# Patient Record
Sex: Female | Born: 1945 | Race: White | Hispanic: No | Marital: Married | Smoking: Never smoker
Health system: Southern US, Community
[De-identification: ages and names within clinical notes are randomized; demographics above are authoritative.]

## PROBLEM LIST (undated history)

## (undated) DIAGNOSIS — E7800 Pure hypercholesterolemia, unspecified: Secondary | ICD-10-CM

## (undated) DIAGNOSIS — C50919 Malignant neoplasm of unspecified site of unspecified female breast: Secondary | ICD-10-CM

## (undated) DIAGNOSIS — E119 Type 2 diabetes mellitus without complications: Secondary | ICD-10-CM

## (undated) DIAGNOSIS — I10 Essential (primary) hypertension: Secondary | ICD-10-CM

## (undated) DIAGNOSIS — K219 Gastro-esophageal reflux disease without esophagitis: Secondary | ICD-10-CM

## (undated) DIAGNOSIS — Z9221 Personal history of antineoplastic chemotherapy: Secondary | ICD-10-CM

## (undated) DIAGNOSIS — Z923 Personal history of irradiation: Secondary | ICD-10-CM

## (undated) DIAGNOSIS — Z950 Presence of cardiac pacemaker: Secondary | ICD-10-CM

## (undated) DIAGNOSIS — G4730 Sleep apnea, unspecified: Secondary | ICD-10-CM

## (undated) DIAGNOSIS — N189 Chronic kidney disease, unspecified: Secondary | ICD-10-CM

## (undated) DIAGNOSIS — M1990 Unspecified osteoarthritis, unspecified site: Secondary | ICD-10-CM

## (undated) DIAGNOSIS — D649 Anemia, unspecified: Secondary | ICD-10-CM

## (undated) DIAGNOSIS — K449 Diaphragmatic hernia without obstruction or gangrene: Secondary | ICD-10-CM

## (undated) DIAGNOSIS — E039 Hypothyroidism, unspecified: Secondary | ICD-10-CM

## (undated) HISTORY — PX: BREAST LUMPECTOMY: SHX2

## (undated) HISTORY — PX: CHOLECYSTECTOMY: SHX55

## (undated) HISTORY — PX: BACK SURGERY: SHX140

## (undated) HISTORY — PX: CATARACT EXTRACTION: SUR2

## (undated) HISTORY — PX: TONSILLECTOMY: SUR1361

## (undated) HISTORY — PX: MASTECTOMY: SHX3

## (undated) HISTORY — PX: TUBAL LIGATION: SHX77

---

## 1990-12-09 DIAGNOSIS — C50919 Malignant neoplasm of unspecified site of unspecified female breast: Secondary | ICD-10-CM

## 1990-12-09 DIAGNOSIS — D649 Anemia, unspecified: Secondary | ICD-10-CM

## 1990-12-09 HISTORY — DX: Malignant neoplasm of unspecified site of unspecified female breast: C50.919

## 1990-12-09 HISTORY — DX: Anemia, unspecified: D64.9

## 2015-05-24 ENCOUNTER — Other Ambulatory Visit: Payer: Self-pay

## 2015-05-24 DIAGNOSIS — Z1231 Encounter for screening mammogram for malignant neoplasm of breast: Secondary | ICD-10-CM

## 2015-06-26 ENCOUNTER — Ambulatory Visit
Admission: RE | Admit: 2015-06-26 | Discharge: 2015-06-26 | Disposition: A | Discharge status: Home / Self Care | Payer: Medicare Other | Source: Ambulatory Visit

## 2015-06-26 DIAGNOSIS — Z1231 Encounter for screening mammogram for malignant neoplasm of breast: Secondary | ICD-10-CM

## 2016-06-10 ENCOUNTER — Other Ambulatory Visit: Payer: Self-pay | Admitting: Internal Medicine

## 2016-06-10 DIAGNOSIS — Z1231 Encounter for screening mammogram for malignant neoplasm of breast: Secondary | ICD-10-CM

## 2016-06-10 DIAGNOSIS — Z853 Personal history of malignant neoplasm of breast: Secondary | ICD-10-CM

## 2016-07-01 ENCOUNTER — Ambulatory Visit
Admission: RE | Admit: 2016-07-01 | Discharge: 2016-07-01 | Disposition: A | Discharge status: Home / Self Care | Payer: Medicare Other | Source: Ambulatory Visit | Attending: Internal Medicine | Admitting: Internal Medicine

## 2016-07-01 DIAGNOSIS — Z853 Personal history of malignant neoplasm of breast: Secondary | ICD-10-CM

## 2016-07-01 DIAGNOSIS — Z1231 Encounter for screening mammogram for malignant neoplasm of breast: Secondary | ICD-10-CM

## 2016-07-03 ENCOUNTER — Emergency Department (HOSPITAL_COMMUNITY): Payer: Medicare Other

## 2016-07-03 ENCOUNTER — Encounter (HOSPITAL_COMMUNITY): Payer: Self-pay

## 2016-07-03 ENCOUNTER — Emergency Department (HOSPITAL_COMMUNITY)
Admission: EM | Admit: 2016-07-03 | Discharge: 2016-07-03 | Disposition: A | Discharge status: Home / Self Care | Payer: Medicare Other | Attending: Emergency Medicine | Admitting: Emergency Medicine

## 2016-07-03 DIAGNOSIS — K449 Diaphragmatic hernia without obstruction or gangrene: Secondary | ICD-10-CM

## 2016-07-03 DIAGNOSIS — R1310 Dysphagia, unspecified: Secondary | ICD-10-CM | POA: Diagnosis not present

## 2016-07-03 DIAGNOSIS — Z7984 Long term (current) use of oral hypoglycemic drugs: Secondary | ICD-10-CM | POA: Insufficient documentation

## 2016-07-03 DIAGNOSIS — E119 Type 2 diabetes mellitus without complications: Secondary | ICD-10-CM | POA: Diagnosis not present

## 2016-07-03 DIAGNOSIS — R0789 Other chest pain: Secondary | ICD-10-CM

## 2016-07-03 DIAGNOSIS — I10 Essential (primary) hypertension: Secondary | ICD-10-CM | POA: Diagnosis not present

## 2016-07-03 HISTORY — DX: Diaphragmatic hernia without obstruction or gangrene: K44.9

## 2016-07-03 HISTORY — DX: Pure hypercholesterolemia, unspecified: E78.00

## 2016-07-03 HISTORY — DX: Type 2 diabetes mellitus without complications: E11.9

## 2016-07-03 HISTORY — DX: Essential (primary) hypertension: I10

## 2016-07-03 HISTORY — DX: Sleep apnea, unspecified: G47.30

## 2016-07-03 LAB — CBC
HEMATOCRIT: 35.2 % — AB (ref 36.0–46.0)
HEMOGLOBIN: 11.5 g/dL — AB (ref 12.0–15.0)
MCH: 30.3 pg (ref 26.0–34.0)
MCHC: 32.7 g/dL (ref 30.0–36.0)
MCV: 92.9 fL (ref 78.0–100.0)
Platelets: 249 10*3/uL (ref 150–400)
RBC: 3.79 MIL/uL — AB (ref 3.87–5.11)
RDW: 12.7 % (ref 11.5–15.5)
WBC: 10 10*3/uL (ref 4.0–10.5)

## 2016-07-03 LAB — BASIC METABOLIC PANEL
ANION GAP: 8 (ref 5–15)
BUN: 31 mg/dL — AB (ref 6–20)
CHLORIDE: 103 mmol/L (ref 101–111)
CO2: 22 mmol/L (ref 22–32)
Calcium: 9.1 mg/dL (ref 8.9–10.3)
Creatinine, Ser: 1.61 mg/dL — ABNORMAL HIGH (ref 0.44–1.00)
GFR calc Af Amer: 36 mL/min — ABNORMAL LOW (ref >60)
GFR calc non Af Amer: 31 mL/min — ABNORMAL LOW (ref >60)
Glucose, Bld: 171 mg/dL — ABNORMAL HIGH (ref 65–99)
POTASSIUM: 4.6 mmol/L (ref 3.5–5.1)
SODIUM: 133 mmol/L — AB (ref 135–145)

## 2016-07-03 LAB — I-STAT TROPONIN, ED: Troponin i, poc: 0 ng/mL (ref 0.00–0.08)

## 2016-07-03 MED ORDER — PANTOPRAZOLE SODIUM 40 MG PO TBEC: 40.0000 mg | DELAYED_RELEASE_TABLET | Freq: Two times a day (BID) | ORAL | 0 refills | Status: DC

## 2016-07-03 NOTE — ED Notes (Signed)
Pt remains in xray.

## 2016-07-03 NOTE — ED Provider Notes (Signed)
I have reviewed the findings with Dr. Manya Silvas. The patient is to call [facility] in the morning and they will arrange to see the patient within 1-2 days for recheck. The patient is aware of the plan and agreeable. She is aware of signs and symptoms for which return. I prescribed for Protonix 40 mg twice a day.   Charlesetta Shanks, MD 07/03/16 [phone]

## 2016-07-03 NOTE — ED Notes (Signed)
Pt back from x-ray.

## 2016-07-03 NOTE — ED Provider Notes (Signed)
[facility] DEPT Provider Note   CSN: [id number] Arrival date & time: 07/03/16  1121  First Provider Contact:  First MD Initiated Contact with Patient 07/03/16 1250        History   Chief Complaint Chief Complaint  Patient presents with  . Chest Pain    HPI Kristin [name] is a 70 y.o. female.  HPI Patient presents emergency department with complaints of a sensation of fullness in her anterior chest as well as discomfort only when she swallows both liquids and solids over the past several days.  She was evaluated in urgent care and they're concerned about the possibility of an abnormal EKG and thus they sent her to the emergency department for further evaluation.  Patient does have a history of diabetes and hypertension as well as hyperlipidemia but no known history of coronary artery disease.  She does have a known hiatal hernia but reports she's never been very symptomatic from this.  She reports that her discomfort is only present when she swallows.  She has no exertional chest pain or shortness of breath.  She denies nausea vomiting.  No recent injury or trauma.  She denies fevers and chills.  Denies abdominal pain.  She's never had an endoscopy.  She's never had an esophageal dilation.  When she was initially told that she had a hiatal hernia she was started on Protonix which at the time did improve the minimal symptoms she had.   Past Medical History:  Diagnosis Date  . Diabetes mellitus without complication ([place])   . Hiatal hernia   . High cholesterol   . Hypertension   . Sleep apnea     There are no active problems to display for this patient.   History reviewed. No pertinent surgical history.  OB History    No data available       Home Medications    Prior to Admission medications   Medication Sig Start Date End Date Taking? Authorizing Provider  acetaminophen (TYLENOL) 325 MG tablet Take 650 mg by mouth every 6 (six) hours as needed for headache (pain).   Yes  Historical Provider, MD  amLODipine (NORVASC) 5 MG tablet Take 5 mg by mouth daily.   Yes Historical Provider, MD  atorvastatin (LIPITOR) 40 MG tablet Take 40 mg by mouth daily.   Yes Historical Provider, MD  cholecalciferol (VITAMIN D) 400 units TABS tablet Take 400 Units by mouth daily.   Yes Historical Provider, MD  fluticasone (FLONASE) 50 MCG/ACT nasal spray Place 1 spray into both nostrils daily.   Yes Historical Provider, MD  furosemide (LASIX) 20 MG tablet Take 10 mg by mouth daily as needed for edema.   Yes Historical Provider, MD  gabapentin (NEURONTIN) 300 MG capsule Take 600 mg by mouth at bedtime.   Yes Historical Provider, MD  lisinopril (PRINIVIL,ZESTRIL) 30 MG tablet Take 30 mg by mouth 2 (two) times daily.   Yes Historical Provider, MD  metFORMIN (GLUCOPHAGE) 500 MG tablet Take 1,000 mg by mouth 2 (two) times daily with a meal.   Yes Historical Provider, MD  metoprolol (LOPRESSOR) 50 MG tablet Take 50 mg by mouth 2 (two) times daily.   Yes Historical Provider, MD  Multiple Vitamins-Minerals (MULTIVITAMIN ADULT PO) Take by mouth.   Yes Historical Provider, MD  pantoprazole (PROTONIX) 40 MG tablet Take 40 mg by mouth daily.   Yes Historical Provider, MD  sitaGLIPtin (JANUVIA) 25 MG tablet Take 25 mg by mouth daily.   Yes Historical Provider, MD  zolpidem (AMBIEN) 10 MG tablet Take 10 mg by mouth at bedtime.   Yes Historical Provider, MD    Family History No family history on file.  Social History Social History  Substance Use Topics  . Smoking status: Never Smoker  . Smokeless tobacco: Never Used  . Alcohol use Not on file     Allergies   Ibuprofen and Levodopa   Review of Systems Review of Systems  All other systems reviewed and are negative.    Physical Exam Updated Vital Signs BP 131/75   Pulse 73   Temp 98.9 F (37.2 C) (Oral)   Resp 18   Ht 5\' 5"  (1.651 m)   Wt 269 lb (122 kg)   SpO2 99%   BMI 44.76 kg/m   Physical Exam  Constitutional: She is  oriented to person, place, and time. She appears well-developed and well-nourished. No distress.  HENT:  Head: Normocephalic and atraumatic.  Eyes: EOM are normal.  Neck: Normal range of motion.  Cardiovascular: Normal rate, regular rhythm and normal heart sounds.   Pulmonary/Chest: Effort normal and breath sounds normal.  Abdominal: Soft. She exhibits no distension. There is no tenderness.  Musculoskeletal: Normal range of motion.  Neurological: She is alert and oriented to person, place, and time.  Skin: Skin is warm and dry.  Psychiatric: She has a normal mood and affect. Judgment normal.  Nursing note and vitals reviewed.    ED Treatments / Results  Labs (all labs ordered are listed, but only abnormal results are displayed) Labs Reviewed  BASIC METABOLIC PANEL - Abnormal; Notable for the following:       Result Value   Sodium 133 (*)    Glucose, Bld 171 (*)    BUN 31 (*)    Creatinine, Ser 1.61 (*)    GFR calc non Af Amer 31 (*)    GFR calc Af Amer 36 (*)    All other components within normal limits  CBC - Abnormal; Notable for the following:    RBC 3.79 (*)    Hemoglobin 11.5 (*)    HCT 35.2 (*)    All other components within normal limits  I-STAT TROPOININ, ED    EKG  EKG Interpretation  Date/Time:  Wednesday July 03 2016 11:47:16 EDT Ventricular Rate:  86 PR Interval:  208 QRS Duration: 126 QT Interval:  386 QTC Calculation: 461 R Axis:   -48 Text Interpretation:  Sinus rhythm with occasional Premature ventricular complexes Left axis deviation Left ventricular hypertrophy with QRS widening Nonspecific T wave abnormality Abnormal ECG No old tracing to compare Confirmed by Lenka Zhao  MD, Lennette Bihari ([id number]) on 07/03/2016 12:48:47 PM       Radiology Dg Chest 2 View  Result Date: 07/03/2016 CLINICAL DATA:  Chest tightness for 24 hours. EXAM: CHEST  2 VIEW COMPARISON:  None. FINDINGS: Asymmetric elevation right hemidiaphragm. The lungs are clear wiithout focal  pneumonia, edema, pneumothorax or pleural effusion. Tiny nodule seen over the left upper lobemay be a granuloma given the apparent density relative to its size. The cardiopericardial silhouette is within normal limits for size. The visualized bony structures of the thorax are intact. IMPRESSION: Left upper lobe pulmonary nodule may be a granuloma. CT chest without contrast recommended to confirm. Electronically Signed   By: Misty Stanley M.D.   On: 07/03/2016 12:37  Ct Chest Wo Contrast  Result Date: 07/03/2016 CLINICAL DATA:  Lung nodule seen on prior chest x-ray. EXAM: CT CHEST WITHOUT CONTRAST TECHNIQUE: Multidetector CT imaging  of the chest was performed following the standard protocol without IV contrast. COMPARISON:  Radiograph of same day. FINDINGS: Cardiovascular: Coronary artery calcifications are noted. Normal cardiac size. Mediastinum/Nodes: No mediastinal mass or adenopathy is noted. Lungs/Pleura: No pneumothorax or pleural effusion is noted. No acute pulmonary disease is noted. Small calcified granulomata are noted bilaterally, with the largest in the left upper lobe which corresponds to abnormality seen on prior chest radiograph. Upper Abdomen: Large hiatal hernia is noted. Status post cholecystectomy. 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 IMPRESSION: Small calcified granulomata are noted bilaterally, with the largest in a left upper lobe which corresponds to abnormality seen on prior chest radiograph. Large hiatal hernia is noted. Coronary artery calcifications are noted suggesting Coronary artery disease. No other abnormality seen in the chest. Electronically Signed   By: Marijo Conception, M.D.   On: 07/03/2016 13:58   Procedures Procedures (including critical care time)  Medications Ordered in ED Medications - No data to display   Initial Impression / Assessment and Plan / ED Course  I have reviewed the triage vital signs and the nursing  notes.  Pertinent labs & imaging results that were available during my care of the patient were reviewed by me and considered in my medical decision making (see chart for details).  Clinical Course   Overall the patient is well-appearing.  I have a very low suspicion that this is acute coronary syndrome.  This sounds to be more of an esophageal issue as the pain and discomfort only comes with swallowing.  I had her drink water in front of me and as she was swallowing the water she felt discomfort which she points to the mid part of her sternum which then resolved 1-2 seconds later after she felt the fluid go down.  This sounds like this may be esophagitis versus a developing esophageal stricture.  This also could definitely represent a symptomatic hiatal hernia.  On initial plain film of her chest a small pulmonary nodule was noted and she does have a history of left-sided breast cancer treated back in the early 90s.  Because of this a CT scan of her chest was performed which demonstrates a large hiatal hernia but otherwise a granuloma which corresponds pulmonary nodule noted on chest x-ray.  No other adenopathy was noted.  Patient will undergo esophagram in the emergency department for further evaluation as this will better define what her diet will need to be until she can follow-up with GI.  For esophagus is wide open she can have a normal diet in the discomfort and pain is likely more coming from a symptomatic hiatal hernia.  She'll be given outpatient follow-up for surgery as well as for GI.  She does have a primary care physician in the community.  Overall she is very well-appearing.  She is able to keep down fluids without difficulty in the emergency department  Care to Dr Johnney Killian to follow up on Esophagram. Family updated  Final Clinical Impressions(s) / ED Diagnoses   Final diagnoses:  Dysphagia    New Prescriptions New Prescriptions   No medications on file     Jola Schmidt,  MD 07/03/16 1712

## 2016-07-03 NOTE — ED Notes (Signed)
Patient transported to CT 

## 2016-07-03 NOTE — ED Notes (Signed)
Wheeled pt back to room from waiting room. 

## 2016-07-03 NOTE — ED Notes (Signed)
Pt ambulated to restroom from room, pt tolerated well.

## 2016-07-03 NOTE — ED Notes (Signed)
Patient able to ambulate independently  

## 2017-06-04 ENCOUNTER — Encounter (HOSPITAL_BASED_OUTPATIENT_CLINIC_OR_DEPARTMENT_OTHER): Payer: Self-pay | Admitting: *Deleted

## 2017-06-04 ENCOUNTER — Emergency Department (HOSPITAL_BASED_OUTPATIENT_CLINIC_OR_DEPARTMENT_OTHER): Payer: Medicare Other

## 2017-06-04 ENCOUNTER — Emergency Department (HOSPITAL_BASED_OUTPATIENT_CLINIC_OR_DEPARTMENT_OTHER)
Admission: EM | Admit: 2017-06-04 | Discharge: 2017-06-05 | Disposition: A | Discharge status: Home / Self Care | Payer: Medicare Other | Attending: Emergency Medicine | Admitting: Emergency Medicine

## 2017-06-04 DIAGNOSIS — Z79899 Other long term (current) drug therapy: Secondary | ICD-10-CM | POA: Insufficient documentation

## 2017-06-04 DIAGNOSIS — S00511A Abrasion of lip, initial encounter: Secondary | ICD-10-CM | POA: Insufficient documentation

## 2017-06-04 DIAGNOSIS — E119 Type 2 diabetes mellitus without complications: Secondary | ICD-10-CM | POA: Diagnosis not present

## 2017-06-04 DIAGNOSIS — Y92520 Airport as the place of occurrence of the external cause: Secondary | ICD-10-CM | POA: Insufficient documentation

## 2017-06-04 DIAGNOSIS — Z853 Personal history of malignant neoplasm of breast: Secondary | ICD-10-CM | POA: Insufficient documentation

## 2017-06-04 DIAGNOSIS — I10 Essential (primary) hypertension: Secondary | ICD-10-CM | POA: Diagnosis not present

## 2017-06-04 DIAGNOSIS — Y999 Unspecified external cause status: Secondary | ICD-10-CM | POA: Insufficient documentation

## 2017-06-04 DIAGNOSIS — W010XXA Fall on same level from slipping, tripping and stumbling without subsequent striking against object, initial encounter: Secondary | ICD-10-CM | POA: Insufficient documentation

## 2017-06-04 DIAGNOSIS — S59911A Unspecified injury of right forearm, initial encounter: Secondary | ICD-10-CM | POA: Diagnosis present

## 2017-06-04 DIAGNOSIS — Y939 Activity, unspecified: Secondary | ICD-10-CM | POA: Insufficient documentation

## 2017-06-04 DIAGNOSIS — T1490XA Injury, unspecified, initial encounter: Secondary | ICD-10-CM

## 2017-06-04 DIAGNOSIS — Z7984 Long term (current) use of oral hypoglycemic drugs: Secondary | ICD-10-CM | POA: Diagnosis not present

## 2017-06-04 DIAGNOSIS — S52121A Displaced fracture of head of right radius, initial encounter for closed fracture: Secondary | ICD-10-CM | POA: Insufficient documentation

## 2017-06-04 HISTORY — DX: Malignant neoplasm of unspecified site of unspecified female breast: C50.919

## 2017-06-04 NOTE — ED Triage Notes (Signed)
[place] with right upper arm pain  X 1 hr ago

## 2017-06-05 DIAGNOSIS — S52121A Displaced fracture of head of right radius, initial encounter for closed fracture: Secondary | ICD-10-CM | POA: Diagnosis not present

## 2017-06-05 IMAGING — CT CT CHEST W/O CM
2 of 4 series · 15 of 36 positions shown, 18 images · non-contrast
Comparison: Radiograph of same day.

CLINICAL DATA: Lung nodule seen on prior chest x-ray.

EXAM:
CT CHEST WITHOUT CONTRAST
TECHNIQUE: Multidetector CT imaging of the chest was performed following the
standard protocol without IV contrast.

[Series 3: thorax 2.0 · axial · 0.90mm/px · z∈[-262,+44]mm · 12 of 173 slices shown, 15 images]
[im 10/173  mediastinal]
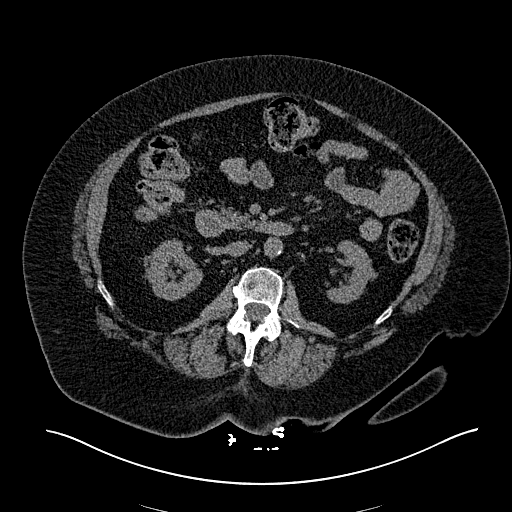
[im 10/173  lung]
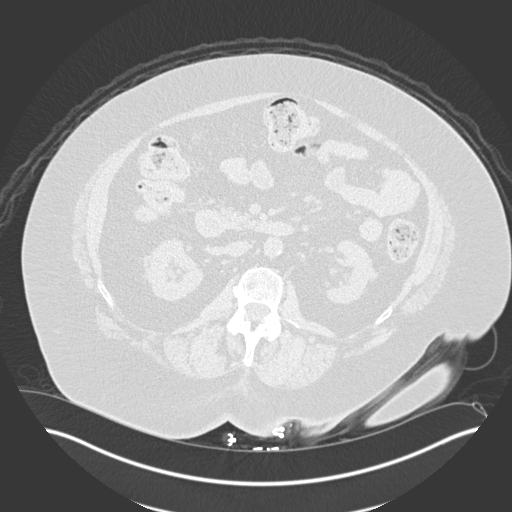
[im 28/173  lung]
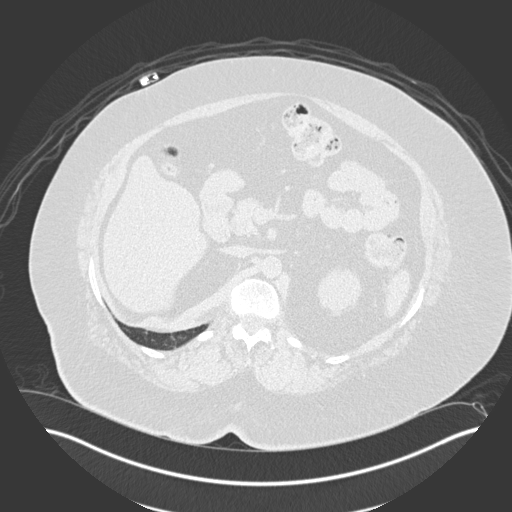
[im 37/173  lung]
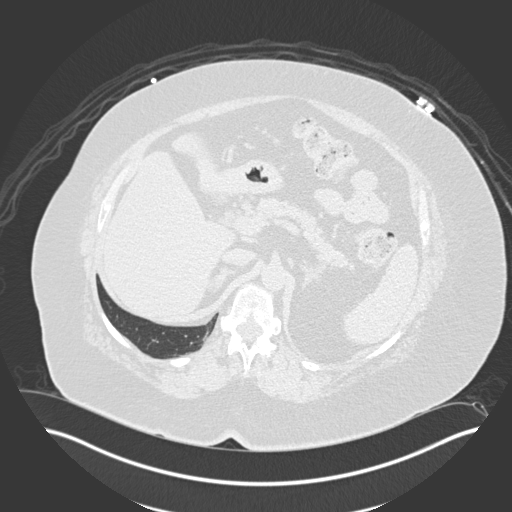
[im 55/173  lung]
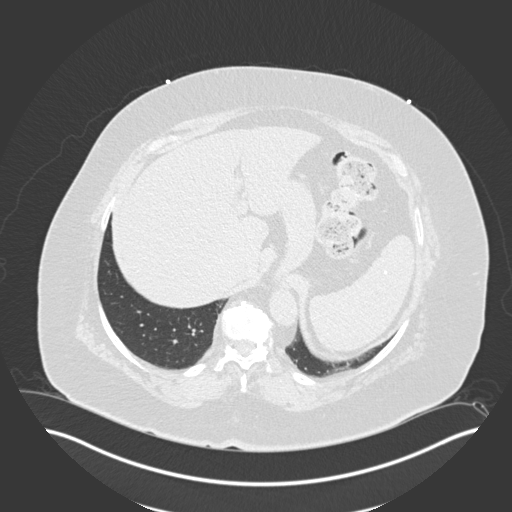
[im 64/173  mediastinal]
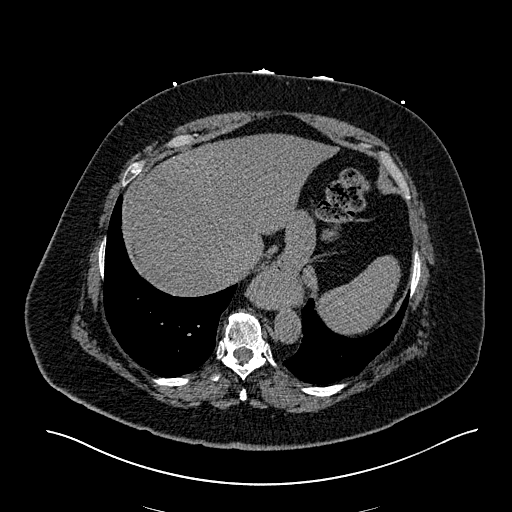
[im 64/173  lung]
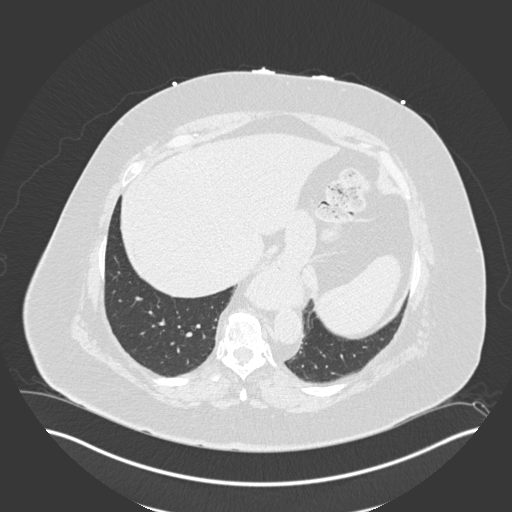
[im 82/173  lung]
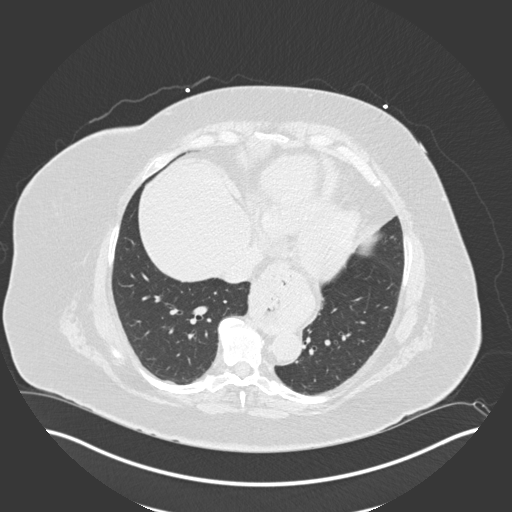
[im 91/173  lung]
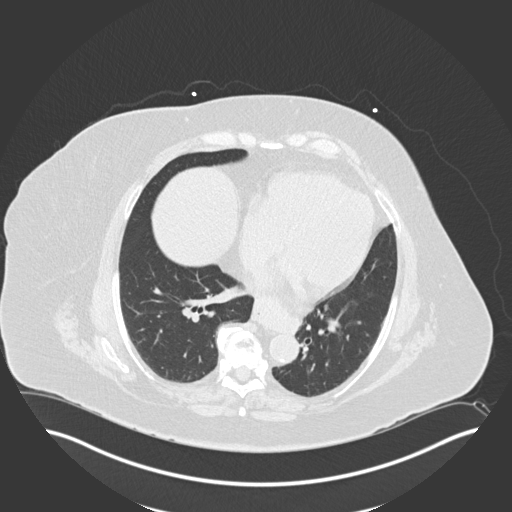
[im 109/173  lung]
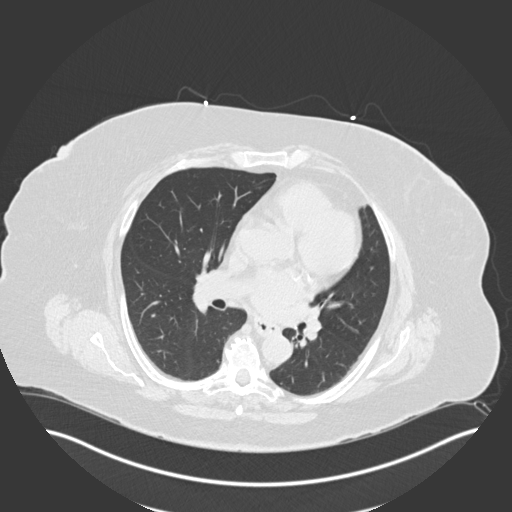
[im 118/173  mediastinal]
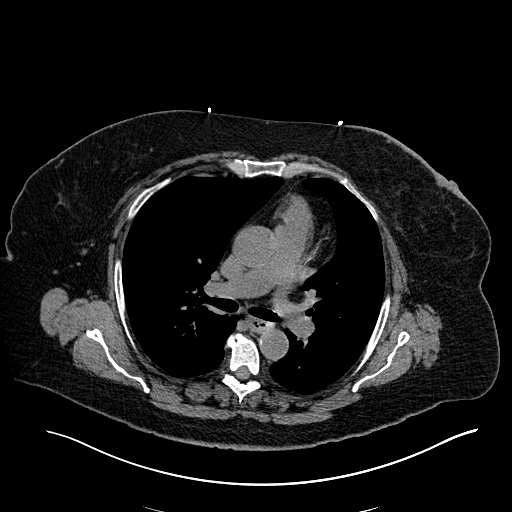
[im 118/173  lung]
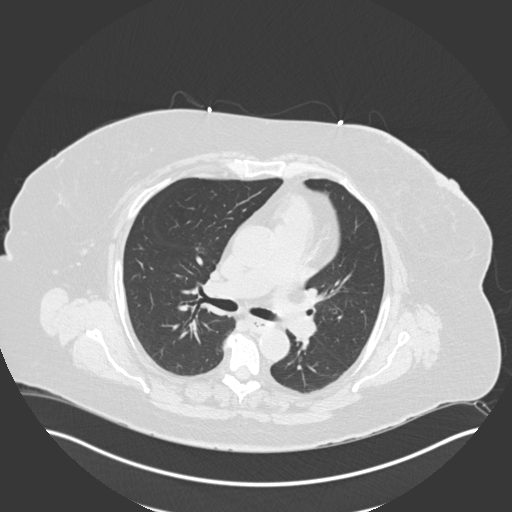
[im 136/173  lung]
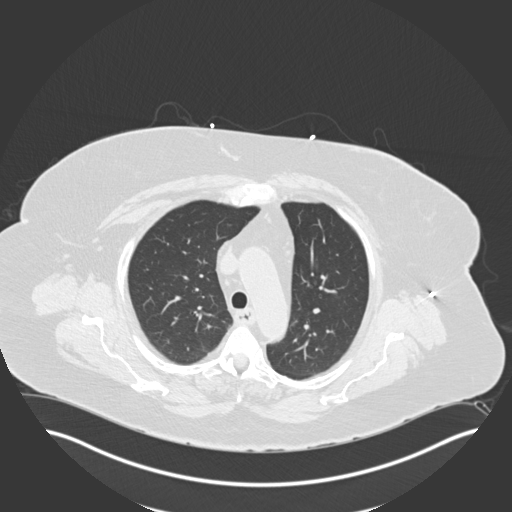
[im 145/173  lung]
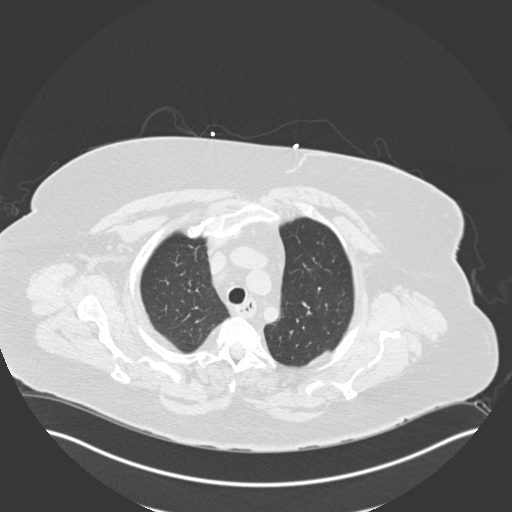
[im 163/173  lung]
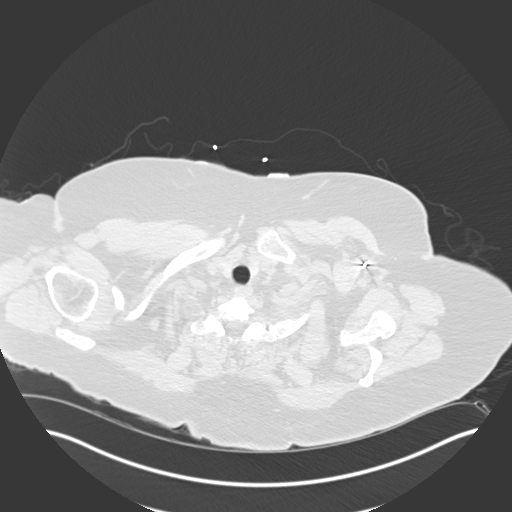

[Series 5: coronal · coronal · 0.68mm/px · 3 of 101 slices shown]
[im 21/101  lung]
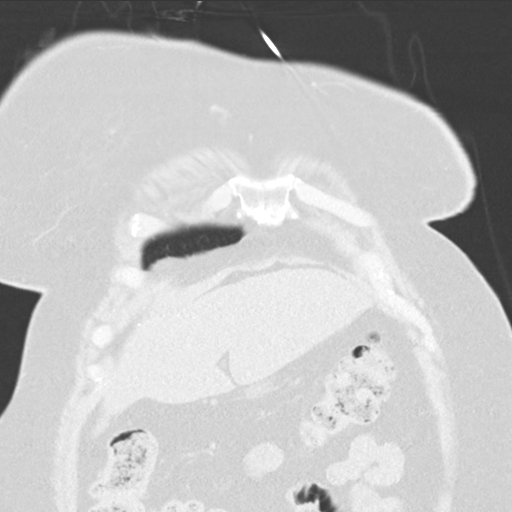
[im 41/101  lung]
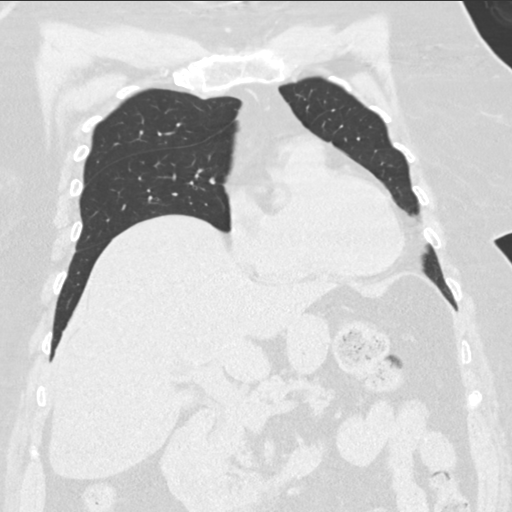
[im 61/101  lung]
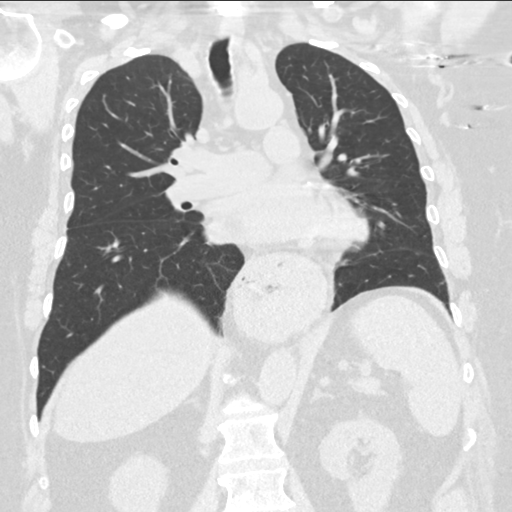

[15 of 36 positions shown; findings below may reference images not displayed]

FINDINGS: Cardiovascular: Coronary artery calcifications are noted. Normal
cardiac size.

Mediastinum/Nodes: No mediastinal mass or adenopathy is noted.

Lungs/Pleura: No pneumothorax or pleural effusion is noted. No acute
pulmonary disease is noted. Small calcified granulomata are noted
bilaterally, with the largest in the left upper lobe which
corresponds to abnormality seen on prior chest radiograph.

Upper Abdomen: Large hiatal hernia is noted. Status post
cholecystectomy.

Musculoskeletal: Multilevel degenerative disc disease is noted in
the thoracic spine.
IMPRESSION: Small calcified granulomata are noted bilaterally, with the largest
in a left upper lobe which corresponds to abnormality seen on prior
chest radiograph.

Large hiatal hernia is noted.

Coronary artery calcifications are noted suggesting Coronary artery
disease.

No other abnormality seen in the chest.

## 2017-06-05 MED ORDER — HYDROCODONE-ACETAMINOPHEN 5-325 MG PO TABS: 1.0000 | ORAL_TABLET | Freq: Four times a day (QID) | ORAL | 0 refills | Status: DC | PRN

## 2017-06-05 MED ORDER — HYDROCODONE-ACETAMINOPHEN 5-325 MG PO TABS
1.0000 | ORAL_TABLET | Freq: Once | ORAL | Status: AC
  Administered 2017-06-05: 1 via ORAL
  Filled 2017-06-05: qty 1

## 2017-06-05 NOTE — ED Provider Notes (Signed)
[facility] Provider Note: Georgena Spurling, MD, FACEP  CSN: [id number] MRN: [id number] ARRIVAL: 06/04/17 at 2328 ROOM: [place]  Arm Injury   HISTORY OF PRESENT ILLNESS  Kristin [name] is a 71 y.o. female who [place] and fell at the airport about an hour prior to arrival. She fell onto her right arm. She is having 6 out of 10 pain in her right arm just proximal to the right elbow. Pain is worse with palpation or movement, most notably supination and pronation. She denies wrist pain. Her only other injury is a superficial abrasion to her mid upper lip.   Past Medical History:  Diagnosis Date  . Breast CA ([place])   . Diabetes mellitus without complication ([place])   . Hiatal hernia   . High cholesterol   . Hypertension   . Sleep apnea     Past Surgical History:  Procedure Laterality Date  . BACK SURGERY    . BREAST LUMPECTOMY    . CHOLECYSTECTOMY    . TONSILLECTOMY    . TUBAL LIGATION      History reviewed. No pertinent family history.  Social History  Substance Use Topics  . Smoking status: Never Smoker  . Smokeless tobacco: Never Used  . Alcohol use No    Prior to Admission medications   Medication Sig Start Date End Date Taking? Authorizing Provider  acetaminophen (TYLENOL) 325 MG tablet Take 650 mg by mouth every 6 (six) hours as needed for headache (pain).    [provider]  amLODipine (NORVASC) 5 MG tablet Take 5 mg by mouth daily.    [provider]  atorvastatin (LIPITOR) 40 MG tablet Take 40 mg by mouth daily.    [provider]  cholecalciferol (VITAMIN D) 400 units TABS tablet Take 400 Units by mouth daily.    [provider]  fluticasone (FLONASE) 50 MCG/ACT nasal spray Place 1 spray into both nostrils daily.    [provider]  furosemide (LASIX) 20 MG tablet Take 10 mg by mouth daily as needed for edema.    [provider]  gabapentin (NEURONTIN) 300 MG capsule Take 600 mg by  mouth at bedtime.    [provider]  lisinopril (PRINIVIL,ZESTRIL) 30 MG tablet Take 30 mg by mouth 2 (two) times daily.    [provider]  metFORMIN (GLUCOPHAGE) 500 MG tablet Take 1,000 mg by mouth 2 (two) times daily with a meal.    [provider]  metoprolol (LOPRESSOR) 50 MG tablet Take 50 mg by mouth 2 (two) times daily.    [provider]  Multiple Vitamins-Minerals (MULTIVITAMIN ADULT PO) Take by mouth.    [provider]  pantoprazole (PROTONIX) 40 MG tablet Take 1 tablet (40 mg total) by mouth 2 (two) times daily. 07/03/16   Charlesetta Shanks, MD  sitaGLIPtin (JANUVIA) 25 MG tablet Take 25 mg by mouth daily.    [provider]  zolpidem (AMBIEN) 10 MG tablet Take 10 mg by mouth at bedtime.    [provider]    Allergies Ibuprofen and Levodopa   REVIEW OF SYSTEMS  Negative except as noted here or in the History of Present Illness.   PHYSICAL EXAMINATION  Initial Vital Signs Blood pressure (!) 147/91, pulse (!) 107, temperature 98.2 F (36.8 C), temperature source Oral, resp. rate 20, height 5\' 5"  (1.651 m), weight 117.9 kg (260 lb), SpO2 95 %.  Examination General: Well-developed, well-nourished female in no acute distress; appearance  consistent with age of record HENT: normocephalic; superficial abrasion to mid upper lip Eyes: pupils equal, round and reactive to light; extraocular muscles intact; lens implants Neck: supple; no C-spine tenderness Heart: regular rate and rhythm Lungs: clear to auscultation bilaterally Abdomen: soft; nondistended; nontender; bowel sounds present Extremities: No deformity; full range of motion except right elbow and forearm; pulses normal; +1 pitting edema of lower legs; pain on movement of right elbow, notably supination and pronation; tenderness of proximal right elbow; right upper extremity distally neurovascularly intact Neurologic: Awake, alert and oriented; motor function  intact in all extremities and symmetric; no facial droop Skin: Warm and dry Psychiatric: Normal mood and affect   RESULTS  Summary of this visit's results, reviewed by myself:   EKG Interpretation  Date/Time:    Ventricular Rate:    PR Interval:    QRS Duration:   QT Interval:    QTC Calculation:   R Axis:     Text Interpretation:        Laboratory Studies: No results found for this or any previous visit (from the past 24 hour(s)). Imaging Studies: Dg Elbow Complete Right (3+view)  Result Date: 06/05/2017 CLINICAL DATA:  71 y/o F; initial encounter. Status post fall with elbow and wrist pain. EXAM: RIGHT ELBOW - COMPLETE 3+ VIEW COMPARISON:  None. FINDINGS: Mildly displaced and impacted acute radial head fracture. Small elbow joint effusion. No other fracture or dislocation identified. IMPRESSION: Mildly displaced and impacted acute radial head fracture. Small elbow joint effusion. No other fracture or dislocation identified. Electronically Signed   By: Kristine Garbe M.D.   On: 06/05/2017 00:30   Dg Wrist Complete Right  Result Date: 06/05/2017 CLINICAL DATA:  Persistent pain after falling at the airport today. EXAM: RIGHT WRIST - COMPLETE 3+ VIEW COMPARISON:  None. FINDINGS: Negative for acute fracture or dislocation. No radiopaque foreign body. Severe arthritic changes at the first carpometacarpal articulation. IMPRESSION: Negative for acute fracture.  Severe first CMC arthritis. Electronically Signed   By: Andreas Newport M.D.   On: 06/05/2017 00:13    ED COURSE  Nursing notes and initial vitals signs, including pulse oximetry, reviewed.  Vitals:   06/04/17 2331  BP: (!) 147/91  Pulse: (!) 107  Resp: 20  Temp: 98.2 F (36.8 C)  TempSrc: Oral  SpO2: 95%  Weight: 117.9 kg (260 lb)  Height: 5\' 5"  (1.651 m)    PROCEDURES    ED DIAGNOSES     ICD-10-CM   1. Fall from slip, trip, or stumble, initial encounter W01.0XXA   2. Injury T14.90XA DG ELBOW  COMPLETE RIGHT (3+VIEW)    DG ELBOW COMPLETE RIGHT (3+VIEW)  3. Closed displaced fracture of head of right radius, initial encounter S52.121A   4. Abrasion of lip, initial encounter S00.511A        Michelene Keniston, Jenny Reichmann, MD 06/05/17 [phone]

## 2017-06-05 NOTE — ED Notes (Signed)
PMS intact before and after. Pt tolerated well. All questions answered. 

## 2017-06-09 ENCOUNTER — Ambulatory Visit (INDEPENDENT_AMBULATORY_CARE_PROVIDER_SITE_OTHER): Payer: Medicare Other | Admitting: Orthopaedic Surgery

## 2017-06-09 DIAGNOSIS — S52124A Nondisplaced fracture of head of right radius, initial encounter for closed fracture: Secondary | ICD-10-CM

## 2017-06-09 MED ORDER — HYDROCODONE-ACETAMINOPHEN 5-325 MG PO TABS: 1.0000 | ORAL_TABLET | Freq: Four times a day (QID) | ORAL | 0 refills | Status: DC | PRN

## 2017-06-09 NOTE — Progress Notes (Signed)
   Office Visit Note   Patient: Kristin [name]           Date of Birth: [birth year]           MRN: [id number] Visit Date: 06/09/2017              Requested by: Javier Glazier, MD No address on file PCP: Javier Glazier, MD   Assessment & Plan: Visit Diagnoses:  1. Closed nondisplaced fracture of head of right radius, initial encounter     Plan: Put her in a new splint today. I will see her back in just one week and will have the splint removed and then I would like an AP oblique and lateral of her right elbow for 3 views of the right elbow. All questions were asked and answered. She understands and we will discuss back with a sling after the x-rays. He did give her prescription for hydrocodone today.  Follow-Up Instructions: Return in about 1 week (around 06/16/2017).   Orders:  No orders of the defined types were placed in this encounter.  No orders of the defined types were placed in this encounter.     Procedures: No procedures performed   Clinical Data: No additional findings.   Subjective: No chief complaint on file. The patient is a very pleasant 71 year old right-hand-dominant female referred from the emergency room after mechanical fall injuring her right elbow. She was found to have a radial head/neck fracture of the right side and placed probably a posterior splint. This is her first follow-up since then. She is boarding some wrist and forearm pain but otherwise is been a sling and doing well. She denies any numbness and tingling in her hand.  HPI  Review of Systems She denies any headache, chest pain, short of breath, fever, chills, nausea, vomiting.  Objective: Vital Signs: There were no vitals taken for this visit.  Physical Exam She is alert and oriented 3 in no acute distress Ortho Exam On examination the splint was not fitting well's are removed. She has a significant amount of pain forearm but I cannot feel any step off there. The elbow has limited  mobility secondary to pain to be expected. Hand is well-perfused and neurovascularly intact. Specialty Comments:  No specialty comments available.  Imaging: No results found. X-rays in the family reviewed of her right elbow show a nondisplaced radial head/neck fracture with a well located elbow. I see no evidence of injury or fractures to the forearm on the right side or the elbow on the right side.  PMFS History: Patient Active Problem List   Diagnosis Date Noted  . Closed nondisplaced fracture of head of right radius 06/09/2017   Past Medical History:  Diagnosis Date  . Breast CA ([place])   . Diabetes mellitus without complication ([place])   . Hiatal hernia   . High cholesterol   . Hypertension   . Sleep apnea     No family history on file.  Past Surgical History:  Procedure Laterality Date  . BACK SURGERY    . BREAST LUMPECTOMY    . CHOLECYSTECTOMY    . TONSILLECTOMY    . TUBAL LIGATION     Social History   Occupational History  . Not on file.   Social History Main Topics  . Smoking status: Never Smoker  . Smokeless tobacco: Never Used  . Alcohol use No  . Drug use: No  . Sexual activity: Not on file

## 2017-06-19 ENCOUNTER — Ambulatory Visit (INDEPENDENT_AMBULATORY_CARE_PROVIDER_SITE_OTHER): Payer: Medicare Other

## 2017-06-19 ENCOUNTER — Ambulatory Visit (INDEPENDENT_AMBULATORY_CARE_PROVIDER_SITE_OTHER): Payer: Medicare Other | Admitting: Orthopaedic Surgery

## 2017-06-19 DIAGNOSIS — S52124D Nondisplaced fracture of head of right radius, subsequent encounter for closed fracture with routine healing: Secondary | ICD-10-CM | POA: Diagnosis not present

## 2017-06-19 NOTE — Progress Notes (Signed)
The patient is 2 weeks into a nondisplaced right elbow radial head fracture. We have had her no splint. She is doing much better in terms of pain.  Out of splint I can gently put her right elbow through internal and a short rotation as well as flexion extension and she does have some appropriate pain but elbow feels stable. Her forearm pain and hand pain are less. Her radial nerve function is normal.  2 views of her elbow obtained and you can see that the radial head fracture is present and remains nondisplaced to minimally displaced.  At this point she can be out of her splint. She'll wear the sling for the next week for comfort only. She'll come in and out of it to work on some gentle range of motion. I like to see her back in 3 weeks and a final AP lateral and oblique for 3 views of the right elbow. All questions were encouraged and answered.

## 2017-07-10 ENCOUNTER — Ambulatory Visit (INDEPENDENT_AMBULATORY_CARE_PROVIDER_SITE_OTHER): Payer: Medicare Other

## 2017-07-10 ENCOUNTER — Ambulatory Visit (INDEPENDENT_AMBULATORY_CARE_PROVIDER_SITE_OTHER): Payer: Medicare Other | Admitting: Orthopaedic Surgery

## 2017-07-10 DIAGNOSIS — S52124D Nondisplaced fracture of head of right radius, subsequent encounter for closed fracture with routine healing: Secondary | ICD-10-CM | POA: Diagnosis not present

## 2017-07-10 DIAGNOSIS — M25562 Pain in left knee: Secondary | ICD-10-CM

## 2017-07-10 DIAGNOSIS — G8929 Other chronic pain: Secondary | ICD-10-CM

## 2017-07-10 DIAGNOSIS — M1712 Unilateral primary osteoarthritis, left knee: Secondary | ICD-10-CM | POA: Diagnosis not present

## 2017-07-10 NOTE — Progress Notes (Signed)
Office Visit Note   Patient: Kristin [name]           Date of Birth: 1946-01-17           MRN: [id number] Visit Date: 07/10/2017              Requested by: Javier Glazier, MD No address on file PCP: Javier Glazier, MD   Assessment & Plan: Visit Diagnoses:  1. Closed nondisplaced fracture of head of right radius with routine healing   2. Chronic pain of left knee   3. Acute pain of left knee   4. Unilateral primary osteoarthritis, left knee     Plan: She understands that she has severe arthritis in her left knee and at some point may need a knee replacement. We'll see her back in a month because she'll have had a new hemoglobin A1c drawn and we can consider another steroid injection more safely in her left knee at that visit. She'll stop her sling at this point and use her right elbow as comfort allows but no heavy lifting. We'll see her back in 4 weeks I do not need to re-x-ray the elbow. All questions were encouraged and answered.  Follow-Up Instructions: Return in about 4 weeks (around 08/07/2017).   Orders:  Orders Placed This Encounter  Procedures  . XR Elbow 2 Views Right  . XR Knee 1-2 Views Left   No orders of the defined types were placed in this encounter.     Procedures: No procedures performed   Clinical Data: No additional findings.   Subjective: No chief complaint on file. The patient is here for follow-up of her right elbow 5-6 weeks into her radial head fracture that is nondisplaced. She is significant forearm pain as well. She was in a splint first and I would just have her using a sling coming in and out of the sling as comfort allows. She like me to see her left knee. She had an injection in this knee by someone else in late May. She is a diabetic and is been told she has "bone on bone wear" of her left knee. She hurts on the medial joint line and this is worse since she fell when she injured her right elbow.  HPI  Review of Systems She  currently denies any headache, chest pain, short of breath, fever, chills, nausea, vomiting.  Objective: Vital Signs: There were no vitals taken for this visit.  Physical Exam She is alert and oriented 3 in no acute distress Ortho Exam Examination of her right elbow is improved in terms of almost full motion with only mild pain. Her forearm pain is less as well. Her wrist on the right side feels stable on exam. As far as her left knee goes, she has medial joint line tenderness and patellofemoral crepitation. The knee is ligamentously stable.   Specialty Comments:  No specialty comments available.  Imaging: Xr Knee 1-2 Views Left  Result Date: 07/10/2017 2 views left knee show severe tricompartmental arthritic changes with complete loss of the medial joint line. There is periarticular osteophytes throughout the knee as well as sclerotic changes and varus malalignment.  Xr Elbow 2 Views Right  Result Date: 07/10/2017 2 views of the right elbow show a healing radial head fracture.    PMFS History: Patient Active Problem List   Diagnosis Date Noted  . Closed nondisplaced fracture of head of right radius with routine healing 07/10/2017  . Chronic pain of left knee  07/10/2017  . Acute pain of left knee 07/10/2017  . Unilateral primary osteoarthritis, left knee 07/10/2017  . Closed nondisplaced fracture of head of right radius 06/09/2017   Past Medical History:  Diagnosis Date  . Breast CA ([place])   . Diabetes mellitus without complication ([place])   . Hiatal hernia   . High cholesterol   . Hypertension   . Sleep apnea     No family history on file.  Past Surgical History:  Procedure Laterality Date  . BACK SURGERY    . BREAST LUMPECTOMY    . CHOLECYSTECTOMY    . TONSILLECTOMY    . TUBAL LIGATION     Social History   Occupational History  . Not on file.   Social History Main Topics  . Smoking status: Never Smoker  . Smokeless tobacco: Never Used  . Alcohol use No  .  Drug use: No  . Sexual activity: Not on file

## 2017-08-13 ENCOUNTER — Ambulatory Visit (INDEPENDENT_AMBULATORY_CARE_PROVIDER_SITE_OTHER): Payer: Medicare Other | Admitting: Orthopaedic Surgery

## 2017-08-13 DIAGNOSIS — M1712 Unilateral primary osteoarthritis, left knee: Secondary | ICD-10-CM

## 2017-08-13 DIAGNOSIS — M25511 Pain in right shoulder: Secondary | ICD-10-CM | POA: Diagnosis not present

## 2017-08-13 MED ORDER — LIDOCAINE HCL 1 % IJ SOLN
3.0000 mL | INTRAMUSCULAR | Status: AC | PRN
  Administered 2017-08-13: 3 mL

## 2017-08-13 MED ORDER — METHYLPREDNISOLONE ACETATE 40 MG/ML IJ SUSP
40.0000 mg | INTRAMUSCULAR | Status: AC | PRN
  Administered 2017-08-13: 40 mg via INTRA_ARTICULAR

## 2017-08-13 NOTE — Progress Notes (Signed)
Office Visit Note   Patient: Kristin [name]           Date of Birth: 07-31-1946           MRN: [id number] Visit Date: 08/13/2017              Requested by: Javier Glazier, MD No address on file PCP: Javier Glazier, MD   Assessment & Plan: Visit Diagnoses:  1. Unilateral primary osteoarthritis, left knee   2. Acute pain of right shoulder     Plan: We went over in detail a knee replacement knee replacement model we had a long and thorough discussion of the risk and benefits of the surgery as well as a thorough discussion about the intraoperative and postoperative course. All questions were encouraged and answered. She did wish to try steroid injection her right shoulder and I agree with this as well with expected risks and benefits of injections and how this could affect her blood glucose. She tolerated the injection well. We'll work on scheduling for this knee replaced surgery which she hopes to do in October. We would then see her back in 2 weeks postoperative but no x-rays of be needed.  Follow-Up Instructions: Return for 2 weeks post-op.   Orders:  No orders of the defined types were placed in this encounter.  No orders of the defined types were placed in this encounter.     Procedures: Large Joint Inj Date/Time: 08/13/2017 3:27 PM Performed by: Mcarthur Rossetti Authorized by: Mcarthur Rossetti   Location:  Shoulder Site:  R subacromial bursa Ultrasound Guidance: No   Fluoroscopic Guidance: No   Arthrogram: No   Medications:  3 mL lidocaine 1 %; 40 mg methylPREDNISolone acetate 40 MG/ML     Clinical Data: No additional findings.   Subjective: No chief complaint on file. Patient is well-known to be. She has known severe osteophytes and degenerative joint disease in her left knee. She's had multiple injections over the years and x-rays that show end-stage arthritis. She has tried and failed all forms conservative treatment at this point. This been  going on for many years now. This is now detrimentally affected axis of in, her quality of life, and her mobility. She has pain can be 10 out of 10 at times. She's also is experiencing right shoulder pain with overhead activities. We've been seeing her for a right radial head fracture that occurred earlier this year. She gets still some pain in her elbow but overall is improved significant she states. She is a diabetic with her last with him an A1c being 6.7.  HPI  Review of Systems She is alert or 3 and in no acute distress. She denies any headache, chest pain, sore breath, fever, chills, nausea, vomiting.  Objective: Vital Signs: There were no vitals taken for this visit.  Physical Exam  Ortho Exam Emanation of her left knee shows mild varus malalignment. There significant medial and lateral joint line tenderness and patellofemoral crepitation with good range of motion. Examination of her right elbow shows that she lacks full extension by about 3-5. Her overall function is improving. She does have significant pain with abduction of her right shoulder and positive Neer and Hawkins signs. Specialty Comments:  No specialty comments available.  Imaging: No results found.   PMFS History: Patient Active Problem List   Diagnosis Date Noted  . Closed nondisplaced fracture of head of right radius with routine healing 07/10/2017  . Chronic pain of left  knee 07/10/2017  . Acute pain of left knee 07/10/2017  . Unilateral primary osteoarthritis, left knee 07/10/2017  . Closed nondisplaced fracture of head of right radius 06/09/2017   Past Medical History:  Diagnosis Date  . Breast CA ([place])   . Diabetes mellitus without complication ([place])   . Hiatal hernia   . High cholesterol   . Hypertension   . Sleep apnea     No family history on file.  Past Surgical History:  Procedure Laterality Date  . BACK SURGERY    . BREAST LUMPECTOMY    . CHOLECYSTECTOMY    . TONSILLECTOMY    . TUBAL  LIGATION     Social History   Occupational History  . Not on file.   Social History Main Topics  . Smoking status: Never Smoker  . Smokeless tobacco: Never Used  . Alcohol use No  . Drug use: No  . Sexual activity: Not on file

## 2017-08-18 ENCOUNTER — Other Ambulatory Visit: Payer: Self-pay | Admitting: Internal Medicine

## 2017-08-18 DIAGNOSIS — Z1231 Encounter for screening mammogram for malignant neoplasm of breast: Secondary | ICD-10-CM

## 2017-08-27 ENCOUNTER — Ambulatory Visit
Admission: RE | Admit: 2017-08-27 | Discharge: 2017-08-27 | Disposition: A | Discharge status: Home / Self Care | Payer: Medicare Other | Source: Ambulatory Visit | Attending: Internal Medicine | Admitting: Internal Medicine

## 2017-08-27 DIAGNOSIS — Z1231 Encounter for screening mammogram for malignant neoplasm of breast: Secondary | ICD-10-CM

## 2017-08-27 HISTORY — DX: Personal history of irradiation: Z92.3

## 2017-09-02 NOTE — Progress Notes (Signed)
Please place orders in EPIC as patient has a pre-op appointment on 09/04/2017! Thank you!

## 2017-09-03 ENCOUNTER — Other Ambulatory Visit (INDEPENDENT_AMBULATORY_CARE_PROVIDER_SITE_OTHER): Payer: Self-pay | Admitting: Physician Assistant

## 2017-09-03 ENCOUNTER — Other Ambulatory Visit (HOSPITAL_COMMUNITY): Payer: Self-pay | Admitting: *Deleted

## 2017-09-03 NOTE — Patient Instructions (Signed)
Kristin [name]  09/03/2017   Your procedure is scheduled on: Friday 09-12-17  Report to [facility] Main  Entrance   Follow signs to Short Stay on first floor at 530  AM  Call this number if you have problems the morning of surgery [phone]   Remember: ONLY 1 PERSON MAY GO WITH YOU TO SHORT STAY TO GET  READY MORNING OF [place].  Do not eat food or drink liquids :After Midnight.     Take these medicines the morning of surgery with A SIP OF WATER: AMLODIPINE (NORVASC), LEVOTHYROXINE (SYNTHROID), METOPROLOL, PANTAPRAZOLE (PROTONIX) DO NOT TAKE ANY DIABETIC MEDICATIONS DAY OF YOUR SURGERY                               You may not have any metal on your body including hair pins and              piercings  Do not wear jewelry, make-up, lotions, powders or perfumes, deodorant             Do not wear nail polish.  Do not shave  48 hours prior to surgery.              Men may shave face and neck.   Do not bring valuables to the hospital. [place].  Contacts, dentures or bridgework may not be worn into surgery.  Leave suitcase in the car. After surgery it may be brought to your room.                   Please read over the following fact sheets you were given: _____________________________________________________________________             How to Manage Your Diabetes Before and After Surgery  Why is it important to control my blood sugar before and after surgery? . Improving blood sugar levels before and after surgery helps healing and can limit problems. . A way of improving blood sugar control is eating a healthy diet by: o  Eating less sugar and carbohydrates o  Increasing activity/exercise o  Talking with your doctor about reaching your blood sugar goals . High blood sugars (greater than 180 mg/dL) can raise your risk of infections and slow your recovery, so you will need to focus  on controlling your diabetes during the weeks before surgery. . Make sure that the doctor who takes care of your diabetes knows about your planned surgery including the date and location.  How do I manage my blood sugar before surgery? . Check your blood sugar at least 4 times a day, starting 2 days before surgery, to make sure that the level is not too high or low. o Check your blood sugar the morning of your surgery when you wake up and every 2 hours until you get to the Short Stay unit. . If your blood sugar is less than 70 mg/dL, you will need to treat for low blood sugar: o Do not take insulin. o Treat a low blood sugar (less than 70 mg/dL) with  cup of clear juice (cranberry or apple), 4 glucose tablets, OR glucose gel. o Recheck blood  sugar in 15 minutes after treatment (to make sure it is greater than 70 mg/dL). If your blood sugar is not greater than 70 mg/dL on recheck, call [phone] for further instructions. . Report your blood sugar to the short stay nurse when you get to Short Stay.  . If you are admitted to the hospital after surgery: o Your blood sugar will be checked by the staff and you will probably be given insulin after surgery (instead of oral diabetes medicines) to make sure you have good blood sugar levels. o The goal for blood sugar control after surgery is 80-180 mg/dL.   WHAT DO I DO ABOUT MY DIABETES MEDICATION?  Marland Kitchen Do not take oral diabetes medicines (pills) the morning of surgery.  . THE DAY  BEFORE SURGERY ON 09-11-17 TAKE TOUR METFORMIN AND JANUVIA AS USUAL       . THE MORNING OF SURGERY DO NOT TAKE YOUR METFORMIN AND JANUVIA.      Patient Signature:  Date:   Nurse Signature:  Date:   Reviewed and Endorsed by [facility] Patient Education Committee, August 2015[facility] - Preparing for Surgery Before surgery, you can play an important role.  Because skin is not sterile, your skin needs to be as free of germs as possible.  You can reduce the number  of germs on your skin by washing with CHG (chlorahexidine gluconate) soap before surgery.  CHG is an antiseptic cleaner which kills germs and bonds with the skin to continue killing germs even after washing. Please DO NOT use if you have an allergy to CHG or antibacterial soaps.  If your skin becomes reddened/irritated stop using the CHG and inform your nurse when you arrive at Short Stay. Do not shave (including legs and underarms) for at least 48 hours prior to the first CHG shower.  You may shave your face/neck. Please follow these instructions carefully:  1.  Shower with CHG Soap the night before surgery and the  morning of Surgery.  2.  If you choose to wash your hair, wash your hair first as usual with your  normal  shampoo.  3.  After you shampoo, rinse your hair and body thoroughly to remove the  shampoo.                           4.  Use CHG as you would any other liquid soap.  You can apply chg directly  to the skin and wash                       Gently with a scrungie or clean washcloth.  5.  Apply the CHG Soap to your body ONLY FROM THE NECK DOWN.   Do not use on face/ open                           Wound or open sores. Avoid contact with eyes, ears mouth and genitals (private parts).                       Wash face,  Genitals (private parts) with your normal soap.             6.  Wash thoroughly, paying special attention to the area where your surgery  will be performed.  7.  Thoroughly rinse your body with warm water from the neck down.  8.  DO  NOT shower/wash with your normal soap after using and rinsing off  the CHG Soap.                9.  Pat yourself dry with a clean towel.            10.  Wear clean pajamas.            11.  Place clean sheets on your bed the night of your first shower and do not  sleep with pets. Day of Surgery : Do not apply any lotions/deodorants the morning of surgery.  Please wear clean clothes to the hospital/surgery center.    Incentive  Spirometer  An incentive spirometer is a tool that can help keep your lungs clear and active. This tool measures how well you are filling your lungs with each breath. Taking long deep breaths may help reverse or decrease the chance of developing breathing (pulmonary) problems (especially infection) following:  A long period of time when you are unable to move or be active. BEFORE THE PROCEDURE   If the spirometer includes an indicator to show your best effort, your nurse or respiratory therapist will set it to a desired goal.  If possible, sit up straight or lean slightly forward. Try not to slouch.  Hold the incentive spirometer in an upright position. INSTRUCTIONS FOR USE  1. Sit on the edge of your bed if possible, or sit up as far as you can in bed or on a chair. 2. Hold the incentive spirometer in an upright position. 3. Breathe out normally. 4. Place the mouthpiece in your mouth and seal your lips tightly around it. 5. Breathe in slowly and as deeply as possible, raising the piston or the ball toward the top of the column. 6. Hold your breath for 3-5 seconds or for as long as possible. Allow the piston or ball to fall to the bottom of the column. 7. Remove the mouthpiece from your mouth and breathe out normally. 8. Rest for a few seconds and repeat Steps 1 through 7 at least 10 times every 1-2 hours when you are awake. Take your time and take a few normal breaths between deep breaths. 9. The spirometer may include an indicator to show your best effort. Use the indicator as a goal to work toward during each repetition. 10. After each set of 10 deep breaths, practice coughing to be sure your lungs are clear. If you have an incision (the cut made at the time of surgery), support your incision when coughing by placing a pillow or rolled up towels firmly against it. Once you are able to get out of bed, walk around indoors and cough well. You may stop using the incentive spirometer when  instructed by your caregiver.  RISKS AND COMPLICATIONS  Take your time so you do not get dizzy or light-headed.  If you are in pain, you may need to take or ask for pain medication before doing incentive spirometry. It is harder to take a deep breath if you are having pain. AFTER USE  Rest and breathe slowly and easily.  It can be helpful to keep track of a log of your progress. Your caregiver can provide you with a simple table to help with this. If you are using the spirometer at home, follow these instructions: [place] IF:   You are having difficultly using the spirometer.  You have trouble using the spirometer as often as instructed.  Your pain medication is  not giving enough relief while using the spirometer.  You develop fever of 100.5 F (38.1 C) or higher. SEEK IMMEDIATE MEDICAL CARE IF:   You cough up bloody sputum that had not been present before.  You develop fever of 102 F (38.9 C) or greater.  You develop worsening pain at or near the incision site. MAKE SURE YOU:   Understand these instructions.  Will watch your condition.  Will get help right away if you are not doing well or get worse. Document Released: 04/07/2007 Document Revised: 02/17/2012 Document Reviewed: 06/08/2007 [facility] Patient Information 2014 [place], [place].   ________________________________________________________________________

## 2017-09-04 ENCOUNTER — Encounter (HOSPITAL_COMMUNITY)
Admission: RE | Admit: 2017-09-04 | Discharge: 2017-09-04 | Disposition: A | Discharge status: Home / Self Care | Payer: Medicare Other | Source: Ambulatory Visit | Attending: Orthopaedic Surgery | Admitting: Orthopaedic Surgery

## 2017-09-04 ENCOUNTER — Encounter (HOSPITAL_COMMUNITY): Payer: Self-pay

## 2017-09-04 DIAGNOSIS — Z01818 Encounter for other preprocedural examination: Secondary | ICD-10-CM | POA: Diagnosis not present

## 2017-09-04 DIAGNOSIS — Z7984 Long term (current) use of oral hypoglycemic drugs: Secondary | ICD-10-CM | POA: Diagnosis not present

## 2017-09-04 DIAGNOSIS — Z79899 Other long term (current) drug therapy: Secondary | ICD-10-CM | POA: Insufficient documentation

## 2017-09-04 DIAGNOSIS — I440 Atrioventricular block, first degree: Secondary | ICD-10-CM | POA: Insufficient documentation

## 2017-09-04 DIAGNOSIS — M1712 Unilateral primary osteoarthritis, left knee: Secondary | ICD-10-CM | POA: Insufficient documentation

## 2017-09-04 HISTORY — DX: Gastro-esophageal reflux disease without esophagitis: K21.9

## 2017-09-04 HISTORY — DX: Hypothyroidism, unspecified: E03.9

## 2017-09-04 HISTORY — DX: Anemia, unspecified: D64.9

## 2017-09-04 HISTORY — DX: Unspecified osteoarthritis, unspecified site: M19.90

## 2017-09-04 HISTORY — DX: Chronic kidney disease, unspecified: N18.9

## 2017-09-04 LAB — BASIC METABOLIC PANEL
Anion gap: 9 (ref 5–15)
BUN: 32 mg/dL — AB (ref 6–20)
CHLORIDE: 102 mmol/L (ref 101–111)
CO2: 24 mmol/L (ref 22–32)
CREATININE: 1.76 mg/dL — AB (ref 0.44–1.00)
Calcium: 9.1 mg/dL (ref 8.9–10.3)
GFR calc Af Amer: 32 mL/min — ABNORMAL LOW (ref >60)
GFR calc non Af Amer: 28 mL/min — ABNORMAL LOW (ref >60)
Glucose, Bld: 144 mg/dL — ABNORMAL HIGH (ref 65–99)
Potassium: 4.4 mmol/L (ref 3.5–5.1)
SODIUM: 135 mmol/L (ref 135–145)

## 2017-09-04 LAB — CBC
HCT: 32 % — ABNORMAL LOW (ref 36.0–46.0)
Hemoglobin: 10.8 g/dL — ABNORMAL LOW (ref 12.0–15.0)
MCH: 31.1 pg (ref 26.0–34.0)
MCHC: 33.8 g/dL (ref 30.0–36.0)
MCV: 92.2 fL (ref 78.0–100.0)
PLATELETS: 247 10*3/uL (ref 150–400)
RBC: 3.47 MIL/uL — ABNORMAL LOW (ref 3.87–5.11)
RDW: 12.9 % (ref 11.5–15.5)
WBC: 8.3 10*3/uL (ref 4.0–10.5)

## 2017-09-04 LAB — HEMOGLOBIN A1C
Hgb A1c MFr Bld: 6.4 % — ABNORMAL HIGH (ref 4.8–5.6)
MEAN PLASMA GLUCOSE: 136.98 mg/dL

## 2017-09-04 LAB — SURGICAL PCR SCREEN
MRSA, PCR: POSITIVE — AB
Staphylococcus aureus: POSITIVE — AB

## 2017-09-04 LAB — GLUCOSE, CAPILLARY: GLUCOSE-CAPILLARY: 145 mg/dL — AB (ref 65–99)

## 2017-09-04 NOTE — Progress Notes (Signed)
ROUTED POSITIVE MRSA AND POSITIVE STAPH RESULTS AND BMET RESULTS TO DR Ninfa Linden BY EPIC.

## 2017-09-11 MED ORDER — VANCOMYCIN HCL 10 G IV SOLR
1500.0000 mg | Freq: Once | INTRAVENOUS | Status: AC
  Administered 2017-09-12: 1500 mg via INTRAVENOUS
  Filled 2017-09-11: qty 1500

## 2017-09-11 MED ORDER — DEXTROSE 5 % IV SOLN
3.0000 g | INTRAVENOUS | Status: DC
  Filled 2017-09-11: qty 3000

## 2017-09-11 NOTE — Anesthesia Preprocedure Evaluation (Addendum)
Anesthesia Evaluation  Patient identified by MRN, date of birth, ID band Patient awake    Reviewed: Allergy & Precautions, NPO status , Patient's Chart, lab work & pertinent test results, reviewed documented beta blocker date and time   History of Anesthesia Complications Negative for: history of anesthetic complications  Airway Mallampati: III  TM Distance: >3 FB Neck ROM: Full    Dental  (+) Dental Advisory Given   Pulmonary sleep apnea and Continuous Positive Airway Pressure Ventilation ,    breath sounds clear to auscultation       Cardiovascular hypertension, Pt. on medications and Pt. on home beta blockers (-) angina Rhythm:Regular Rate:Normal     Neuro/Psych negative neurological ROS     GI/Hepatic Neg liver ROS, GERD  Medicated and Controlled,  Endo/Other  diabetes (glu 175), Oral Hypoglycemic AgentsHypothyroidism Morbid obesity  Renal/GU Renal InsufficiencyRenal disease (creat 1.76)     Musculoskeletal  (+) Arthritis , Osteoarthritis,    Abdominal (+) + obese,   Peds  Hematology Hb 10.8   Anesthesia Other Findings H/o breast cancer: surgery, chemo, XRT  Reproductive/Obstetrics                            Anesthesia Physical Anesthesia Plan  ASA: III  Anesthesia Plan: Spinal   Post-op Pain Management:  Regional for Post-op pain   Induction:   PONV Risk Score and Plan: 2 and Ondansetron, Dexamethasone, Midazolam and Treatment may vary due to age or medical condition  Airway Management Planned: Natural Airway and Simple Face Mask  Additional Equipment:   Intra-op Plan:   Post-operative Plan:   Informed Consent: I have reviewed the patients History and Physical, chart, labs and discussed the procedure including the risks, benefits and alternatives for the proposed anesthesia with the patient or authorized representative who has indicated his/her understanding and acceptance.    Dental advisory given  Plan Discussed with: CRNA and Surgeon  Anesthesia Plan Comments: (Plan routine monitors, SAB with adductor canal block for post op analgesia)        Anesthesia Quick Evaluation

## 2017-09-12 ENCOUNTER — Inpatient Hospital Stay (HOSPITAL_COMMUNITY): Payer: Medicare Other | Admitting: Anesthesiology

## 2017-09-12 ENCOUNTER — Inpatient Hospital Stay (HOSPITAL_COMMUNITY)
Admission: RE | Admit: 2017-09-12 | Discharge: 2017-09-16 | DRG: 470 | Disposition: A | Discharge status: Skilled Nursing Facility | Payer: Medicare Other | Source: Ambulatory Visit | Attending: Orthopaedic Surgery | Admitting: Orthopaedic Surgery

## 2017-09-12 ENCOUNTER — Encounter (HOSPITAL_COMMUNITY): Payer: Self-pay

## 2017-09-12 ENCOUNTER — Inpatient Hospital Stay (HOSPITAL_COMMUNITY): Payer: Medicare Other

## 2017-09-12 ENCOUNTER — Encounter (HOSPITAL_COMMUNITY)
Admission: RE | Disposition: A | Discharge status: Skilled Nursing Facility | Payer: Self-pay | Source: Ambulatory Visit | Attending: Orthopaedic Surgery

## 2017-09-12 DIAGNOSIS — M1712 Unilateral primary osteoarthritis, left knee: Principal | ICD-10-CM | POA: Diagnosis present

## 2017-09-12 DIAGNOSIS — Z886 Allergy status to analgesic agent status: Secondary | ICD-10-CM | POA: Diagnosis not present

## 2017-09-12 DIAGNOSIS — Z79899 Other long term (current) drug therapy: Secondary | ICD-10-CM | POA: Diagnosis not present

## 2017-09-12 DIAGNOSIS — Z7984 Long term (current) use of oral hypoglycemic drugs: Secondary | ICD-10-CM | POA: Diagnosis not present

## 2017-09-12 DIAGNOSIS — Z888 Allergy status to other drugs, medicaments and biological substances status: Secondary | ICD-10-CM | POA: Diagnosis not present

## 2017-09-12 DIAGNOSIS — Z853 Personal history of malignant neoplasm of breast: Secondary | ICD-10-CM

## 2017-09-12 DIAGNOSIS — N189 Chronic kidney disease, unspecified: Secondary | ICD-10-CM | POA: Diagnosis present

## 2017-09-12 DIAGNOSIS — E1122 Type 2 diabetes mellitus with diabetic chronic kidney disease: Secondary | ICD-10-CM | POA: Diagnosis present

## 2017-09-12 DIAGNOSIS — Z9989 Dependence on other enabling machines and devices: Secondary | ICD-10-CM

## 2017-09-12 DIAGNOSIS — G4730 Sleep apnea, unspecified: Secondary | ICD-10-CM | POA: Diagnosis present

## 2017-09-12 DIAGNOSIS — E7800 Pure hypercholesterolemia, unspecified: Secondary | ICD-10-CM | POA: Diagnosis present

## 2017-09-12 DIAGNOSIS — K219 Gastro-esophageal reflux disease without esophagitis: Secondary | ICD-10-CM | POA: Diagnosis present

## 2017-09-12 DIAGNOSIS — Z96652 Presence of left artificial knee joint: Secondary | ICD-10-CM

## 2017-09-12 DIAGNOSIS — E039 Hypothyroidism, unspecified: Secondary | ICD-10-CM | POA: Diagnosis present

## 2017-09-12 DIAGNOSIS — Z923 Personal history of irradiation: Secondary | ICD-10-CM

## 2017-09-12 DIAGNOSIS — Z6841 Body Mass Index (BMI) 40.0 and over, adult: Secondary | ICD-10-CM

## 2017-09-12 DIAGNOSIS — I129 Hypertensive chronic kidney disease with stage 1 through stage 4 chronic kidney disease, or unspecified chronic kidney disease: Secondary | ICD-10-CM | POA: Diagnosis present

## 2017-09-12 HISTORY — PX: TOTAL KNEE ARTHROPLASTY: SHX125

## 2017-09-12 LAB — GLUCOSE, CAPILLARY
GLUCOSE-CAPILLARY: 114 mg/dL — AB (ref 65–99)
Glucose-Capillary: 175 mg/dL — ABNORMAL HIGH (ref 65–99)

## 2017-09-12 SURGERY — ARTHROPLASTY, KNEE, TOTAL
Anesthesia: Spinal | Site: Knee | Laterality: Left

## 2017-09-12 MED ORDER — HYDROMORPHONE HCL 2 MG/ML IJ SOLN
INTRAMUSCULAR | Status: AC
  Filled 2017-09-12: qty 1

## 2017-09-12 MED ORDER — SODIUM CHLORIDE 0.9 % IV SOLN
INTRAVENOUS | Status: DC
  Administered 2017-09-12 (×2): via INTRAVENOUS

## 2017-09-12 MED ORDER — PANTOPRAZOLE SODIUM 40 MG PO TBEC
40.0000 mg | DELAYED_RELEASE_TABLET | Freq: Every evening | ORAL | Status: DC
  Administered 2017-09-13 – 2017-09-15 (×3): 40 mg via ORAL
  Filled 2017-09-12 (×3): qty 1

## 2017-09-12 MED ORDER — STERILE WATER FOR IRRIGATION IR SOLN
Status: DC | PRN
Start: 2017-09-12 — End: 2017-09-12
  Administered 2017-09-12: 2000 mL

## 2017-09-12 MED ORDER — ONDANSETRON HCL 4 MG PO TABS: 4.0000 mg | ORAL_TABLET | Freq: Four times a day (QID) | ORAL | Status: DC | PRN

## 2017-09-12 MED ORDER — POLYETHYLENE GLYCOL 3350 17 G PO PACK: 17.0000 g | PACK | Freq: Every day | ORAL | Status: DC | PRN

## 2017-09-12 MED ORDER — MIDAZOLAM HCL 2 MG/2ML IJ SOLN: 0.5000 mg | Freq: Once | INTRAMUSCULAR | Status: DC | PRN

## 2017-09-12 MED ORDER — ACETAMINOPHEN 325 MG PO TABS
650.0000 mg | ORAL_TABLET | Freq: Four times a day (QID) | ORAL | Status: DC | PRN
  Administered 2017-09-13 – 2017-09-16 (×4): 650 mg via ORAL
  Filled 2017-09-12 (×4): qty 2

## 2017-09-12 MED ORDER — METHOCARBAMOL 500 MG PO TABS
500.0000 mg | ORAL_TABLET | Freq: Four times a day (QID) | ORAL | Status: DC | PRN
  Administered 2017-09-12 – 2017-09-16 (×5): 500 mg via ORAL
  Filled 2017-09-12 (×5): qty 1

## 2017-09-12 MED ORDER — ROPIVACAINE HCL 7.5 MG/ML IJ SOLN
INTRAMUSCULAR | Status: DC | PRN
  Administered 2017-09-12: 20 mL via PERINEURAL

## 2017-09-12 MED ORDER — HYDROMORPHONE HCL-NACL 0.5-0.9 MG/ML-% IV SOSY: 0.2500 mg | PREFILLED_SYRINGE | INTRAVENOUS | Status: DC | PRN

## 2017-09-12 MED ORDER — MENTHOL 3 MG MT LOZG: 1.0000 | LOZENGE | OROMUCOSAL | Status: DC | PRN

## 2017-09-12 MED ORDER — ATORVASTATIN CALCIUM 40 MG PO TABS
40.0000 mg | ORAL_TABLET | Freq: Every evening | ORAL | Status: DC
  Administered 2017-09-12 – 2017-09-15 (×4): 40 mg via ORAL
  Filled 2017-09-12 (×4): qty 1

## 2017-09-12 MED ORDER — METHOCARBAMOL 1000 MG/10ML IJ SOLN
500.0000 mg | Freq: Four times a day (QID) | INTRAVENOUS | Status: DC | PRN
  Administered 2017-09-12: 500 mg via INTRAVENOUS
  Filled 2017-09-12: qty 550

## 2017-09-12 MED ORDER — BUPIVACAINE IN DEXTROSE 0.75-8.25 % IT SOLN
INTRATHECAL | Status: DC | PRN
  Administered 2017-09-12: 2 mL via INTRATHECAL

## 2017-09-12 MED ORDER — CEFAZOLIN SODIUM-DEXTROSE 2-4 GM/100ML-% IV SOLN
INTRAVENOUS | Status: AC
  Filled 2017-09-12: qty 100

## 2017-09-12 MED ORDER — METOPROLOL TARTRATE 50 MG PO TABS
50.0000 mg | ORAL_TABLET | Freq: Two times a day (BID) | ORAL | Status: DC
  Administered 2017-09-12 – 2017-09-16 (×8): 50 mg via ORAL
  Filled 2017-09-12 (×8): qty 1

## 2017-09-12 MED ORDER — MIDAZOLAM HCL 2 MG/2ML IJ SOLN: 1.0000 mg | INTRAMUSCULAR | Status: DC | PRN

## 2017-09-12 MED ORDER — VANCOMYCIN HCL IN DEXTROSE 1-5 GM/200ML-% IV SOLN: 1000.0000 mg | Freq: Two times a day (BID) | INTRAVENOUS | Status: DC

## 2017-09-12 MED ORDER — PROMETHAZINE HCL 25 MG/ML IJ SOLN: 6.2500 mg | INTRAMUSCULAR | Status: DC | PRN

## 2017-09-12 MED ORDER — COLESTIPOL HCL 1 G PO TABS
2.0000 g | ORAL_TABLET | Freq: Every day | ORAL | Status: DC
  Administered 2017-09-12 – 2017-09-15 (×4): 2 g via ORAL
  Filled 2017-09-12 (×4): qty 2

## 2017-09-12 MED ORDER — TRANEXAMIC ACID 1000 MG/10ML IV SOLN
1000.0000 mg | INTRAVENOUS | Status: AC
  Administered 2017-09-12: 1000 mg via INTRAVENOUS
  Filled 2017-09-12: qty 1100

## 2017-09-12 MED ORDER — ONDANSETRON HCL 4 MG/2ML IJ SOLN: 4.0000 mg | Freq: Four times a day (QID) | INTRAMUSCULAR | Status: DC | PRN

## 2017-09-12 MED ORDER — HYDROMORPHONE HCL 1 MG/ML IJ SOLN
INTRAMUSCULAR | Status: DC | PRN
  Administered 2017-09-12: 1 mg via INTRAVENOUS

## 2017-09-12 MED ORDER — HYDROMORPHONE HCL-NACL 0.5-0.9 MG/ML-% IV SOSY
1.0000 mg | PREFILLED_SYRINGE | INTRAVENOUS | Status: DC | PRN
  Administered 2017-09-12 – 2017-09-13 (×4): 1 mg via INTRAVENOUS
  Filled 2017-09-12 (×4): qty 2

## 2017-09-12 MED ORDER — PROPOFOL 10 MG/ML IV BOLUS
INTRAVENOUS | Status: AC
  Filled 2017-09-12: qty 20

## 2017-09-12 MED ORDER — FENTANYL CITRATE (PF) 100 MCG/2ML IJ SOLN
INTRAMUSCULAR | Status: DC | PRN
  Administered 2017-09-12 (×4): 50 ug via INTRAVENOUS

## 2017-09-12 MED ORDER — FENTANYL CITRATE (PF) 100 MCG/2ML IJ SOLN
INTRAMUSCULAR | Status: AC
  Filled 2017-09-12: qty 2

## 2017-09-12 MED ORDER — PHENOL 1.4 % MT LIQD: 1.0000 | OROMUCOSAL | Status: DC | PRN

## 2017-09-12 MED ORDER — DIPHENHYDRAMINE HCL 12.5 MG/5ML PO ELIX
12.5000 mg | ORAL_SOLUTION | ORAL | Status: DC | PRN
Start: 2017-09-12 — End: 2017-09-16

## 2017-09-12 MED ORDER — AMLODIPINE BESYLATE 5 MG PO TABS
5.0000 mg | ORAL_TABLET | Freq: Two times a day (BID) | ORAL | Status: DC
  Administered 2017-09-12 – 2017-09-16 (×8): 5 mg via ORAL
  Filled 2017-09-12 (×8): qty 1

## 2017-09-12 MED ORDER — MIDAZOLAM HCL 5 MG/5ML IJ SOLN
INTRAMUSCULAR | Status: DC | PRN
  Administered 2017-09-12: 2 mg via INTRAVENOUS
  Administered 2017-09-12 (×2): 1 mg via INTRAVENOUS

## 2017-09-12 MED ORDER — MIDAZOLAM HCL 2 MG/2ML IJ SOLN
INTRAMUSCULAR | Status: AC
  Filled 2017-09-12: qty 2

## 2017-09-12 MED ORDER — SODIUM CHLORIDE 0.9 % IR SOLN
Status: DC | PRN
  Administered 2017-09-12: 2000 mL

## 2017-09-12 MED ORDER — FENTANYL CITRATE (PF) 100 MCG/2ML IJ SOLN: 50.0000 ug | INTRAMUSCULAR | Status: DC | PRN

## 2017-09-12 MED ORDER — LISINOPRIL 20 MG PO TABS
30.0000 mg | ORAL_TABLET | Freq: Two times a day (BID) | ORAL | Status: DC
  Administered 2017-09-12 – 2017-09-16 (×8): 30 mg via ORAL
  Filled 2017-09-12 (×8): qty 1

## 2017-09-12 MED ORDER — LACTATED RINGERS IV SOLN
INTRAVENOUS | Status: DC
  Administered 2017-09-12: 09:00:00 via INTRAVENOUS
  Administered 2017-09-12: 1000 mL via INTRAVENOUS

## 2017-09-12 MED ORDER — MEPERIDINE HCL 50 MG/ML IJ SOLN: 6.2500 mg | INTRAMUSCULAR | Status: DC | PRN

## 2017-09-12 MED ORDER — DEXAMETHASONE SODIUM PHOSPHATE 10 MG/ML IJ SOLN
INTRAMUSCULAR | Status: AC
  Filled 2017-09-12: qty 1

## 2017-09-12 MED ORDER — LEVOTHYROXINE SODIUM 50 MCG PO TABS
50.0000 ug | ORAL_TABLET | Freq: Every day | ORAL | Status: DC
  Administered 2017-09-13 – 2017-09-16 (×4): 50 ug via ORAL
  Filled 2017-09-12 (×4): qty 1

## 2017-09-12 MED ORDER — PROPOFOL 500 MG/50ML IV EMUL
INTRAVENOUS | Status: DC | PRN
  Administered 2017-09-12: 100 ug/kg/min via INTRAVENOUS

## 2017-09-12 MED ORDER — FLUTICASONE PROPIONATE 50 MCG/ACT NA SUSP
1.0000 | Freq: Every day | NASAL | Status: DC
  Administered 2017-09-12 – 2017-09-14 (×3): 1 via NASAL
  Filled 2017-09-12: qty 16

## 2017-09-12 MED ORDER — METOCLOPRAMIDE HCL 5 MG/ML IJ SOLN
5.0000 mg | Freq: Three times a day (TID) | INTRAMUSCULAR | Status: DC | PRN
Start: 2017-09-12 — End: 2017-09-16

## 2017-09-12 MED ORDER — METFORMIN HCL 500 MG PO TABS
1000.0000 mg | ORAL_TABLET | Freq: Two times a day (BID) | ORAL | Status: DC
  Administered 2017-09-12 – 2017-09-16 (×8): 1000 mg via ORAL
  Filled 2017-09-12 (×8): qty 2

## 2017-09-12 MED ORDER — GABAPENTIN 300 MG PO CAPS
600.0000 mg | ORAL_CAPSULE | Freq: Every day | ORAL | Status: DC
  Administered 2017-09-12 – 2017-09-15 (×4): 600 mg via ORAL
  Filled 2017-09-12 (×4): qty 2

## 2017-09-12 MED ORDER — METOCLOPRAMIDE HCL 5 MG PO TABS: 5.0000 mg | ORAL_TABLET | Freq: Three times a day (TID) | ORAL | Status: DC | PRN

## 2017-09-12 MED ORDER — FUROSEMIDE 20 MG PO TABS: 10.0000 mg | ORAL_TABLET | Freq: Every day | ORAL | Status: DC | PRN

## 2017-09-12 MED ORDER — ZOLPIDEM TARTRATE 5 MG PO TABS
5.0000 mg | ORAL_TABLET | Freq: Every day | ORAL | Status: DC
  Administered 2017-09-12 – 2017-09-15 (×4): 5 mg via ORAL
  Filled 2017-09-12 (×4): qty 1

## 2017-09-12 MED ORDER — 0.9 % SODIUM CHLORIDE (POUR BTL) OPTIME
TOPICAL | Status: DC | PRN
Start: 2017-09-12 — End: 2017-09-12
  Administered 2017-09-12: 1000 mL

## 2017-09-12 MED ORDER — DOCUSATE SODIUM 100 MG PO CAPS
100.0000 mg | ORAL_CAPSULE | Freq: Two times a day (BID) | ORAL | Status: DC
  Administered 2017-09-12 – 2017-09-16 (×8): 100 mg via ORAL
  Filled 2017-09-12 (×8): qty 1

## 2017-09-12 MED ORDER — ACETAMINOPHEN 650 MG RE SUPP: 650.0000 mg | Freq: Four times a day (QID) | RECTAL | Status: DC | PRN

## 2017-09-12 MED ORDER — LINAGLIPTIN 5 MG PO TABS
5.0000 mg | ORAL_TABLET | Freq: Every day | ORAL | Status: DC
  Administered 2017-09-12 – 2017-09-16 (×5): 5 mg via ORAL
  Filled 2017-09-12 (×5): qty 1

## 2017-09-12 MED ORDER — RIVAROXABAN 10 MG PO TABS
10.0000 mg | ORAL_TABLET | Freq: Every day | ORAL | Status: DC
  Administered 2017-09-13 – 2017-09-16 (×4): 10 mg via ORAL
  Filled 2017-09-12 (×4): qty 1

## 2017-09-12 MED ORDER — PROPOFOL 10 MG/ML IV BOLUS
INTRAVENOUS | Status: AC
  Filled 2017-09-12: qty 60

## 2017-09-12 MED ORDER — OXYCODONE HCL 5 MG PO TABS
5.0000 mg | ORAL_TABLET | ORAL | Status: DC | PRN
  Administered 2017-09-12: 10 mg via ORAL
  Administered 2017-09-12: 5 mg via ORAL
  Administered 2017-09-13 – 2017-09-15 (×9): 10 mg via ORAL
  Filled 2017-09-12 (×9): qty 2
  Filled 2017-09-12: qty 1
  Filled 2017-09-12: qty 2

## 2017-09-12 SURGICAL SUPPLY — 48 items
BAG URINE DRAINAGE (UROLOGICAL SUPPLIES) ×3 IMPLANT
BAG ZIPLOCK 12X15 (MISCELLANEOUS) IMPLANT
BANDAGE ACE 6X5 VEL STRL LF (GAUZE/BANDAGES/DRESSINGS) ×3 IMPLANT
BENZOIN TINCTURE PRP APPL 2/3 (GAUZE/BANDAGES/DRESSINGS) IMPLANT
BLADE SAG 18X100X1.27 (BLADE) ×3 IMPLANT
BOWL SMART MIX CTS (DISPOSABLE) ×3 IMPLANT
CAPT KNEE TOTAL 3 ×3 IMPLANT
CEMENT BONE SIMPLEX SPEEDSET (Cement) ×6 IMPLANT
CLOSURE WOUND 1/2 X4 (GAUZE/BANDAGES/DRESSINGS)
COVER SURGICAL LIGHT HANDLE (MISCELLANEOUS) ×3 IMPLANT
CUFF TOURN SGL QUICK 34 (TOURNIQUET CUFF) ×2
CUFF TRNQT CYL 34X4X40X1 (TOURNIQUET CUFF) ×1 IMPLANT
DRAPE U-SHAPE 47X51 STRL (DRAPES) ×3 IMPLANT
DRSG AQUACEL AG ADV 3.5X10 (GAUZE/BANDAGES/DRESSINGS) ×3 IMPLANT
DRSG PAD ABDOMINAL 8X10 ST (GAUZE/BANDAGES/DRESSINGS) ×6 IMPLANT
DURAPREP 26ML APPLICATOR (WOUND CARE) ×3 IMPLANT
ELECT REM PT RETURN 15FT ADLT (MISCELLANEOUS) ×3 IMPLANT
GAUZE SPONGE 4X4 12PLY STRL (GAUZE/BANDAGES/DRESSINGS) ×3 IMPLANT
GAUZE XEROFORM 1X8 LF (GAUZE/BANDAGES/DRESSINGS) ×3 IMPLANT
GLOVE BIO SURGEON STRL SZ7.5 (GLOVE) ×3 IMPLANT
GLOVE BIOGEL PI IND STRL 8 (GLOVE) ×2 IMPLANT
GLOVE BIOGEL PI INDICATOR 8 (GLOVE) ×4
GLOVE ECLIPSE 8.0 STRL XLNG CF (GLOVE) ×3 IMPLANT
GOWN STRL REUS W/TWL XL LVL3 (GOWN DISPOSABLE) ×6 IMPLANT
HANDPIECE INTERPULSE COAX TIP (DISPOSABLE) ×2
IMMOBILIZER KNEE 20 (SOFTGOODS) ×3
IMMOBILIZER KNEE 20 THIGH 36 (SOFTGOODS) ×1 IMPLANT
NS IRRIG 1000ML POUR BTL (IV SOLUTION) ×3 IMPLANT
PACK TOTAL KNEE CUSTOM (KITS) ×3 IMPLANT
PADDING CAST COTTON 6X4 STRL (CAST SUPPLIES) ×6 IMPLANT
PATELLA TRIATHALON (Knees) ×3 IMPLANT
POSITIONER SURGICAL ARM (MISCELLANEOUS) ×3 IMPLANT
SET HNDPC FAN SPRY TIP SCT (DISPOSABLE) ×1 IMPLANT
SET PAD KNEE POSITIONER (MISCELLANEOUS) ×3 IMPLANT
STAPLER VISISTAT 35W (STAPLE) ×3 IMPLANT
STRIP CLOSURE SKIN 1/2X4 (GAUZE/BANDAGES/DRESSINGS) IMPLANT
SUT MNCRL AB 4-0 PS2 18 (SUTURE) IMPLANT
SUT VIC AB 0 CT1 27 (SUTURE) ×2
SUT VIC AB 0 CT1 27XBRD ANTBC (SUTURE) ×1 IMPLANT
SUT VIC AB 1 CT1 27 (SUTURE) ×6
SUT VIC AB 1 CT1 27XBRD ANTBC (SUTURE) ×3 IMPLANT
SUT VIC AB 2-0 CT1 27 (SUTURE) ×4
SUT VIC AB 2-0 CT1 TAPERPNT 27 (SUTURE) ×2 IMPLANT
TRAY FOLEY CATH 14FRSI W/METER (CATHETERS) ×3 IMPLANT
TRAY FOLEY W/METER SILVER 16FR (SET/KITS/TRAYS/PACK) ×3 IMPLANT
WATER STERILE IRR 1500ML POUR (IV SOLUTION) ×3 IMPLANT
WRAP KNEE MAXI GEL POST OP (GAUZE/BANDAGES/DRESSINGS) ×3 IMPLANT
YANKAUER SUCT BULB TIP 10FT TU (MISCELLANEOUS) ×3 IMPLANT

## 2017-09-12 NOTE — Transfer of Care (Signed)
Immediate Anesthesia Transfer of Care Note  Patient: Kristin [name]  Procedure(s) Performed: LEFT TOTAL KNEE ARTHROPLASTY (Left Knee)  Patient Location: PACU  Anesthesia Type:Spinal  Level of Consciousness: awake, alert , oriented and patient cooperative  Airway & Oxygen Therapy: Patient Spontanous Breathing and Patient connected to face mask oxygen  Post-op Assessment: Report given to RN, Post -op Vital signs reviewed and stable and Patient moving all extremities X 4  Post vital signs: stable  Last Vitals:  Vitals:   09/12/17 0542 09/12/17 0930  BP: (!) 151/83   Pulse: 97   Resp: 16 (P) 15  Temp: 37.1 C (P) 36.4 C  SpO2: 96%     Last Pain:  Vitals:   09/12/17 0542  TempSrc: Oral      Patients Stated Pain Goal: 4 (95/07/22 5750)  Complications: No apparent anesthesia complications  Able to  Move legs, comfortable

## 2017-09-12 NOTE — Anesthesia Procedure Notes (Signed)
Anesthesia Regional Block: Adductor canal block   Pre-Anesthetic Checklist: ,, timeout performed, Correct Patient, Correct Site, Correct Laterality, Correct Procedure, Correct Position, site marked, Risks and benefits discussed,  Surgical consent,  Pre-op evaluation,  At surgeon's request and post-op pain management  Laterality: Left and Lower  Prep: chloraprep       Needles:  Injection technique: Single-shot  Needle Type: Echogenic Needle     Needle Length: 9cm  Needle Gauge: 21     Additional Needles:   Procedures:,,,, ultrasound used (permanent image in chart),,,,  Narrative:  Start time: 09/12/2017 7:10 AM End time: 09/12/2017 7:19 AM Injection made incrementally with aspirations every 5 mL.  Performed by: Personally  Anesthesiologist: Glennon Mac, Jebadiah Imperato  Additional Notes: Pt identified in Holding room.  Monitors applied. Working IV access confirmed. Sterile prep, drape L thigh.  #21ga ECHOgenic needle into adductor canal with US guidance.  20cc 0.75% Ropivacaine  injected incrementally after negative test dose.  Patient asymptomatic, VSS, no heme aspirated, tolerated well.  Jenita Seashore, MD

## 2017-09-12 NOTE — Brief Op Note (Signed)
09/12/2017  9:05 AM  PATIENT:  Kristin [name]  71 y.o. female  PRE-OPERATIVE DIAGNOSIS:  osteoarthritis left knee  POST-OPERATIVE DIAGNOSIS:  osteoarthritis left knee  PROCEDURE:  Procedure(s): LEFT TOTAL KNEE ARTHROPLASTY (Left)  SURGEON:  Surgeon(s) and Role:    Mcarthur Rossetti, MD - Primary  PHYSICIAN ASSISTANT: Benita Stabile, PA-C  ANESTHESIA:   regional and spinal  EBL:  Total I/O In: -  Out: 50 [Blood:50]  COUNTS:  YES  TOURNIQUET:   Total Tourniquet Time Documented: Thigh (Right) - 49 minutes Total: Thigh (Right) - 49 minutes   DICTATION: .Other Dictation: Dictation Number [id number]  PLAN OF CARE: Admit to inpatient   PATIENT DISPOSITION:  PACU - hemodynamically stable.   Delay start of Pharmacological VTE agent (>24hrs) due to surgical blood loss or risk of bleeding: no

## 2017-09-12 NOTE — Anesthesia Procedure Notes (Signed)
Procedure Name: MAC Date/Time: 09/12/2017 7:27 AM Performed by: Lissa Morales Pre-anesthesia Checklist: Patient identified, Emergency Drugs available, Suction available and Patient being monitored Patient Re-evaluated:Patient Re-evaluated prior to induction Oxygen Delivery Method: Simple face mask Number of attempts: 1 Placement Confirmation: positive ETCO2 Dental Injury: Teeth and Oropharynx as per pre-operative assessment

## 2017-09-12 NOTE — Evaluation (Signed)
Physical Therapy Evaluation Patient Details Name: Kristin [name] MRN: [id number] DOB: Jul 23, 1946 Today's Date: 09/12/2017   History of Present Illness  Pt is a 71 year old female s/p L TKA with PMHx significant for breast cancer, DM, and sleep apnea  Clinical Impression  Pt is s/p TKA resulting in the deficits listed below (see PT Problem List).  Pt will benefit from skilled PT to increase their independence and safety with mobility to allow discharge to the venue listed below.  Pt assisted with short distance ambulation on POD #0 and plans to d/c home with spouse.      Follow Up Recommendations Home health PT;Supervision for mobility/OOB    Equipment Recommendations  None recommended by PT    Recommendations for Other Services       Precautions / Restrictions Precautions Precautions: Knee;Fall Required Braces or Orthoses: Knee Immobilizer - Left Restrictions Other Position/Activity Restrictions: WBAT      Mobility  Bed Mobility Overal bed mobility: Needs Assistance Bed Mobility: Supine to Sit;Sit to Supine     Supine to sit: Mod assist;HOB elevated Sit to supine: Min assist   General bed mobility comments: assist for L LE over EOB and trunk upright, assist for LE onto bed  Transfers Overall transfer level: Needs assistance Equipment used: Rolling walker (2 wheeled) Transfers: Sit to/from Stand Sit to Stand: From elevated surface;Min assist         General transfer comment: verbal cues for UE and LE positioning, assist to rise and control descent  Ambulation/Gait Ambulation/Gait assistance: Min guard Ambulation Distance (Feet): 24 Feet Assistive device: Rolling walker (2 wheeled) Gait Pattern/deviations: Step-to pattern;Decreased stance time - left;Decreased weight shift to left;Antalgic     General Gait Details: verbal cues for sequence, RW positioning, step length, posture  Stairs            Wheelchair Mobility    Modified Rankin (Stroke Patients  Only)       Balance                                             Pertinent Vitals/Pain Pain Assessment: 0-10 Pain Score: 5  Pain Location: L knee/ thigh Pain Descriptors / Indicators: Sore;Tightness Pain Intervention(s): Premedicated before session;Monitored during session;Limited activity within patient's tolerance;Repositioned    Home Living Family/patient expects to be discharged to:: Private residence Living Arrangements: Spouse/significant other   Type of Home: House ("retirement community") Home Access: Level entry     Home Layout: One level Home Equipment: Environmental consultant - 2 wheels      Prior Function Level of Independence: Independent               Hand Dominance        Extremity/Trunk Assessment        Lower Extremity Assessment Lower Extremity Assessment: LLE deficits/detail LLE Deficits / Details: unable to perform SLR, able to perform ankle pumps, ROM TBA, maintained KI       Communication   Communication: No difficulties  Cognition Arousal/Alertness: Awake/alert Behavior During Therapy: WFL for tasks assessed/performed Overall Cognitive Status: Within Functional Limits for tasks assessed                                        General Comments      Exercises  Assessment/Plan    PT Assessment Patient needs continued PT services  PT Problem List Decreased range of motion;Decreased strength;Decreased mobility;Decreased knowledge of use of DME;Pain;Decreased knowledge of precautions       PT Treatment Interventions Functional mobility training;Stair training;Gait training;DME instruction;Therapeutic activities;Therapeutic exercise;Patient/family education    PT Goals (Current goals can be found in the Care Plan section)  Acute Rehab PT Goals PT Goal Formulation: With patient/family Time For Goal Achievement: 09/17/17 Potential to Achieve Goals: Good    Frequency 7X/week   Barriers to discharge         Co-evaluation               AM-PAC PT "6 Clicks" Daily Activity  Outcome Measure Difficulty turning over in bed (including adjusting bedclothes, sheets and blankets)?: Unable Difficulty moving from lying on back to sitting on the side of the bed? : Unable Difficulty sitting down on and standing up from a chair with arms (e.g., wheelchair, bedside commode, etc,.)?: Unable Help needed moving to and from a bed to chair (including a wheelchair)?: A Little Help needed walking in hospital room?: A Little Help needed climbing 3-5 steps with a railing? : A Lot 6 Click Score: 11    End of Session Equipment Utilized During Treatment: Gait belt;Left knee immobilizer Activity Tolerance: Patient tolerated treatment well Patient left: in bed;with call bell/phone within reach;with family/visitor present (CPM about to be placed)   PT Visit Diagnosis: Difficulty in walking, not elsewhere classified (R26.2);Pain Pain - Right/Left: Left Pain - part of body: Knee    Time: 1696-7893 PT Time Calculation (min) (ACUTE ONLY): 19 min   Charges:   PT Evaluation $PT Eval Low Complexity: 1 Low     PT G Codes:        Carmelia Bake, PT, DPT 09/12/2017 Pager: [phone]   York Ram E 09/12/2017, 3:38 PM

## 2017-09-12 NOTE — Addendum Note (Signed)
Addendum  created 09/12/17 1733 by Lissa Morales, CRNA   Anesthesia Intra Meds edited

## 2017-09-12 NOTE — Discharge Instructions (Addendum)
Information on my medicine - XARELTO (Rivaroxaban)  This medication education was reviewed with me or my healthcare representative as part of my discharge preparation.   Why was Xarelto prescribed for you? Xarelto was prescribed for you to reduce the risk of blood clots forming after orthopedic surgery. The medical term for these abnormal blood clots is venous thromboembolism (VTE).  What do you need to know about xarelto ? Take your Xarelto ONCE DAILY at the same time every day. You may take it either with or without food.  If you have difficulty swallowing the tablet whole, you may crush it and mix in applesauce just prior to taking your dose.  Take Xarelto exactly as prescribed by your doctor and DO NOT stop taking Xarelto without talking to the doctor who prescribed the medication.  Stopping without other VTE prevention medication to take the place of Xarelto may increase your risk of developing a clot.  After discharge, you should have regular check-up appointments with your healthcare provider that is prescribing your Xarelto.    What do you do if you miss a dose? If you miss a dose, take it as soon as you remember on the same day then continue your regularly scheduled once daily regimen the next day. Do not take two doses of Xarelto on the same day.   Important Safety Information A possible side effect of Xarelto is bleeding. You should call your healthcare provider right away if you experience any of the following: ? Bleeding from an injury or your nose that does not stop. ? Unusual colored urine (red or dark brown) or unusual colored stools (red or black). ? Unusual bruising for unknown reasons. ? A serious fall or if you hit your head (even if there is no bleeding).  Some medicines may interact with Xarelto and might increase your risk of bleeding while on Xarelto. To help avoid this, consult your healthcare provider or pharmacist prior to using any new prescription  or non-prescription medications, including herbals, vitamins, non-steroidal anti-inflammatory drugs (NSAIDs) and supplements.  This website has more information on Xarelto: [email or web address].[email or web address]  INSTRUCTIONS AFTER JOINT REPLACEMENT   o Remove items at home which could result in a fall. This includes throw rugs or furniture in walking pathways o ICE to the affected joint every three hours while awake for 30 minutes at a time, for at least the first 3-5 days, and then as needed for pain and swelling.  Continue to use ice for pain and swelling. You may notice swelling that will progress down to the foot and ankle.  This is normal after surgery.  Elevate your leg when you are not up walking on it.   o Continue to use the breathing machine you got in the hospital (incentive spirometer) which will help keep your temperature down.  It is common for your temperature to cycle up and down following surgery, especially at night when you are not up moving around and exerting yourself.  The breathing machine keeps your lungs expanded and your temperature down.   DIET:  As you were doing prior to hospitalization, we recommend a well-balanced diet.  DRESSING / WOUND CARE / SHOWERING  Keep the surgical dressing until follow up.  The dressing is water proof, so you can shower without any extra covering.  IF THE DRESSING FALLS OFF or the wound gets wet inside, change the dressing with sterile gauze.  Please use good hand washing techniques before changing the dressing.  Do not use any lotions  or creams on the incision until instructed by your surgeon.    ACTIVITY  o Increase activity slowly as tolerated, but follow the weight bearing instructions below.   o No driving for 6 weeks or until further direction given by your physician.  You cannot drive while taking narcotics.  o No lifting or carrying greater than 10 lbs. until further directed by your surgeon. o Avoid periods of inactivity such as sitting longer than  an hour when not asleep. This helps prevent blood clots.  o You may return to work once you are authorized by your doctor.     WEIGHT BEARING   Weight bearing as tolerated with assist device (walker, cane, etc) as directed, use it as long as suggested by your surgeon or therapist, typically at least 4-6 weeks.   EXERCISES  Results after joint replacement surgery are often greatly improved when you follow the exercise, range of motion and muscle strengthening exercises prescribed by your doctor. Safety measures are also important to protect the joint from further injury. Any time any of these exercises cause you to have increased pain or swelling, decrease what you are doing until you are comfortable again and then slowly increase them. If you have problems or questions, call your caregiver or physical therapist for advice.   Rehabilitation is important following a joint replacement. After just a few days of immobilization, the muscles of the leg can become weakened and shrink (atrophy).  These exercises are designed to build up the tone and strength of the thigh and leg muscles and to improve motion. Often times heat used for twenty to thirty minutes before working out will loosen up your tissues and help with improving the range of motion but do not use heat for the first two weeks following surgery (sometimes heat can increase post-operative swelling).   These exercises can be done on a training (exercise) mat, on the floor, on a table or on a bed. Use whatever works the best and is most comfortable for you.    Use music or television while you are exercising so that the exercises are a pleasant break in your day. This will make your life better with the exercises acting as a break in your routine that you can look forward to.   Perform all exercises about fifteen times, three times per day or as directed.  You should exercise both the operative leg and the other leg as well.  Exercises  include:    Quad Sets - Tighten up the muscle on the front of the thigh (Quad) and hold for 5-10 seconds.    Straight Leg Raises - With your knee straight (if you were given a brace, keep it on), lift the leg to 60 degrees, hold for 3 seconds, and slowly lower the leg.  Perform this exercise against resistance later as your leg gets stronger.   Leg Slides: Lying on your back, slowly slide your foot toward your buttocks, bending your knee up off the floor (only go as far as is comfortable). Then slowly slide your foot back down until your leg is flat on the floor again.   Angel Wings: Lying on your back spread your legs to the side as far apart as you can without causing discomfort.   Hamstring Strength:  Lying on your back, push your heel against the floor with your leg straight by tightening up the muscles of your buttocks.  Repeat, but this time bend your knee to a comfortable angle,  and push your heel against the floor.  You may put a pillow under the heel to make it more comfortable if necessary.   A rehabilitation program following joint replacement surgery can speed recovery and prevent re-injury in the future due to weakened muscles. Contact your doctor or a physical therapist for more information on knee rehabilitation.    CONSTIPATION  Constipation is defined medically as fewer than three stools per week and severe constipation as less than one stool per week.  Even if you have a regular bowel pattern at home, your normal regimen is likely to be disrupted due to multiple reasons following surgery.  Combination of anesthesia, postoperative narcotics, change in appetite and fluid intake all can affect your bowels.   YOU MUST use at least one of the following options; they are listed in order of increasing strength to get the job done.  They are all available over the counter, and you may need to use some, POSSIBLY even all of these options:    Drink plenty of fluids (prune juice may be  helpful) and high fiber foods Colace 100 mg by mouth twice a day  Senokot for constipation as directed and as needed Dulcolax (bisacodyl), take with full glass of water  Miralax (polyethylene glycol) once or twice a day as needed.  If you have tried all these things and are unable to have a bowel movement in the first 3-4 days after surgery call either your surgeon or your primary doctor.    If you experience loose stools or diarrhea, hold the medications until you stool forms back up.  If your symptoms do not get better within 1 week or if they get worse, check with your doctor.  If you experience "the worst abdominal pain ever" or develop nausea or vomiting, please contact the office immediately for further recommendations for treatment.   ITCHING:  If you experience itching with your medications, try taking only a single pain pill, or even half a pain pill at a time.  You can also use Benadryl over the counter for itching or also to help with sleep.   TED HOSE STOCKINGS:  Use stockings on both legs until for at least 2 weeks or as directed by physician office. They may be removed at night for sleeping.  MEDICATIONS:  See your medication summary on the After Visit Summary that nursing will review with you.  You may have some home medications which will be placed on hold until you complete the course of blood thinner medication.  It is important for you to complete the blood thinner medication as prescribed.  PRECAUTIONS:  If you experience chest pain or shortness of breath - call 911 immediately for transfer to the hospital emergency department.   If you develop a fever greater that 101 F, purulent drainage from wound, increased redness or drainage from wound, foul odor from the wound/dressing, or calf pain - CONTACT YOUR SURGEON.                                                   FOLLOW-UP APPOINTMENTS:  If you do not already have a post-op appointment, please call the office for an  appointment to be seen by your surgeon.  Guidelines for how soon to be seen are listed in your After Visit Summary, but are typically between 1-4  weeks after surgery.  OTHER INSTRUCTIONS:   Knee Replacement:  Do not place pillow under knee, focus on keeping the knee straight while resting. CPM instructions: 0-90 degrees, 2 hours in the morning, 2 hours in the afternoon, and 2 hours in the evening. Place foam block, curve side up under heel at all times except when in CPM or when walking.  DO NOT modify, tear, cut, or change the foam block in any way.  MAKE SURE YOU:   Understand these instructions.   Get help right away if you are not doing well or get worse.    Thank you for letting us be a part of your medical care team.  It is a privilege we respect greatly.  We hope these instructions will help you stay on track for a fast and full recovery!

## 2017-09-12 NOTE — Progress Notes (Signed)
PHARMACY NOTE -  ANTIBIOTIC RENAL DOSE ADJUSTMENT   Request received for Pharmacy to assist with antibiotic renal dose adjustment.  Patient has been initiated on vancomycin for surgical prophylaxis. SCr 1.78 on 09/04/17 estimated CrCl 28ml/min She was given vanc 1500 mg at 0644 am.  This will cover her for a full 24 hours. Plan: DC vanc 1 gm 12 hrs post-op as she is covered by preop dose Eudelia Bunch, Pharm.D. [phone] 09/12/2017 11:48 AM

## 2017-09-12 NOTE — H&P (Signed)
TOTAL KNEE ADMISSION H&P  Patient is being admitted for left total knee arthroplasty.  Subjective:  Chief Complaint:left knee pain.  HPI: Kristin [name], 71 y.o. female, has a history of pain and functional disability in the left knee due to arthritis and has failed non-surgical conservative treatments for greater than 12 weeks to includeNSAID's and/or analgesics, corticosteriod injections, viscosupplementation injections, flexibility and strengthening excercises, use of assistive devices, weight reduction as appropriate and activity modification.  Onset of symptoms was gradual, starting 3 years ago with gradually worsening course since that time. The patient noted no past surgery on the left knee(s).  Patient currently rates pain in the left knee(s) at 10 out of 10 with activity. Patient has night pain, worsening of pain with activity and weight bearing, pain that interferes with activities of daily living, pain with passive range of motion, crepitus and joint swelling.  Patient has evidence of subchondral sclerosis, periarticular osteophytes and joint space narrowing by imaging studies. There is no active infection.  Patient Active Problem List   Diagnosis Date Noted  . Closed nondisplaced fracture of head of right radius with routine healing 07/10/2017  . Chronic pain of left knee 07/10/2017  . Acute pain of left knee 07/10/2017  . Unilateral primary osteoarthritis, left knee 07/10/2017  . Closed nondisplaced fracture of head of right radius 06/09/2017   Past Medical History:  Diagnosis Date  . Anemia 1992  . Arthritis    oa  . Breast CA ([place]) 1992   lumpectomy with chemo and radiation left breast  . Chronic kidney disease    watching creatining levels sees dr nwbu  . Diabetes mellitus without complication ([place])    type 2   . GERD (gastroesophageal reflux disease)   . Hiatal hernia   . High cholesterol   . Hypertension   . Hypothyroidism   . Personal history of radiation therapy    . Sleep apnea     Past Surgical History:  Procedure Laterality Date  . BACK SURGERY     upper and lower upper back has clamp  . BREAST LUMPECTOMY Left   . CHOLECYSTECTOMY    . TONSILLECTOMY    . TUBAL LIGATION      Prescriptions Prior to Admission  Medication Sig Dispense Refill Last Dose  . acetaminophen (TYLENOL) 325 MG tablet Take 650 mg by mouth every 6 (six) hours as needed for headache (pain).   09/10/2017  . amLODipine (NORVASC) 5 MG tablet Take 5 mg by mouth 2 (two) times daily.    09/12/2017 at 0430  . atorvastatin (LIPITOR) 40 MG tablet Take 40 mg by mouth every evening.    09/11/2017 at Unknown time  . Calcium-Magnesium-Vitamin D (CALCIUM 1200+D3 PO) Take 1 tablet by mouth daily.   09/11/2017 at Unknown time  . colestipol (COLESTID) 1 g tablet Take 2 g by mouth at bedtime.   09/11/2017 at Unknown time  . fluticasone (FLONASE) 50 MCG/ACT nasal spray Place 1 spray into both nostrils at bedtime.    09/11/2017 at Unknown time  . furosemide (LASIX) 20 MG tablet Take 10 mg by mouth daily as needed for edema.   08/29/2017  . gabapentin (NEURONTIN) 300 MG capsule Take 600 mg by mouth at bedtime.   09/11/2017 at Unknown time  . levothyroxine (SYNTHROID, LEVOTHROID) 50 MCG tablet Take 50 mcg by mouth daily before breakfast.    09/12/2017 at 0430  . lisinopril (PRINIVIL,ZESTRIL) 30 MG tablet Take 30 mg by mouth 2 (two) times daily.  09/11/2017 at Unknown time  . Magnesium Oxide (MAG-200 PO) Take 1-2 tablets by mouth See admin instructions. 400 mg in the morning, and 200 mg at bedtime   09/11/2017 at Unknown time  . metFORMIN (GLUCOPHAGE) 500 MG tablet Take 1,000 mg by mouth 2 (two) times daily with a meal.   09/11/2017 at Unknown time  . metoprolol (LOPRESSOR) 50 MG tablet Take 50 mg by mouth 2 (two) times daily.   09/12/2017 at 0430  . Multiple Vitamins-Minerals (MULTIVITAMIN ADULT PO) Take 1 tablet by mouth daily.    08/29/2017  . Omega-3 Fatty Acids (FISH OIL) 1200 MG CAPS Take 1 capsule by mouth  at bedtime.   09/10/2017  . pantoprazole (PROTONIX) 40 MG tablet Take 1 tablet (40 mg total) by mouth 2 (two) times daily. (Patient taking differently: Take 40 mg by mouth every evening. ) 60 tablet 0 09/12/2017 at 0430  . sitaGLIPtin (JANUVIA) 25 MG tablet Take 25 mg by mouth at bedtime.    09/11/2017 at Unknown time  . zolpidem (AMBIEN) 10 MG tablet Take 10 mg by mouth at bedtime.   09/11/2017 at Unknown time   Allergies  Allergen Reactions  . Ibuprofen     Does not take due to kidney function   . Levodopa Other (See Comments)    Increase of dopamine levels    Social History  Substance Use Topics  . Smoking status: Never Smoker  . Smokeless tobacco: Never Used  . Alcohol use Yes     Comment: rare    Family History  Problem Relation Age of Onset  . Breast cancer Mother 31     Review of Systems  Musculoskeletal: Positive for joint pain.  All other systems reviewed and are negative.   Objective:  Physical Exam  Constitutional: She is oriented to person, place, and time. She appears well-developed and well-nourished.  HENT:  Head: Normocephalic and atraumatic.  Eyes: Pupils are equal, round, and reactive to light. EOM are normal.  Neck: Normal range of motion. Neck supple.  Cardiovascular: Normal rate and regular rhythm.   Respiratory: Effort normal and breath sounds normal.  GI: Soft. Bowel sounds are normal.  Musculoskeletal:       Left knee: She exhibits decreased range of motion, swelling, effusion and abnormal alignment. Tenderness found. Medial joint line and lateral joint line tenderness noted.  Neurological: She is alert and oriented to person, place, and time.  Skin: Skin is warm and dry.  Psychiatric: She has a normal mood and affect.    Vital signs in last 24 hours: Temp:  [98.8 F (37.1 C)] 98.8 F (37.1 C) (10/05 0542) Pulse Rate:  [97] 97 (10/05 0542) Resp:  [16] 16 (10/05 0542) BP: (151)/(83) 151/83 (10/05 0542) SpO2:  [96 %] 96 % (10/05 0542) Weight:   [259 lb (117.5 kg)] 259 lb (117.5 kg) (10/05 0616)  Labs:   Estimated body mass index is 43.1 kg/m as calculated from the following:   Height as of this encounter: 5\' 5"  (1.651 m).   Weight as of this encounter: 259 lb (117.5 kg).   Imaging Review Plain radiographs demonstrate severe degenerative joint disease of the left knee(s). The overall alignment ismild varus. The bone quality appears to be good for age and reported activity level.  Assessment/Plan:  End stage arthritis, left knee   The patient history, physical examination, clinical judgment of the provider and imaging studies are consistent with end stage degenerative joint disease of the left knee(s) and total knee  arthroplasty is deemed medically necessary. The treatment options including medical management, injection therapy arthroscopy and arthroplasty were discussed at length. The risks and benefits of total knee arthroplasty were presented and reviewed. The risks due to aseptic loosening, infection, stiffness, patella tracking problems, thromboembolic complications and other imponderables were discussed. The patient acknowledged the explanation, agreed to proceed with the plan and consent was signed. Patient is being admitted for inpatient treatment for surgery, pain control, PT, OT, prophylactic antibiotics, VTE prophylaxis, progressive ambulation and ADL's and discharge planning. The patient is planning to be discharged home with home health services

## 2017-09-12 NOTE — Anesthesia Procedure Notes (Signed)
Spinal  Patient location during procedure: OR End time: 09/12/2017 7:44 AM Staffing Resident/CRNA: Enrigue Catena E Performed: anesthesiologist and resident/CRNA  Preanesthetic Checklist Completed: patient identified, site marked, surgical consent, pre-op evaluation, timeout performed, IV checked, risks and benefits discussed and monitors and equipment checked Spinal Block Patient position: sitting Prep: Betadine and ChloraPrep Patient monitoring: heart rate, continuous pulse ox and blood pressure Approach: midline Location: L3-4 Injection technique: single-shot Needle Needle type: Spinocan  Needle gauge: 24 G Needle length: 9 cm Assessment Sensory level: T10 Additional Notes Expiration date of kit checked and confirmed. Patient tolerated procedure well, without complications.

## 2017-09-12 NOTE — Anesthesia Postprocedure Evaluation (Signed)
Anesthesia Post Note  Patient: Kristin [name]  Procedure(s) Performed: LEFT TOTAL KNEE ARTHROPLASTY (Left Knee)     Patient location during evaluation: PACU Anesthesia Type: Spinal Level of consciousness: sedated, patient cooperative and oriented Pain management: pain level controlled Vital Signs Assessment: post-procedure vital signs reviewed and stable Respiratory status: spontaneous breathing, nonlabored ventilation, respiratory function stable and patient connected to nasal cannula oxygen Cardiovascular status: blood pressure returned to baseline and stable Postop Assessment: patient able to bend at knees and spinal receding Anesthetic complications: no    Last Vitals:  Vitals:   09/12/17 1100 09/12/17 1200  BP: (!) 147/91 135/81  Pulse: 78 74  Resp: 16 16  Temp: 36.4 C 36.5 C  SpO2: 95% 97%    Last Pain:  Vitals:   09/12/17 1245  TempSrc:   PainSc: Asleep                 Dwayn Moravek,E. Willma Obando

## 2017-09-12 NOTE — Op Note (Signed)
NAME:  Kristin [name], Kristin [name]                     ACCOUNT NO.:  MEDICAL RECORD NO.:  [id number]  LOCATION:                                 FACILITY:  PHYSICIAN:  Lind Guest. 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  POSTOPERATIVE DIAGNOSIS:  Primary osteoarthritis and degenerative joint disease of the left knee.  PROCEDURE:  Left total knee arthroplasty.  IMPLANTS:  Stryker Triathlon knee with size 3 femur, size 4 tibial tray, 11 mm fix-bearing polyethylene insert, size 28 patellar button.  SURGEON:  Lind Guest. Ninfa Linden, M.D.  ASSISTANT:  Erskine Emery, PA-C.  ANESTHESIA: 1. Left lower extremity regional adductor canal block. 2. Spinal.  ANTIBIOTICS:  __________ of IV vancomycin.  TOURNIQUET TIME:  Less than 1 hour.  BLOOD LOSS:  About 100 mL.  COMPLICATIONS:  None.  INDICATIONS:  [name] is a __________ female, well known to me.  She has debilitating arthritis involving her left knee and had multiple injections over the years.  Her pain has now become daily and has detrimentally affected her activities of daily living, her quality of life, and her mobility.  At this point, she has tried and failed all forms of conservative treatment and does wish to proceed with a total knee arthroplasty.  She understands the risk of acute blood loss anemia, nerve and vessel injury, fracture, infection, and DVT.  She understands our goals are to decrease pain, improve mobility, and overall improve quality of life.  PROCEDURE DESCRIPTION:  After informed consent was obtained, appropriate left knee was marked.  Anesthesia obtained an adductor canal block.  She was then brought to the operating room, placed supine on the operative table and then sat up for spinal anesthesia that was obtained, and  then she was laid back in a supine position.  A Foley catheter was placed.  A nonsterile tourniquet was placed around her upper left thigh and her left leg was prepped and draped from the thigh down the ankle with DuraPrep and sterile drapes including a sterile stockinette.  A time-out was called and she was identified as correct patient and correct left hip.  I then wrapped out the leg with an Esmarch and the tourniquet was inflated to 300 mmHg.  We then made an incision over the patella and carried this proximally and distally.  We dissected down the knee joint and carried out a medial parapatellar arthrotomy.  We found a moderate joint effusion and significant arthritis throughout her knee.  We removed remnants of ACL, PCL, medial, and lateral meniscus as well as osteophytes from the knee.  With the knee in a flexed position, we used extramedullary cutting guide for the tibia, first making our proximal tibia cut.  We corrected varus, valgus, and neutral slope, and set our cutting guide taking 9 mm off the high side.  We made this cut without difficulty.  We then went to the femur and used the intramedullary guide for distal femoral cut, setting this through the notch of the  knee and setting our distal femoral cut for the left knee at 5 degrees externally rotated for an 8 mm distal femoral cut.  We made this cut without difficulty and brought the knee back down in extension.  We cleaned more debris from the back of the knee and then placed a 9 mm extension block and we felt good about our extension gap.  We then went back to the femur and put our femoral sizing guide based off the epicondylar axis and chose a size 3 femur.  We put a 4-in-1 cutting block for a size 3 femur, made our anterior and posterior cuts followed by our chamfer cuts.  We then made our femoral box cut for a size 3 femur.  Attention was then turned back to the tibia.  We chose a size 4 tibial tray to the tibia and  set the rotation off the tibial tubercle and the femur and made a drill hole for universal base plate and then a keel cut as well. We then had the 4 trial tibia followed by 3 trial femur placed.  We placed 11-mm fix-bearing polyethylene insert given her size, and I was pleased with the range of motion, stability, and overall alignment.  We then drilled 3 holes for a size 28 patellar button.  We then removed all instrumentation and mixed our cement.  We cleaned the knee and dried it after irrigating with normal saline solution.  Once we mixed our cement, we then cemented the real Stryker Triathlon universal base plate, tibial tray size 4 for the left knee and a size 3 left femur.  We placed a real 11 mm fix-bearing polyethylene insert after cleaning cement debris from the knee and cemented our patellar button.  We held the knee in an extended position to allow the cement to harden.  We then put the knee through range of motion.  We were pleased with the range of motion and stability.  We then irrigated the knee with normal saline solution using pulsatile lavage.  We let her tourniquet down and hemostasis was obtained with electrocautery.  We then closed the arthrotomy with interrupted #1 Vicryl suture followed by 0 Vicryl in the deep tissue, 2- 0 Vicryl in the subcutaneous tissue, interrupted staples on the skin, and well-padded sterile dressing was applied.  She was taken to the recovery room in stable condition.  All final counts were correct. There were no complications noted.  Of note, Erskine Emery, PA-C, assisted in the entire case, his assistance was crucial for facilitating all aspects of this case.     Lind Guest. Ninfa Linden, M.D.     CYB/MEDQ  D:  09/12/2017  T:  09/12/2017  Job:  [id number]

## 2017-09-13 LAB — CBC
HCT: 28.3 % — ABNORMAL LOW (ref 36.0–46.0)
HEMOGLOBIN: 9.6 g/dL — AB (ref 12.0–15.0)
MCH: 32 pg (ref 26.0–34.0)
MCHC: 33.9 g/dL (ref 30.0–36.0)
MCV: 94.3 fL (ref 78.0–100.0)
PLATELETS: 238 10*3/uL (ref 150–400)
RBC: 3 MIL/uL — AB (ref 3.87–5.11)
RDW: 13.4 % (ref 11.5–15.5)
WBC: 15.3 10*3/uL — AB (ref 4.0–10.5)

## 2017-09-13 LAB — BASIC METABOLIC PANEL
ANION GAP: 11 (ref 5–15)
BUN: 34 mg/dL — ABNORMAL HIGH (ref 6–20)
CHLORIDE: 102 mmol/L (ref 101–111)
CO2: 18 mmol/L — ABNORMAL LOW (ref 22–32)
Calcium: 8.3 mg/dL — ABNORMAL LOW (ref 8.9–10.3)
Creatinine, Ser: 1.53 mg/dL — ABNORMAL HIGH (ref 0.44–1.00)
GFR calc Af Amer: 38 mL/min — ABNORMAL LOW (ref >60)
GFR, EST NON AFRICAN AMERICAN: 33 mL/min — AB (ref >60)
Glucose, Bld: 230 mg/dL — ABNORMAL HIGH (ref 65–99)
POTASSIUM: 5 mmol/L (ref 3.5–5.1)
SODIUM: 131 mmol/L — AB (ref 135–145)

## 2017-09-13 MED ORDER — RIVAROXABAN 10 MG PO TABS: 10.0000 mg | ORAL_TABLET | Freq: Every day | ORAL | 0 refills | Status: DC

## 2017-09-13 MED ORDER — METHOCARBAMOL 500 MG PO TABS: 500.0000 mg | ORAL_TABLET | Freq: Four times a day (QID) | ORAL | 0 refills | Status: DC | PRN

## 2017-09-13 MED ORDER — OXYCODONE-ACETAMINOPHEN 5-325 MG PO TABS: 1.0000 | ORAL_TABLET | ORAL | 0 refills | Status: DC | PRN

## 2017-09-13 NOTE — Progress Notes (Signed)
Physical Therapy Treatment Patient Details Name: Kristin [name] MRN: [id number] DOB: Dec 07, 1946 Today's Date: 09/13/2017    History of Present Illness Pt is a 71 year old female s/p L TKA with PMHx significant for breast cancer, DM, and sleep apnea    PT Comments    Progressing slowly, likely will need SNF unless pt makes tremendous  Progress ove the next 1-2 days  Follow Up Recommendations  Supervision/Assistance - 24 hour;SNF     Equipment Recommendations  None recommended by PT    Recommendations for Other Services       Precautions / Restrictions Precautions Precautions: Knee;Fall Precaution Comments: reviewed/instructed in donning and doffing KI Required Braces or Orthoses: Knee Immobilizer - Left Knee Immobilizer - Left: Discontinue once straight leg raise with < 10 degree lag Restrictions Weight Bearing Restrictions: No Other Position/Activity Restrictions: WBAT    Mobility  Bed Mobility Overal bed mobility: Needs Assistance Bed Mobility: Supine to Sit;Sit to Supine     Supine to sit: +2 for physical assistance;Mod assist;Max assist;+2 for safety/equipment Sit to supine: +2 for physical assistance;+2 for safety/equipment;Max assist;Total assist   General bed mobility comments: assist with trunk and LLE to sit upright, assist with trunk and bil LEs to supine;  multi-modal cues and incr time needed  Transfers Overall transfer level: Needs assistance Equipment used: Rolling walker (2 wheeled) Transfers: Sit to/from [employer] Transfers Sit to Stand: +2 safety/equipment;+2 physical assistance;From elevated surface;Min assist;Mod assist Stand pivot transfers: Min assist;Mod assist;+2 physical assistance;+2 safety/equipment       General transfer comment: verbal cues for UE and LE positioning, assist to rise and control descent and advance LLE forward   Ambulation/Gait Ambulation/Gait assistance: Mod assist;+2 physical assistance;+2 safety/equipment;Min  assist Ambulation Distance (Feet): 4 Feet (pivotal steps) Assistive device: Rolling walker (2 wheeled) Gait Pattern/deviations: Step-to pattern;Decreased stance time - left;Decreased weight shift to left;Antalgic     General Gait Details: multi-modal cues for sequence, safety and technique; requires and exorbitant amount of time to complete    Stairs            Wheelchair Mobility    Modified Rankin (Stroke Patients Only)       Balance Overall balance assessment: Needs assistance Sitting-balance support: Feet supported;Bilateral upper extremity supported Sitting balance-Leahy Scale: Fair     Standing balance support: Bilateral upper extremity supported;During functional activity Standing balance-Leahy Scale: Poor Standing balance comment: reliant on therapists' support and UE/RW                            Cognition Arousal/Alertness: Suspect due to medications (falls asleep easily) Behavior During Therapy: WFL for tasks assessed/performed Overall Cognitive Status: Impaired/Different from baseline Area of Impairment: Following commands;Problem solving;Safety/judgement                       Following Commands: Follows one step commands with increased time;Follows multi-step commands inconsistently Safety/Judgement: Decreased awareness of deficits   Problem Solving: Slow processing;Decreased initiation;Difficulty sequencing;Requires verbal cues;Requires tactile cues General Comments: pt requires step by step cues for basic functional tasks, requires multi-modal cues for safety, techniques and sequencing for all tasks      Exercises Total Joint Exercises Ankle Circles/Pumps: AROM;10 reps;Both Quad Sets: AROM;Strengthening;Both;10 reps Hip ABduction/ADduction: AAROM;Left;10 reps Straight Leg Raises: AAROM;Left;10 reps Goniometric ROM: grossly 10-35*    General Comments        Pertinent Vitals/Pain Pain Assessment: 0-10 Pain Score: 6  Pain  Location:  L knee/ thigh Pain Descriptors / Indicators: Grimacing;Guarding;Sore Pain Intervention(s): Limited activity within patient's tolerance;Monitored during session;Premedicated before session    Home Living                      Prior Function            PT Goals (current goals can now be found in the care plan section) Acute Rehab PT Goals Patient Stated Goal: none stated PT Goal Formulation: With patient/family Time For Goal Achievement: 09/17/17 Potential to Achieve Goals: Good Progress towards PT goals: Progressing toward goals (slowly)    Frequency    7X/week      PT Plan Discharge plan needs to be updated    Co-evaluation PT/OT/SLP Co-Evaluation/Treatment: Yes Reason for Co-Treatment: For patient/therapist safety;To address functional/ADL transfers PT goals addressed during session: Mobility/safety with mobility;Proper use of DME        AM-PAC PT "6 Clicks" Daily Activity  Outcome Measure  Difficulty turning over in bed (including adjusting bedclothes, sheets and blankets)?: Unable Difficulty moving from lying on back to sitting on the side of the bed? : Unable Difficulty sitting down on and standing up from a chair with arms (e.g., wheelchair, bedside commode, etc,.)?: Unable Help needed moving to and from a bed to chair (including a wheelchair)?: A Lot Help needed walking in hospital room?: A Lot Help needed climbing 3-5 steps with a railing? : Total 6 Click Score: 8    End of Session Equipment Utilized During Treatment: Gait belt;Left knee immobilizer Activity Tolerance: Patient limited by fatigue;Patient limited by pain Patient left: in bed;with call bell/phone within reach;with family/visitor present;with bed alarm set   PT Visit Diagnosis: Difficulty in walking, not elsewhere classified (R26.2);Pain Pain - Right/Left: Left Pain - part of body: Knee     Time: 1340-1406 PT Time Calculation (min) (ACUTE ONLY): 26 min  Charges:   $Therapeutic Exercise: 8-22 mins $Therapeutic Activity: 8-22 mins                    G CodesKenyon Ana, PT Pager: [phone] 09/13/2017    Kenyon Ana 09/13/2017, 2:48 PM

## 2017-09-13 NOTE — Progress Notes (Signed)
09/13/17 1400  PT Visit Information  Last PT Received On 09/13/17   Pt is progressing very slowly; she is sleepy, likely d/t pain meds; she is requiring +2 assist for bed mobility and stand pivot transfers as well as taking an excessive amount of time to complete basic functional tasks (ie--stand pivot to [facility] takes ~30 mins) Pt may need SNF--pt and husband reside at [facility] in [place] +2  PT/OT/SLP Co-Evaluation/Treatment Yes  Reason for Co-Treatment For patient/therapist safety;To address functional/ADL transfers  PT goals addressed during session Mobility/safety with mobility;Proper use of DME  History of Present Illness Pt is a 71 year old female s/p L TKA with PMHx significant for breast cancer, DM, and sleep apnea  Subjective Data  Patient Stated Goal none stated  Precautions  Precautions Knee;Fall  Required Braces or Orthoses Knee Immobilizer - Left  Knee Immobilizer - Left Discontinue once straight leg raise with < 10 degree lag  Restrictions  Weight Bearing Restrictions No  Other Position/Activity Restrictions WBAT  Pain Assessment  Pain Assessment 0-10  Pain Score 6  Pain Location L knee/ thigh  Pain Descriptors / Indicators Grimacing;Guarding;Sore  Pain Intervention(s) Limited activity within patient's tolerance;Monitored during session;Premedicated before session  Cognition  Arousal/Alertness Suspect due to medications (falls asleep easily)  Behavior During Therapy [facility] for tasks assessed/performed  Overall Cognitive Status Impaired/Different from baseline  Area of Impairment Following commands;Problem solving;Safety/judgement  Following Commands Follows one step commands with increased time;Follows multi-step commands inconsistently  Safety/Judgement Decreased awareness of deficits  Problem Solving Slow processing;Decreased initiation;Difficulty sequencing;Requires verbal cues;Requires tactile cues  General Comments pt requires step by step  cues for basic functional tasks, requires multi-modal cues for safety, techniques and sequencing for all tasks  Bed Mobility  Overal bed mobility Needs Assistance  Bed Mobility Supine to Sit;Sit to Supine  Supine to sit +2 for physical assistance;Mod assist;Max assist;+2 for safety/equipment  Sit to supine +2 for physical assistance;+2 for safety/equipment;Max assist;Total assist  General bed mobility comments assist with trunk and LLE to sit upright, assist with trunk and bil LEs to supine;  multi-modal cues and incr time needed  Transfers  Overall transfer level Needs assistance  Equipment used Rolling walker (2 wheeled)  Transfers Sit to/from Stand;Stand Pivot Transfers  Sit to Stand +2 safety/equipment;+2 physical assistance;From elevated surface;Min assist;Mod assist  Stand pivot transfers Min assist;Mod assist;+2 physical assistance;+2 safety/equipment  General transfer comment verbal cues for UE and LE positioning, assist to rise and control descent and advance LLE forward   Ambulation/Gait  Ambulation/Gait assistance Mod assist;+2 physical assistance;+2 safety/equipment;Min assist  Ambulation Distance (Feet) 4 Feet (pivotal steps)  Assistive device Rolling walker (2 wheeled)  Gait Pattern/deviations Step-to pattern;Decreased stance time - left;Decreased weight shift to left;Antalgic  General Gait Details multi-modal cues for sequence, safety and technique; requires and exorbitant amount of time to complete   Balance  Overall balance assessment Needs assistance  Sitting-balance support Feet supported;Bilateral upper extremity supported  Sitting balance-Leahy Scale Fair  Standing balance support Bilateral upper extremity supported;During functional activity  Standing balance-Leahy Scale Poor  Standing balance comment reliant on therapists' support and UE/RW  PT - End of Session  Equipment Utilized During Treatment Gait belt;Left knee immobilizer  Activity Tolerance Patient limited  by fatigue;Patient limited by pain  Patient left in bed;with call bell/phone within reach;with family/visitor present;with bed alarm set  PT - Assessment/Plan  PT Plan Discharge plan needs to be updated  PT Visit Diagnosis Difficulty in walking,  not elsewhere classified (R26.2);Pain  Pain - Right/Left Left  Pain - part of body Knee  PT Frequency (ACUTE ONLY) 7X/week  Follow Up Recommendations Supervision/Assistance - 24 hour;SNF  PT equipment None recommended by PT  AM-PAC PT "6 Clicks" Daily Activity Outcome Measure  Difficulty turning over in bed (including adjusting bedclothes, sheets and blankets)? 1  Difficulty moving from lying on back to sitting on the side of the bed?  1  Difficulty sitting down on and standing up from a chair with arms (e.g., wheelchair, bedside commode, etc,.)? 1  Help needed moving to and from a bed to chair (including a wheelchair)? 2  Help needed walking in hospital room? 2  Help needed climbing 3-5 steps with a railing?  1  6 Click Score 8  Mobility G Code  CM  PT Goal Progression  Progress towards PT goals Progressing toward goals (slowly)  Acute Rehab PT Goals  PT Goal Formulation With patient/family  Time For Goal Achievement 09/17/17  Potential to Achieve Goals Good  PT Time Calculation  PT Start Time (ACUTE ONLY) 1340  PT Stop Time (ACUTE ONLY) 1406  PT Time Calculation (min) (ACUTE ONLY) 26 min  PT General Charges  $$ ACUTE PT VISIT 1 Visit  PT Treatments  $Therapeutic Activity 8-22 mins

## 2017-09-13 NOTE — Progress Notes (Signed)
Occupational Therapy Evaluation Patient Details Name: Kristin [name] MRN: [id number] DOB: [birth year] Today's Date: 09/13/2017    History of Present Illness Pt is a 71 year old female s/p L TKA with PMHx significant for breast cancer, DM, and sleep apnea   Clinical Impression   Patient very lethargic; requiring +2 assistance for [facility] transfer and did not tolerate further activity. May need SNF.    Follow Up Recommendations  Supervision/Assistance - 24 hour;SNF;Home health OT    Equipment Recommendations  Other (comment) (tbd)    Recommendations for Other Services       Precautions / Restrictions Precautions Precautions: Knee;Fall Precaution Comments: reviewed/instructed in donning and doffing KI Required Braces or Orthoses: Knee Immobilizer - Left Knee Immobilizer - Left: Discontinue once straight leg raise with < 10 degree lag Restrictions Weight Bearing Restrictions: No Other Position/Activity Restrictions: WBAT      Mobility Bed Mobility Overal bed mobility: Needs Assistance Bed Mobility: Supine to Sit;Sit to Supine     Supine to sit: +2 for physical assistance;Mod assist;Max assist;+2 for safety/equipment Sit to supine: +2 for physical assistance;+2 for safety/equipment;Max assist;Total assist   General bed mobility comments: assist with trunk and LLE to sit upright, assist with trunk and bil LEs to supine;  multi-modal cues and incr time needed  Transfers Overall transfer level: Needs assistance Equipment used: Rolling walker (2 wheeled) Transfers: Sit to/from [employer] Transfers Sit to Stand: +2 safety/equipment;+2 physical assistance;From elevated surface;Min assist;Mod assist Stand pivot transfers: Min assist;Mod assist;+2 physical assistance;+2 safety/equipment       General transfer comment: verbal cues for UE and LE positioning, assist to rise and control descent and advance LLE forward     Balance Overall balance assessment: Needs  assistance Sitting-balance support: Feet supported;Bilateral upper extremity supported Sitting balance-Leahy Scale: Fair     Standing balance support: Bilateral upper extremity supported;During functional activity Standing balance-Leahy Scale: Poor Standing balance comment: reliant on therapists' support and UE/RW                           ADL either performed or assessed with clinical judgement   ADL Overall ADL's : Needs assistance/impaired Eating/Feeding: Minimal assistance;Bed level   Grooming: Minimal assistance;Bed level                   Toilet Transfer: Moderate assistance;+2 for physical assistance;+2 for safety/equipment;Stand-pivot;BSC;RW           Functional mobility during ADLs: Moderate assistance;+2 for physical assistance;+2 for safety/equipment;Cueing for safety;Rolling walker General ADL Comments: may need SNF     Vision         Perception     Praxis      Pertinent Vitals/Pain Pain Assessment: 0-10 Pain Score: 6  Pain Location: L knee/ thigh Pain Descriptors / Indicators: Grimacing;Guarding;Sore Pain Intervention(s): Limited activity within patient's tolerance;Monitored during session     Hand Dominance     Extremity/Trunk Assessment Upper Extremity Assessment Upper Extremity Assessment: Generalized weakness   Lower Extremity Assessment Lower Extremity Assessment: Defer to PT evaluation       Communication Communication Communication: No difficulties   Cognition Arousal/Alertness: Suspect due to medications Behavior During Therapy: WFL for tasks assessed/performed Overall Cognitive Status: Impaired/Different from baseline Area of Impairment: Following commands;Problem solving;Safety/judgement                       Following Commands: Follows one step commands with increased time;Follows multi-step commands inconsistently Safety/Judgement:  Decreased awareness of deficits   Problem Solving: Slow  processing;Decreased initiation;Difficulty sequencing;Requires verbal cues;Requires tactile cues General Comments: pt requires step by step cues for basic functional tasks, requires multi-modal cues for safety, techniques and sequencing for all tasks   General Comments  very lethargic during session    Exercises    Shoulder Instructions      Home Living Family/patient expects to be discharged to:: Private residence Living Arrangements: Spouse/significant other Available Help at Discharge: Family Type of Home: House Home Access: Level entry     Home Layout: One level     Bathroom Shower/Tub: Occupational psychologist: Handicapped height Bathroom Accessibility: Yes How Accessible: Accessible via walker Home Equipment: Environmental consultant - 2 wheels   Additional Comments: Lives at [facility]      Prior Functioning/Environment Level of Independence: Independent                 OT Problem List: Decreased strength;Decreased range of motion;Decreased activity tolerance;Impaired balance (sitting and/or standing);Decreased safety awareness;Decreased knowledge of use of DME or AE;Pain;Cardiopulmonary status limiting activity      OT Treatment/Interventions: Self-care/ADL training;DME and/or AE instruction;Therapeutic activities;Patient/family education    OT Goals(Current goals can be found in the care plan section) Acute Rehab OT Goals Patient Stated Goal: none stated OT Goal Formulation: With patient Time For Goal Achievement: 09/27/17 Potential to Achieve Goals: Good  OT Frequency: Min 2X/week   Barriers to D/C:            Co-evaluation PT/OT/SLP Co-Evaluation/Treatment: Yes Reason for Co-Treatment: For patient/therapist safety PT goals addressed during session: Mobility/safety with mobility OT goals addressed during session: ADL's and self-care      AM-PAC PT "6 Clicks" Daily Activity     Outcome Measure Help from another person eating meals?: A Little Help  from another person taking care of personal grooming?: A Little Help from another person toileting, which includes using toliet, bedpan, or urinal?: A Lot Help from another person bathing (including washing, rinsing, drying)?: A Lot Help from another person to put on and taking off regular upper body clothing?: A Lot Help from another person to put on and taking off regular lower body clothing?: A Lot 6 Click Score: 14   End of Session Equipment Utilized During Treatment: Rolling walker;Gait belt;Left knee immobilizer Nurse Communication: Mobility status  Activity Tolerance: Patient limited by lethargy;Patient limited by pain Patient left: in bed;with call bell/phone within reach;with family/visitor present  OT Visit Diagnosis: Unsteadiness on feet (R26.81);Muscle weakness (generalized) (M62.81)                Time: 1340-1406 OT Time Calculation (min): 26 min Charges:  OT General Charges $OT Visit: 1 Visit OT Evaluation $OT Eval Moderate Complexity: 1 Mod G-Codes:       Nevea Spiewak A Harlo Fabela 10-02-2017, 2:53 PM

## 2017-09-13 NOTE — Progress Notes (Signed)
Received foley order from Dr. Ninfa Linden.

## 2017-09-13 NOTE — Progress Notes (Signed)
Patient ID: Kristin [name], female   DOB: 28-Jun-1946, 71 y.o.   MRN: [id number] Patient is postoperative day 1 left total knee arthroplasty. Patient is progressing with therapy she has no complaints this morning. Plan for discharge when safe with therapy.

## 2017-09-13 NOTE — Progress Notes (Addendum)
No void after foley was discontinued, 565cc bladder scan residual.  Dr. Trevor Mace office paged, awaiting call back for orders.

## 2017-09-14 LAB — CBC
HCT: 25.7 % — ABNORMAL LOW (ref 36.0–46.0)
Hemoglobin: 8.9 g/dL — ABNORMAL LOW (ref 12.0–15.0)
MCH: 32.5 pg (ref 26.0–34.0)
MCHC: 34.6 g/dL (ref 30.0–36.0)
MCV: 93.8 fL (ref 78.0–100.0)
PLATELETS: 194 10*3/uL (ref 150–400)
RBC: 2.74 MIL/uL — AB (ref 3.87–5.11)
RDW: 13.6 % (ref 11.5–15.5)
WBC: 12.3 10*3/uL — AB (ref 4.0–10.5)

## 2017-09-14 NOTE — Care Management Note (Signed)
Case Management Note  Patient Details  Name: Kristin [name] MRN: [id number] Date of Birth: 10-15-1946  Subjective/Objective:   S/p L TKA                 Action/Plan: Discharge Planning: NCM spoke to pt and husband at bedside. Pt states she lives at [facility] and states they do have SNF rehab. Contacted CSW with referral to rehab.   PCP Javier Glazier MD  Expected Discharge Date:                 Expected Discharge Plan:  [place]  In-House Referral:  Clinical Social Work  Discharge planning Services  CM Consult  Post Acute Care Choice:  Home Health Choice offered to:  Patient  DME Arranged:  N/A DME Agency:  NA  HH Arranged:  PT [place] Agency:  [facility] [employer]  Status of Service:  Completed, signed off  If discussed at [employer] [facility] [employer], dates discussed:    Additional Comments:  Erenest Rasher, RN 09/14/2017, 10:55 AM

## 2017-09-14 NOTE — Progress Notes (Signed)
Patient ID: Kristin [name], female   DOB: 13-Feb-1946, 71 y.o.   MRN: [id number] Patient making slow progress with physical therapy. Plan for discharge to skilled nursing facility on Monday. Hemoglobin 8.9.

## 2017-09-14 NOTE — NC FL2 (Signed)
[place] LEVEL OF CARE SCREENING TOOL     IDENTIFICATION  Patient Name: Kristin [name] Birthdate: 09-Apr-1946 Sex: female Admission Date (Current Location): 09/12/2017  [facility] and [place] Number:  Herbalist and Address:  [facility] [place] [address] [place], [place] [address]      Provider Number: [id number]  Attending Physician Name and Address:  Mcarthur Rossetti,*  Relative Name and Phone Number:  Calie Buttrey, spouse, [phone]    Current Level of Care: Hospital Recommended Level of Care: [place] Prior Approval Number:    Date Approved/Denied:   PASRR Number: [id number]  Discharge Plan: Home    Current Diagnoses: Patient Active Problem List   Diagnosis Date Noted  . 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 Closed nondisplaced fracture of head of right radius with routine healing 07/10/2017  . Chronic pain of left knee 07/10/2017  . Acute pain of left knee 07/10/2017  . Unilateral primary osteoarthritis, left knee 07/10/2017  . Closed nondisplaced fracture of head of right radius 06/09/2017    Orientation RESPIRATION BLADDER Height & Weight     Self, Time, Situation, Place  Other (Comment) (CPAP) Incontinent, Indwelling catheter Weight: 259 lb (117.5 kg) Height:  5\' 5"  (165.1 cm)  BEHAVIORAL SYMPTOMS/MOOD NEUROLOGICAL BOWEL NUTRITION STATUS      Continent Diet (please see DC summary)  AMBULATORY STATUS COMMUNICATION OF NEEDS Skin   Limited Assist Verbally Surgical wounds (closed incision L knee, compression wrap)                       Personal Care Assistance Level of Assistance  Bathing, Feeding, Dressing Bathing Assistance: Maximum assistance Feeding assistance: Independent Dressing Assistance: Maximum assistance     Functional Limitations Info  Sight, Hearing, Speech Sight Info: Adequate Hearing Info: Adequate Speech Info: Adequate    SPECIAL CARE FACTORS  FREQUENCY  PT (By licensed PT), OT (By licensed OT)     PT Frequency: 5x/week OT Frequency: 5x/week            Contractures Contractures Info: Not present    Additional Factors Info  Code Status, Allergies Code Status Info: Full Allergies Info: Ibuprofen, Levodopa           Current Medications (09/14/2017):  This is the current hospital active medication list Current Facility-Administered Medications  Medication Dose Route Frequency Provider Last Rate Last Dose  . 0.9 %  sodium chloride infusion   Intravenous Continuous Mcarthur Rossetti, MD 75 mL/hr at 09/12/17 2320    . acetaminophen (TYLENOL) tablet 650 mg  650 mg Oral Q6H PRN Mcarthur Rossetti, MD   650 mg at 09/14/17 1402   Or  . acetaminophen (TYLENOL) suppository 650 mg  650 mg Rectal Q6H PRN Mcarthur Rossetti, MD      . amLODipine (NORVASC) tablet 5 mg  5 mg Oral BID Mcarthur Rossetti, MD   5 mg at 09/14/17 0930  . atorvastatin (LIPITOR) tablet 40 mg  40 mg Oral QPM Mcarthur Rossetti, MD   40 mg at 09/13/17 1926  . colestipol (COLESTID) tablet 2 g  2 g Oral QHS Mcarthur Rossetti, MD   2 g at 09/13/17 2204  . diphenhydrAMINE (BENADRYL) 12.5 MG/5ML elixir 12.5-25 mg  12.5-25 mg Oral Q4H PRN Mcarthur Rossetti, MD      . docusate sodium (COLACE) capsule 100 mg  100 mg Oral BID Mcarthur Rossetti, MD  100 mg at 09/14/17 0930  . fluticasone (FLONASE) 50 MCG/ACT nasal spray 1 spray  1 spray Each Nare QHS Mcarthur Rossetti, MD   1 spray at 09/13/17 2215  . furosemide (LASIX) tablet 10 mg  10 mg Oral Daily PRN Mcarthur Rossetti, MD      . gabapentin (NEURONTIN) capsule 600 mg  600 mg Oral QHS Mcarthur Rossetti, MD   600 mg at 09/13/17 2203  . HYDROmorphone (DILAUDID) injection 1 mg  1 mg Intravenous Q2H PRN Mcarthur Rossetti, MD   1 mg at 09/13/17 1138  . levothyroxine (SYNTHROID, LEVOTHROID) tablet 50 mcg  50 mcg Oral QAC breakfast Mcarthur Rossetti, MD    50 mcg at 09/14/17 [phone]  . linagliptin (TRADJENTA) tablet 5 mg  5 mg Oral Daily Mcarthur Rossetti, MD   5 mg at 09/14/17 0930  . lisinopril (PRINIVIL,ZESTRIL) tablet 30 mg  30 mg Oral BID Mcarthur Rossetti, MD   30 mg at 09/14/17 0930  . menthol-cetylpyridinium (CEPACOL) lozenge 3 mg  1 lozenge Oral PRN Mcarthur Rossetti, MD       Or  . phenol (CHLORASEPTIC) mouth spray 1 spray  1 spray Mouth/Throat PRN Mcarthur Rossetti, MD      . metFORMIN (GLUCOPHAGE) tablet 1,000 mg  1,000 mg Oral BID WC Mcarthur Rossetti, MD   1,000 mg at 09/14/17 0930  . methocarbamol (ROBAXIN) tablet 500 mg  500 mg Oral Q6H PRN Mcarthur Rossetti, MD   500 mg at 09/14/17 1402   Or  . methocarbamol (ROBAXIN) 500 mg in dextrose 5 % 50 mL IVPB  500 mg Intravenous Q6H PRN Mcarthur Rossetti, MD   Stopped at 09/12/17 1030  . metoCLOPramide (REGLAN) tablet 5-10 mg  5-10 mg Oral Q8H PRN Mcarthur Rossetti, MD       Or  . metoCLOPramide (REGLAN) injection 5-10 mg  5-10 mg Intravenous Q8H PRN Mcarthur Rossetti, MD      . metoprolol tartrate (LOPRESSOR) tablet 50 mg  50 mg Oral BID Mcarthur Rossetti, MD   50 mg at 09/14/17 0930  . ondansetron (ZOFRAN) tablet 4 mg  4 mg Oral Q6H PRN Mcarthur Rossetti, MD       Or  . ondansetron [facility]) injection 4 mg  4 mg Intravenous Q6H PRN Mcarthur Rossetti, MD      . oxyCODONE (Oxy IR/ROXICODONE) immediate release tablet 5-10 mg  5-10 mg Oral Q3H PRN Mcarthur Rossetti, MD   10 mg at 09/14/17 1402  . pantoprazole (PROTONIX) EC tablet 40 mg  40 mg Oral QPM Mcarthur Rossetti, MD   40 mg at 09/13/17 1926  . polyethylene glycol (MIRALAX / GLYCOLAX) packet 17 g  17 g Oral Daily PRN Mcarthur Rossetti, MD      . rivaroxaban [facility]) tablet 10 mg  10 mg Oral Q breakfast Mcarthur Rossetti, MD   10 mg at 09/14/17 6301  . zolpidem (AMBIEN) tablet 5 mg  5 mg Oral QHS Mcarthur Rossetti, MD   5 mg at 09/13/17  2203     Discharge Medications: Please see discharge summary for a list of discharge medications.  Relevant Imaging Results:  Relevant Lab Results:   Additional Information SSN: [id number]  Estanislado Emms, LCSW

## 2017-09-14 NOTE — Clinical Social Work Note (Signed)
Clinical Social Work Assessment  Patient Details  Name: Kristin [name] MRN: [id number] Date of Birth: 1946/02/26  Date of referral:  09/14/17               Reason for consult:  Facility Placement                Permission sought to share information with:  Facility Sport and exercise psychologist, Family Supports Permission granted to share information::  Yes, Verbal Permission Granted  Name::     Kristin [name]  Agency::  [facility]  Relationship::  spouse  Contact Information:  [phone]  Housing/Transportation Living arrangements for the past 2 months:  [place] of Information:  Patient, Spouse Patient Interpreter Needed:  None Criminal Activity/Legal Involvement Pertinent to Current Situation/Hospitalization:  No - Comment as needed Significant Relationships:  Spouse, Adult Children Lives with:  Spouse, Facility Resident Do you feel safe going back to the place where you live?  Yes Need for family participation in patient care:  No (Coment)  Care giving concerns: Patient from [place] with [place]. PT recommending SNF.    Social Worker assessment / plan: CSW met with patient and spouse at bedside. Patient agreeable to go to [facility]. Patient lives in their [place] with spouse. Patient uses CPAP at home- reported she can bring CPAP from [facility] into SNF. CSW sent initial referral to [facility]. Will follow for placement.  Employment status:  Retired Forensic scientist:  Medicare PT Recommendations:  [place] / Referral to community resources:  [place]  Patient/Family's Response to care:  Patient and spouse appreciative of care.  Patient/Family's Understanding of and Emotional Response to Diagnosis, Current Treatment, and Prognosis: Patient and husband with good understanding of treatment and recommendation for rehab. [facility]ep[facility] for [facility] at [facility].  Emotional Assessment Appearance:   Appears stated age Attitude/Demeanor/Rapport:  Other (appropriate) Affect (typically observed):  Calm Orientation:  Oriented to Self, Oriented to Place, Oriented to  Time, Oriented to Situation Alcohol / Substance use:  Not Applicable Psych involvement (Current and /or in the community):  No (Comment)  Discharge Needs  Concerns to be addressed:  Discharge Planning Concerns Readmission within the last 30 days:  No Current discharge risk:  Physical Impairment Barriers to Discharge:  Continued Medical Work up   Estanislado Emms, LCSW 09/14/2017, 4:31 PM

## 2017-09-14 NOTE — Progress Notes (Signed)
Occupational Therapy Treatment Patient Details Name: Dayane Hillenburg MRN: [id number] DOB: [birth year] Today's Date: 09/14/2017    History of present illness Pt is a 71 year old female s/p L TKA with PMHx significant for breast cancer, DM, and sleep apnea   OT comments  Limited session this morning as patient awaiting breakfast so she can have pain medication. She is more awake and alert. Educated on LB self-care. Will continue to follow for ADL transfers.   Follow Up Recommendations  Supervision/Assistance - 24 hour;SNF;Home health OT    Equipment Recommendations  Other (comment) (tbd)    Recommendations for Other Services      Precautions / Restrictions Precautions Precautions: Knee;Fall Required Braces or Orthoses: Knee Immobilizer - Left Knee Immobilizer - Left: Discontinue once straight leg raise with < 10 degree lag       Mobility Bed Mobility                  Transfers                      Balance                                           ADL either performed or assessed with clinical judgement   ADL Overall ADL's : Needs assistance/impaired                                       General ADL Comments: Patient received in bed. Foley had to be replaced overnight due to inability to void. Patient more awake and conversive this morning. Patient declined EOB/OOB activities due to not having pain meds yet. Spoke to nurse who said patient has not eaten breakfast yet and will give patient pain meds at that time. Patient had ordered breakfast and was told it would be delivered in 45 minutes. Educated patient on LB dressing and bathing techniques. Patient verbalized understanding. Patient requested ice on knee. Replaced ice into ice packs and applied. OT will continue to follow.     Vision       Perception     Praxis      Cognition Arousal/Alertness: Awake/alert Behavior During Therapy: WFL for tasks  assessed/performed Overall Cognitive Status: Within Functional Limits for tasks assessed                                          Exercises     Shoulder Instructions       General Comments      Pertinent Vitals/ Pain       Pain Assessment: 0-10 Pain Score: 7  Pain Location: L knee/ thigh Pain Descriptors / Indicators: Aching;Sore Pain Intervention(s): Limited activity within patient's tolerance;Ice applied  Home Living                                          Prior Functioning/Environment              Frequency  Min 2X/week        Progress Toward Goals  OT Goals(current goals can now be found  in the care plan section)  Progress towards OT goals: Progressing toward goals     Plan Discharge plan remains appropriate    Co-evaluation                 AM-PAC PT "6 Clicks" Daily Activity     Outcome Measure   Help from another person eating meals?: None Help from another person taking care of personal grooming?: A Little Help from another person toileting, which includes using toliet, bedpan, or urinal?: A Lot Help from another person bathing (including washing, rinsing, drying)?: A Lot Help from another person to put on and taking off regular upper body clothing?: A Lot Help from another person to put on and taking off regular lower body clothing?: A Lot 6 Click Score: 15    End of Session    OT Visit Diagnosis: Unsteadiness on feet (R26.81);Muscle weakness (generalized) (M62.81)   Activity Tolerance Patient limited by pain   Patient Left in bed;with call bell/phone within reach   Nurse Communication Patient requests pain meds        Time: 3888-7579 OT Time Calculation (min): 14 min  Charges: OT General Charges $OT Visit: 1 Visit OT Treatments $Self Care/Home Management : 8-22 mins     Chavie Kolinski A Ameriah Lint 09/14/2017, 8:59 AM

## 2017-09-14 NOTE — Progress Notes (Signed)
Physical Therapy Treatment Patient Details Name: Kristin [name] MRN: [id number] DOB: 1946/10/23 Today's Date: 09/14/2017    History of Present Illness Pt is a 71 year old female s/p L TKA with PMHx significant for breast cancer, DM, and obesity, sleep apnea    PT Comments    Continues to progress slowly; extremely deconditioned and would benefit from SNF   Follow Up Recommendations  Supervision/Assistance - 24 hour;SNF     Equipment Recommendations  None recommended by PT    Recommendations for Other Services       Precautions / Restrictions Precautions Precautions: Knee;Fall Precaution Comments: reviewed/instructed in donning and doffing KI Required Braces or Orthoses: Knee Immobilizer - Left Knee Immobilizer - Left: Discontinue once straight leg raise with < 10 degree lag Restrictions Weight Bearing Restrictions: No Other Position/Activity Restrictions: WBAT    Mobility  Bed Mobility Overal bed mobility: Needs Assistance Bed Mobility: Sit to Supine     Supine to sit: Max assist Sit to supine: Mod assist;Max assist   General bed mobility comments: assist with LEs onto bed; max cues   for sequence, technique and self assist; incr time needed;   Transfers Overall transfer level: Needs assistance Equipment used: Rolling walker (2 wheeled) Transfers: Sit to/from [facility] Sit to Stand: Mod assist;+2 physical assistance;+2 safety/equipment Stand pivot transfers: Min assist;Mod assist;+2 physical assistance;+2 safety/equipment       General transfer comment: verbal cues for UE and LE positioning, assist to rise and control descent and advance LLE forward   Ambulation/Gait Ambulation/Gait assistance: Min assist;+2 safety/equipment Ambulation Distance (Feet): 15 Feet Assistive device: Rolling walker (2 wheeled) Gait Pattern/deviations: Step-to pattern;Decreased stance time - left;Decreased weight shift to left;Antalgic   Gait velocity  interpretation: Below normal speed for age/gender General Gait Details:  (pivotal steps only d/t rapid fatigue and pain)   Stairs            Wheelchair Mobility    Modified Rankin (Stroke Patients Only)       Balance           Standing balance support: Bilateral upper extremity supported;During functional activity Standing balance-Leahy Scale: Poor Standing balance comment: reliant on therapist's support and UE/RW                            Cognition Arousal/Alertness: Awake/alert Behavior During Therapy: WFL for tasks assessed/performed Overall Cognitive Status: Within Functional Limits for tasks assessed                                        Exercises Total Joint Exercises    General Comments        Pertinent Vitals/Pain Pain Assessment: 0-10 Pain Score: 5  Pain Location: L knee/ thigh Pain Descriptors / Indicators: Aching;Sore Pain Intervention(s): Monitored during session;Premedicated before session    Home Living                      Prior Function            PT Goals (current goals can now be found in the care plan section) Acute Rehab PT Goals Patient Stated Goal: none stated PT Goal Formulation: With patient/family Time For Goal Achievement: 09/17/17 Potential to Achieve Goals: Good Progress towards PT goals: Progressing toward goals    Frequency    7X/week  PT Plan Current plan remains appropriate    Co-evaluation              AM-PAC PT "6 Clicks" Daily Activity  Outcome Measure  Difficulty turning over in bed (including adjusting bedclothes, sheets and blankets)?: Unable Difficulty moving from lying on back to sitting on the side of the bed? : Unable Difficulty sitting down on and standing up from a chair with arms (e.g., wheelchair, bedside commode, etc,.)?: Unable Help needed moving to and from a bed to chair (including a wheelchair)?: A Lot Help needed walking in hospital  room?: Total Help needed climbing 3-5 steps with a railing? : Total 6 Click Score: 7    End of Session Equipment Utilized During Treatment: Gait belt;Left knee immobilizer Activity Tolerance: Patient limited by fatigue;Patient limited by pain Patient left: in bed;with call bell/phone within reach;with family/visitor present   PT Visit Diagnosis: Difficulty in walking, not elsewhere classified (R26.2);Pain Pain - Right/Left: Left Pain - part of body: Knee     Time: 1426-1440 PT Time Calculation (min) (ACUTE ONLY): 14 min  Charges:  $Gait Training: 8-22 mins $Therapeutic Exercise: 8-22 mins $Therapeutic Activity: 8-22 mins                    G Codes:           Jhoel Stieg 09/14/2017, 2:43 PM

## 2017-09-14 NOTE — Progress Notes (Signed)
Physical Therapy Treatment Patient Details Name: Kristin [name] MRN: [id number] DOB: 14-Mar-1946 Today's Date: 09/14/2017    History of Present Illness Pt is a 71 year old female s/p L TKA with PMHx significant for breast cancer, DM, and sleep apnea    PT Comments    Pt continues to progress slowly; will benefit from SNF post acute;    Follow Up Recommendations  Supervision/Assistance - 24 hour;SNF     Equipment Recommendations  None recommended by PT    Recommendations for Other Services       Precautions / Restrictions Precautions Precautions: Knee;Fall Required Braces or Orthoses: Knee Immobilizer - Left Knee Immobilizer - Left: Discontinue once straight leg raise with < 10 degree lag Restrictions Weight Bearing Restrictions: No Other Position/Activity Restrictions: WBAT    Mobility  Bed Mobility Overal bed mobility: Needs Assistance Bed Mobility: Supine to Sit     Supine to sit: Max assist     General bed mobility comments: assist with trunk to upright and LEs off bed, partial roll, pt utilized rail, incr time  Transfers Overall transfer level: Needs assistance Equipment used: Rolling walker (2 wheeled) Transfers: Sit to/from Stand Sit to Stand: Mod assist;From elevated surface         General transfer comment: verbal cues for UE and LE positioning, assist to rise and control descent and advance LLE forward   Ambulation/Gait Ambulation/Gait assistance: Min assist;+2 safety/equipment Ambulation Distance (Feet): 15 Feet Assistive device: Rolling walker (2 wheeled) Gait Pattern/deviations: Step-to pattern;Decreased stance time - left;Decreased weight shift to left;Antalgic   Gait velocity interpretation: Below normal speed for age/gender General Gait Details: multi-modal cues for sequence, safety and technique; requires and exorbitant amount of time to complete    Stairs            Wheelchair Mobility    Modified Rankin (Stroke Patients Only)        Balance           Standing balance support: Bilateral upper extremity supported;During functional activity Standing balance-Leahy Scale: Poor Standing balance comment: reliant on therapists' support and UE/RW                            Cognition Arousal/Alertness: Awake/alert Behavior During Therapy: WFL for tasks assessed/performed Overall Cognitive Status: Within Functional Limits for tasks assessed                                        Exercises Total Joint Exercises Ankle Circles/Pumps: AROM;10 reps;Both Quad Sets: AROM;Strengthening;Both;10 reps Short Arc QuadSinclair Ship;Left;10 reps Heel Slides: AAROM;PROM;Left;10 reps Hip ABduction/ADduction: AAROM;Left;10 reps Straight Leg Raises: AAROM;Left;10 reps Goniometric ROM: severely limited ROM, pt guarding 10 to 30*    General Comments        Pertinent Vitals/Pain Pain Assessment: 0-10 Pain Score: 5  Pain Location: L knee/ thigh Pain Descriptors / Indicators: Aching;Sore Pain Intervention(s): Limited activity within patient's tolerance;Monitored during session;Premedicated before session;Ice applied    Home Living                      Prior Function            PT Goals (current goals can now be found in the care plan section) Acute Rehab PT Goals Patient Stated Goal: none stated PT Goal Formulation: With patient/family Time For Goal Achievement: 09/17/17 Potential to  Achieve Goals: Good Progress towards PT goals: Progressing toward goals    Frequency    7X/week      PT Plan Current plan remains appropriate    Co-evaluation              AM-PAC PT "6 Clicks" Daily Activity  Outcome Measure  Difficulty turning over in bed (including adjusting bedclothes, sheets and blankets)?: Unable Difficulty moving from lying on back to sitting on the side of the bed? : Unable Difficulty sitting down on and standing up from a chair with arms (e.g., wheelchair,  bedside commode, etc,.)?: Unable Help needed moving to and from a bed to chair (including a wheelchair)?: A Lot Help needed walking in hospital room?: A Lot Help needed climbing 3-5 steps with a railing? : Total 6 Click Score: 8    End of Session Equipment Utilized During Treatment: Gait belt;Left knee immobilizer Activity Tolerance: Patient limited by fatigue;Patient limited by pain Patient left: in chair;with call bell/phone within reach;with family/visitor present   PT Visit Diagnosis: Difficulty in walking, not elsewhere classified (R26.2);Pain Pain - Right/Left: Left Pain - part of body: Knee     Time: 0998-3382 PT Time Calculation (min) (ACUTE ONLY): 29 min  Charges:  $Gait Training: 8-22 mins $Therapeutic Exercise: 8-22 mins                    G Codes:          Kato Wieczorek Oct 07, 2017, 12:21 PM

## 2017-09-15 LAB — GLUCOSE, CAPILLARY
GLUCOSE-CAPILLARY: 183 mg/dL — AB (ref 65–99)
Glucose-Capillary: 231 mg/dL — ABNORMAL HIGH (ref 65–99)

## 2017-09-15 LAB — CBC
HCT: 25.4 % — ABNORMAL LOW (ref 36.0–46.0)
HEMATOCRIT: 26.6 % — AB (ref 36.0–46.0)
HEMOGLOBIN: 9 g/dL — AB (ref 12.0–15.0)
HEMOGLOBIN: 8.5 g/dL — AB (ref 12.0–15.0)
MCH: 32.1 pg (ref 26.0–34.0)
MCH: 32 pg (ref 26.0–34.0)
MCHC: 33.8 g/dL (ref 30.0–36.0)
MCHC: 33.5 g/dL (ref 30.0–36.0)
MCV: 95 fL (ref 78.0–100.0)
MCV: 95.5 fL (ref 78.0–100.0)
PLATELETS: 191 10*3/uL (ref 150–400)
PLATELETS: 207 10*3/uL (ref 150–400)
RBC: 2.8 MIL/uL — AB (ref 3.87–5.11)
RBC: 2.66 MIL/uL — AB (ref 3.87–5.11)
RDW: 13.5 % (ref 11.5–15.5)
RDW: 13.8 % (ref 11.5–15.5)
WBC: 12.4 10*3/uL — AB (ref 4.0–10.5)
WBC: 11.5 10*3/uL — ABNORMAL HIGH (ref 4.0–10.5)

## 2017-09-15 NOTE — Progress Notes (Signed)
Patient ID: Kristin [name], female   DOB: Apr 02, 1946, 71 y.o.   MRN: [id number] Doing well overall.  Vitals stable and left knee stable.  The plan is now for discharge to [facility] today.

## 2017-09-15 NOTE — Care Management Important Message (Signed)
Important Message  Patient Details IM Letter given to Kathy/Case Manager to present to Patient Name: Kristin [name] MRN: [id number] Date of Birth: 13-Jun-1946   Medicare Important Message Given:  Yes    Kerin Salen 09/15/2017, 10:17 AM

## 2017-09-15 NOTE — Progress Notes (Signed)
Patient confused and not answering questions appropiately, VSS. Dr Ninfa Linden Paged with orders received/ D Mateo Flow RN

## 2017-09-15 NOTE — Discharge Summary (Signed)
Patient ID: Kristin [name] MRN: [id number] DOB/AGE: 12/13/1945 71 y.o.  Admit date: 09/12/2017 Discharge date: 09/15/2017  Admission Diagnoses:  Principal Problem:   Unilateral primary osteoarthritis, left knee Active Problems:   Status post total left knee replacement   Discharge Diagnoses:  Same  Past Medical History:  Diagnosis Date  . Anemia 1992  . Arthritis    oa  . Breast CA ([place]) 1992   lumpectomy with chemo and radiation left breast  . Chronic kidney disease    watching creatining levels sees dr nwbu  . Diabetes mellitus without complication ([place])    type 2   . GERD (gastroesophageal reflux disease)   . Hiatal hernia   . High cholesterol   . Hypertension   . Hypothyroidism   . Personal history of radiation therapy   . Sleep apnea     Surgeries: Procedure(s): LEFT TOTAL KNEE ARTHROPLASTY on 09/12/2017   Consultants:   Discharged Condition: Improved  Hospital Course: Kristin [name] is an 71 y.o. female who was admitted 09/12/2017 for operative treatment ofUnilateral primary osteoarthritis, left knee. Patient has severe unremitting pain that affects sleep, daily activities, and work/hobbies. After pre-op clearance the patient was taken to the operating room on 09/12/2017 and underwent  Procedure(s): LEFT TOTAL KNEE ARTHROPLASTY.    Patient was given perioperative antibiotics: Anti-infectives    Start     Dose/Rate Route Frequency Ordered Stop   09/12/17 1115  vancomycin (VANCOCIN) IVPB 1000 mg/200 mL premix  Status:  Discontinued     1,000 mg 200 mL/hr over 60 Minutes Intravenous Every 12 hours 09/12/17 1109 09/12/17 1149   09/12/17 0600  ceFAZolin (ANCEF) 3 g in dextrose 5 % 50 mL IVPB  Status:  Discontinued     3 g 130 mL/hr over 30 Minutes Intravenous On call to O.R. 09/11/17 1237 09/11/17 1509   09/12/17 0537  ceFAZolin (ANCEF) 2-4 GM/100ML-% IVPB  Status:  Discontinued    Comments:  [place] Session   : cabinet override      09/12/17 0537 09/12/17 0556    09/12/17 0500  vancomycin (VANCOCIN) 1,500 mg in sodium chloride 0.9 % 500 mL IVPB    Comments:  Please adjust to weight based dose   1,500 mg 250 mL/hr over 120 Minutes Intravenous  Once 09/11/17 1510 09/12/17 0844       Patient was given sequential compression devices, early ambulation, and chemoprophylaxis to prevent DVT.  Patient benefited maximally from hospital stay and there were no complications.    Recent vital signs: Patient Vitals for the past 24 hrs:  BP Temp Temp src Pulse Resp SpO2  09/15/17 0518 (!) 142/73 98.6 F (37 C) Oral 100 16 91 %  09/14/17 2329 (!) 145/68 97.6 F (36.4 C) Oral (!) 102 16 96 %  09/14/17 1320 135/66 98.4 F (36.9 C) Oral 92 16 97 %     Recent laboratory studies:  Recent Labs  09/13/17 0526 09/14/17 0523 09/15/17 0539  WBC 15.3* 12.3* 12.4*  HGB 9.6* 8.9* 9.0*  HCT 28.3* 25.7* 26.6*  PLT 238 194 191  NA 131*  --   --   K 5.0  --   --   CL 102  --   --   CO2 18*  --   --   BUN 34*  --   --   CREATININE 1.53*  --   --   GLUCOSE 230*  --   --   CALCIUM 8.3*  --   --  Discharge Medications:   Allergies as of 09/15/2017      Reactions   Ibuprofen    Does not take due to kidney function    Levodopa Other (See Comments)   Increase of dopamine levels      Medication List    TAKE these medications   acetaminophen 325 MG tablet Commonly known as:  TYLENOL Take 650 mg by mouth every 6 (six) hours as needed for headache (pain).   amLODipine 5 MG tablet Commonly known as:  NORVASC Take 5 mg by mouth 2 (two) times daily.   atorvastatin 40 MG tablet Commonly known as:  LIPITOR Take 40 mg by mouth every evening.   CALCIUM 1200+D3 PO Take 1 tablet by mouth daily.   colestipol 1 g tablet Commonly known as:  COLESTID Take 2 g by mouth at bedtime.   Fish Oil 1200 MG Caps Take 1 capsule by mouth at bedtime.   fluticasone 50 MCG/ACT nasal spray Commonly known as:  FLONASE Place 1 spray into both nostrils at bedtime.    gabapentin 300 MG capsule Commonly known as:  NEURONTIN Take 600 mg by mouth at bedtime.   LASIX 20 MG tablet Generic drug:  furosemide Take 10 mg by mouth daily as needed for edema.   levothyroxine 50 MCG tablet Commonly known as:  SYNTHROID, LEVOTHROID Take 50 mcg by mouth daily before breakfast.   lisinopril 30 MG tablet Commonly known as:  PRINIVIL,ZESTRIL Take 30 mg by mouth 2 (two) times daily.   MAG-200 PO Take 1-2 tablets by mouth See admin instructions. 400 mg in the morning, and 200 mg at bedtime   metFORMIN 500 MG tablet Commonly known as:  GLUCOPHAGE Take 1,000 mg by mouth 2 (two) times daily with a meal.   methocarbamol 500 MG tablet Commonly known as:  ROBAXIN Take 1 tablet (500 mg total) by mouth every 6 (six) hours as needed for muscle spasms.   metoprolol tartrate 50 MG tablet Commonly known as:  LOPRESSOR Take 50 mg by mouth 2 (two) times daily.   MULTIVITAMIN ADULT PO Take 1 tablet by mouth daily.   oxyCODONE-acetaminophen 5-325 MG tablet Commonly known as:  ROXICET Take 1-2 tablets by mouth every 4 (four) hours as needed.   pantoprazole 40 MG tablet Commonly known as:  PROTONIX Take 1 tablet (40 mg total) by mouth 2 (two) times daily. What changed:  when to take this   rivaroxaban 10 MG Tabs tablet Commonly known as:  XARELTO Take 1 tablet (10 mg total) by mouth daily with breakfast.   sitaGLIPtin 25 MG tablet Commonly known as:  JANUVIA Take 25 mg by mouth at bedtime.   zolpidem 10 MG tablet Commonly known as:  AMBIEN Take 10 mg by mouth at bedtime.            Durable Medical Equipment        Start     Ordered   09/12/17 1110  DME 3 n 1  Once     09/12/17 1109   09/12/17 1110  DME Walker rolling  Once    Question:  Patient needs a walker to treat with the following condition  Answer:  Status post total left knee replacement   09/12/17 1109      Diagnostic Studies: Dg Knee Left Port  Result Date: 09/12/2017 CLINICAL  DATA:  Status post total knee replacement on the left EXAM: PORTABLE LEFT KNEE - 1-2 VIEW COMPARISON:  None. FINDINGS: Frontal and lateral views were obtained.  The patient is status post total knee replacement with femoral and tibial prosthetic components well-seated. No acute fracture or dislocation. Air in the joint is an expected postoperative finding. IMPRESSION: Prosthetic components well-seated.  No fracture or dislocation. Electronically Signed   By: Lowella Grip III M.D.   On: 09/12/2017 09:59   Mm Screening Breast Tomo Bilateral  Result Date: 08/27/2017 CLINICAL DATA:  Screening. EXAM: 2D DIGITAL SCREENING BILATERAL MAMMOGRAM WITH CAD AND ADJUNCT TOMO COMPARISON:  Previous exam(s). ACR Breast Density Category a: The breast tissue is almost entirely fatty. FINDINGS: There are no findings suspicious for malignancy. Stable postsurgical changes on the left. Images were processed with CAD. IMPRESSION: No mammographic evidence of malignancy. A result letter of this screening mammogram will be mailed directly to the patient. RECOMMENDATION: Screening mammogram in one year. (Code:[id number]-B-01Y) BI-RADS CATEGORY  2: Benign. Electronically Signed   By: Lajean Manes M.D.   On: 08/27/2017 15:07    Disposition: to skilled nursing facility  Discharge Instructions    Discharge patient    Complete by:  As directed    Discharge disposition:  03-Skilled [place]   Discharge patient date:  09/15/2017      Follow-up Information    Mcarthur Rossetti, MD Follow up in 2 week(s).   Specialty:  Orthopedic Surgery Contact information: [place] [address] [phone]        [facility] Follow up.   Specialty:  [place] Why:  Home Health Physical Therapy- will call to arrange initial visit Contact information: [place] [address] [phone]            Signed: Mcarthur Rossetti 09/15/2017, 7:02  AM

## 2017-09-15 NOTE — Clinical Social Work Placement (Signed)
   CLINICAL SOCIAL WORK PLACEMENT  NOTE  Date:  09/15/2017  Patient Details  Name: Kristin [name] MRN: [id number] Date of Birth: 01-11-1946  Clinical Social Work is seeking post-discharge placement for this patient at the [place] level of care (*CSW will initial, date and re-position this form in  chart as items are completed):  Yes   Patient/family provided with [facility] Work Department's list of facilities offering this level of care within the geographic area requested by the patient (or if unable, by the patient's family).  Yes   Patient/family informed of their freedom to choose among providers that offer the needed level of care, that participate in Medicare, Medicaid or managed care program needed by the patient, have an available bed and are willing to accept the patient.  Yes   Patient/family informed of [facility]'s ownership interest in [facility] and [facility], as well as of the fact that they are under no obligation to receive care at these facilities.  PASRR submitted to EDS on 09/15/17     PASRR number received on 09/15/17     Existing PASRR number confirmed on 09/15/17     FL2 transmitted to all facilities in geographic area requested by pt/family on       FL2 transmitted to all facilities within larger geographic area on       Patient informed that his/her managed care company has contracts with or will negotiate with certain facilities, including the following:        Yes   Patient/family informed of bed offers received.  Patient chooses bed at [facility]     Physician recommends and patient chooses bed at [facility] [place]    Patient to be transferred to [facility] [place] [place] [facility] on 09/15/17.  Patient to be transferred to facility by EMS     Patient family notified on 09/15/17 of transfer.  Name of family member notified:  Family notified     PHYSICIAN Please sign FL2      Additional Comment:    _______________________________________________ Lilly [place], LCSW 09/15/2017, 8:42 AM

## 2017-09-15 NOTE — Progress Notes (Signed)
Physical Therapy Treatment Patient Details Name: Kristin [name] MRN: [id number] DOB: [birth year] Today's Date: 09/15/2017    History of Present Illness Pt is a 71 year old female s/p L TKA with PMHx significant for breast cancer, DM, and obesity, sleep apnea    PT Comments    Pod # 3 Applied KI and instructed pt and family on use and proper placement.  Assisted to EOB required + 2 assist and use of bed pad to complete scooting.   Pt required + 2 assist to stand and was only able to advance gait to 4 feet due to pain/fatigue.  Required rest breaks between each activity.  Pt plans to D/C to SNF.   Follow Up Recommendations  Supervision/Assistance - 24 hour;SNF     Equipment Recommendations  None recommended by PT    Recommendations for Other Services       Precautions / Restrictions Precautions Precautions: Knee;Fall Precaution Comments: reviewed/instructed in donning and doffing KI Required Braces or Orthoses: Knee Immobilizer - Left Knee Immobilizer - Left: Discontinue once straight leg raise with < 10 degree lag Restrictions Weight Bearing Restrictions: No Other Position/Activity Restrictions: WBAT    Mobility  Bed Mobility Overal bed mobility: Needs Assistance Bed Mobility: Supine to Sit     Supine to sit: Max assist;Total assist;+2 for physical assistance;+2 for safety/equipment     General bed mobility comments: required increased assist to transfer from supine to EOB with use of bed pad to complete scooting   Transfers Overall transfer level: Needs assistance Equipment used: Rolling walker (2 wheeled) Transfers: Sit to/from [facility] Sit to Stand: Mod assist;+2 physical assistance;+2 safety/equipment;Max assist Stand pivot transfers: Mod assist;+2 physical assistance;+2 safety/equipment;Max assist       General transfer comment: 75% VC's on proper tech, proper hand placement and L LE advancement prior to stand/sit.    Ambulation/Gait Ambulation/Gait assistance: +2 safety/equipment;Mod assist Ambulation Distance (Feet): 4 Feet Assistive device: Rolling walker (2 wheeled) Gait Pattern/deviations: Step-to pattern;Decreased stance time - left;Decreased weight shift to left;Antalgic Gait velocity: decreased x 3   General Gait Details: 50% VC's on proper walker advancement, to increase step length and upright posture.  very slow, short stepped gait.  Unsteady.  Recliner following for safety.     Stairs            Wheelchair Mobility    Modified Rankin (Stroke Patients Only)       Balance                                            Cognition Arousal/Alertness: Awake/alert Behavior During Therapy: WFL for tasks assessed/performed Overall Cognitive Status: Within Functional Limits for tasks assessed Area of Impairment: Safety/judgement;Awareness                       Following Commands: Follows one step commands with increased time;Follows multi-step commands inconsistently     Problem Solving: Slow processing;Decreased initiation;Difficulty sequencing;Requires verbal cues;Requires tactile cues General Comments: required repeat instructions (meds?) and required increased time to repond/process      Exercises      General Comments        Pertinent Vitals/Pain Pain Assessment: 0-10 Pain Score: 7  Pain Location: L knee/ thigh Pain Descriptors / Indicators: Aching;Sore Pain Intervention(s): Monitored during session;Repositioned;Ice applied    Home Living  Prior Function            PT Goals (current goals can now be found in the care plan section) Progress towards PT goals: Progressing toward goals    Frequency    7X/week      PT Plan Current plan remains appropriate    Co-evaluation              AM-PAC PT "6 Clicks" Daily Activity  Outcome Measure  Difficulty turning over in bed (including adjusting  bedclothes, sheets and blankets)?: Unable Difficulty moving from lying on back to sitting on the side of the bed? : Unable Difficulty sitting down on and standing up from a chair with arms (e.g., wheelchair, bedside commode, etc,.)?: Unable Help needed moving to and from a bed to chair (including a wheelchair)?: A Lot Help needed walking in hospital room?: Total Help needed climbing 3-5 steps with a railing? : Total 6 Click Score: 7    End of Session Equipment Utilized During Treatment: Gait belt;Left knee immobilizer Activity Tolerance: Patient limited by fatigue;Patient limited by pain Patient left: in chair;with call bell/phone within reach;with family/visitor present   PT Visit Diagnosis: Difficulty in walking, not elsewhere classified (R26.2);Pain Pain - Right/Left: Left Pain - part of body: Knee     Time: 5102-5852 PT Time Calculation (min) (ACUTE ONLY): 30 min  Charges:  $Gait Training: 8-22 mins $Therapeutic Activity: 8-22 mins                    G Codes:       Rica Koyanagi  PTA [facility]  Acute  Rehab Pager      [phone]

## 2017-09-15 NOTE — Progress Notes (Addendum)
LCSW following for disposition:  From [facility], needing SNF.  LCSW spoke with Candace at facility. Agreeable to transition patient into [facility] SNF as recommended within hospital stay. Patient able to transfer today per Candace.  All paperwork sent via HUB for review. Will anticipate patient to transfer by EMS.  LCSW spoke with patient to discuss transportation. She feels able to ride in car. Reports her husband will pick her up. She requests to eat lunch prior to leaving hospital. RN made aware.  Report:  [phone]  Family to be updated/notified of plans.Husband aware of plans per patient. Will be arriving to hospital.  Lane Hacker, MSW Clinical Social Work: System [employer] Float Coverage for :  [phone]

## 2017-09-16 LAB — GLUCOSE, CAPILLARY
GLUCOSE-CAPILLARY: 206 mg/dL — AB (ref 65–99)
GLUCOSE-CAPILLARY: 219 mg/dL — AB (ref 65–99)

## 2017-09-16 NOTE — Progress Notes (Signed)
Pt. Found on home unit, room air and tolerating well.

## 2017-09-16 NOTE — Progress Notes (Signed)
Physical Therapy Treatment Patient Details Name: Kristin [name] MRN: [id number] DOB: 02-02-1946 Today's Date: 09/16/2017    History of Present Illness Pt is a 71 year old female s/p L TKA with PMHx significant for breast cancer, DM, and obesity, sleep apnea    PT Comments    POD # 4 Applied KI and assisted OOB to amb a greater but still limited distance.  Assisted on off [facility] when PT[facility] arrived.  Assisted onto stretcher.    Follow Up Recommendations  SNF     Equipment Recommendations  None recommended by PT    Recommendations for Other Services       Precautions / Restrictions Precautions Precautions: Knee;Fall Precaution Comments: instructed on KI use for amb Required Braces or Orthoses: Knee Immobilizer - Left Knee Immobilizer - Left: Discontinue once straight leg raise with < 10 degree lag Restrictions Weight Bearing Restrictions: No Other Position/Activity Restrictions: WBAT    Mobility  Bed Mobility Overal bed mobility: Needs Assistance Bed Mobility: Supine to Sit     Supine to sit: Max assist;Total assist;+2 for physical assistance;+2 for safety/equipment Sit to supine: Min assist   General bed mobility comments: assist for LLE and utilizing bed pad to complete scooting to EOB  Transfers Overall transfer level: Needs assistance Equipment used: Rolling walker (2 wheeled) Transfers: Sit to/from [facility] Sit to Stand: Mod assist;+2 physical assistance;+2 safety/equipment;Max assist Stand pivot transfers: Mod assist;+2 physical assistance;+2 safety/equipment;Max assist       General transfer comment: 75% VC's on proper tech, proper hand placement and L LE advancement prior to stand/sit.   Ambulation/Gait Ambulation/Gait assistance: +2 safety/equipment;Mod assist Ambulation Distance (Feet): 12 Feet Assistive device: Rolling walker (2 wheeled) Gait Pattern/deviations: Step-to pattern;Decreased stance time - left;Decreased weight shift to  left;Antalgic Gait velocity: decreased   General Gait Details: 50% VC's on proper walker advancement, to increase step length and upright posture.  very slow, short stepped gait.  Unsteady.  Recliner following for safety.     Stairs            Wheelchair Mobility    Modified Rankin (Stroke Patients Only)       Balance             Standing balance-Leahy Scale: Poor Standing balance comment: loss of balance in standing                            Cognition Arousal/Alertness: Awake/alert Behavior During Therapy: WFL for tasks assessed/performed Overall Cognitive Status: Within Functional Limits for tasks assessed Area of Impairment: Safety/judgement;Awareness                         Safety/Judgement: Decreased awareness of deficits     General Comments: followed one step commands      Exercises      General Comments        Pertinent Vitals/Pain Pain Assessment: 0-10 Pain Score: 6  Pain Location: L knee/ thigh Pain Descriptors / Indicators: Aching;Sore;Operative site guarding Pain Intervention(s): Monitored during session;Repositioned;Ice applied    Home Living                      Prior Function            PT Goals (current goals can now be found in the care plan section) Progress towards PT goals: Progressing toward goals    Frequency    7X/week  PT Plan Current plan remains appropriate    Co-evaluation              AM-PAC PT "6 Clicks" Daily Activity  Outcome Measure  Difficulty turning over in bed (including adjusting bedclothes, sheets and blankets)?: Unable Difficulty moving from lying on back to sitting on the side of the bed? : Unable Difficulty sitting down on and standing up from a chair with arms (e.g., wheelchair, bedside commode, etc,.)?: Unable Help needed moving to and from a bed to chair (including a wheelchair)?: A Lot Help needed walking in hospital room?: Total Help needed  climbing 3-5 steps with a railing? : Total 6 Click Score: 7    End of Session Equipment Utilized During Treatment: Gait belt;Left knee immobilizer Activity Tolerance: Patient limited by fatigue;Patient limited by pain Patient left: in chair;with call bell/phone within reach;with family/visitor present   PT Visit Diagnosis: Difficulty in walking, not elsewhere classified (R26.2);Pain Pain - Right/Left: Left Pain - part of body: Knee     Time: 1011-1036 PT Time Calculation (min) (ACUTE ONLY): 25 min  Charges:  $Gait Training: 8-22 mins $Therapeutic Activity: 8-22 mins                    G Codes:       Rica Koyanagi  PTA [facility]  Acute  Rehab Pager      [phone]

## 2017-09-16 NOTE — Discharge Summary (Signed)
Patient ID: Kristin [name] MRN: [id number] DOB/AGE: 04/16/1946 71 y.o.  Admit date: 09/12/2017 Discharge date: 09/16/2017  Admission Diagnoses:  Principal Problem:   Unilateral primary osteoarthritis, left knee Active Problems:   Status post total left knee replacement   Discharge Diagnoses:  Same  Past Medical History:  Diagnosis Date  . Anemia 1992  . Arthritis    oa  . Breast CA ([place]) 1992   lumpectomy with chemo and radiation left breast  . Chronic kidney disease    watching creatining levels sees dr nwbu  . Diabetes mellitus without complication ([place])    type 2   . GERD (gastroesophageal reflux disease)   . Hiatal hernia   . High cholesterol   . Hypertension   . Hypothyroidism   . Personal history of radiation therapy   . Sleep apnea     Surgeries: Procedure(s): LEFT TOTAL KNEE ARTHROPLASTY on 09/12/2017   Consultants:   Discharged Condition: Improved  Hospital Course: Modest Draeger is an 71 y.o. female who was admitted 09/12/2017 for operative treatment ofUnilateral primary osteoarthritis, left knee. Patient has severe unremitting pain that affects sleep, daily activities, and work/hobbies. After pre-op clearance the patient was taken to the operating room on 09/12/2017 and underwent  Procedure(s): LEFT TOTAL KNEE ARTHROPLASTY.    Patient was given perioperative antibiotics: Anti-infectives    Start     Dose/Rate Route Frequency Ordered Stop   09/12/17 1115  vancomycin (VANCOCIN) IVPB 1000 mg/200 mL premix  Status:  Discontinued     1,000 mg 200 mL/hr over 60 Minutes Intravenous Every 12 hours 09/12/17 1109 09/12/17 1149   09/12/17 0600  ceFAZolin (ANCEF) 3 g in dextrose 5 % 50 mL IVPB  Status:  Discontinued     3 g 130 mL/hr over 30 Minutes Intravenous On call to O.R. 09/11/17 1237 09/11/17 1509   09/12/17 0537  ceFAZolin (ANCEF) 2-4 GM/100ML-% IVPB  Status:  Discontinued    Comments:  [place] Session   : cabinet override      09/12/17 0537 09/12/17 0556   09/12/17 0500  vancomycin (VANCOCIN) 1,500 mg in sodium chloride 0.9 % 500 mL IVPB    Comments:  Please adjust to weight based dose   1,500 mg 250 mL/hr over 120 Minutes Intravenous  Once 09/11/17 1510 09/12/17 0844       Patient was given sequential compression devices, early ambulation, and chemoprophylaxis to prevent DVT.  Patient benefited maximally from hospital stay and there were no complications.  She did stay an extra day due to urinary retention and confusion which resolved the day of discharge.  Recent vital signs: Patient Vitals for the past 24 hrs:  BP Temp Temp src Pulse Resp SpO2  09/16/17 0456 (!) 144/58 98.8 F (37.1 C) Oral (!) 106 20 94 %  09/15/17 2138 132/77 98.7 F (37.1 C) Oral (!) 110 20 94 %  09/15/17 1434 132/65 99.5 F (37.5 C) Oral 92 18 92 %     Recent laboratory studies:  Recent Labs  09/15/17 0539 09/15/17 1509  WBC 12.4* 11.5*  HGB 9.0* 8.5*  HCT 26.6* 25.4*  PLT 191 207     Discharge Medications:   Allergies as of 09/16/2017      Reactions   Ibuprofen    Does not take due to kidney function    Levodopa Other (See Comments)   Increase of dopamine levels      Medication List    TAKE these medications   acetaminophen 325 MG tablet Commonly  known as:  TYLENOL Take 650 mg by mouth every 6 (six) hours as needed for headache (pain).   amLODipine 5 MG tablet Commonly known as:  NORVASC Take 5 mg by mouth 2 (two) times daily.   atorvastatin 40 MG tablet Commonly known as:  LIPITOR Take 40 mg by mouth every evening.   CALCIUM 1200+D3 PO Take 1 tablet by mouth daily.   colestipol 1 g tablet Commonly known as:  COLESTID Take 2 g by mouth at bedtime.   Fish Oil 1200 MG Caps Take 1 capsule by mouth at bedtime.   fluticasone 50 MCG/ACT nasal spray Commonly known as:  FLONASE Place 1 spray into both nostrils at bedtime.   gabapentin 300 MG capsule Commonly known as:  NEURONTIN Take 600 mg by mouth at bedtime.   LASIX 20 MG  tablet Generic drug:  furosemide Take 10 mg by mouth daily as needed for edema.   levothyroxine 50 MCG tablet Commonly known as:  SYNTHROID, LEVOTHROID Take 50 mcg by mouth daily before breakfast.   lisinopril 30 MG tablet Commonly known as:  PRINIVIL,ZESTRIL Take 30 mg by mouth 2 (two) times daily.   MAG-200 PO Take 1-2 tablets by mouth See admin instructions. 400 mg in the morning, and 200 mg at bedtime   metFORMIN 500 MG tablet Commonly known as:  GLUCOPHAGE Take 1,000 mg by mouth 2 (two) times daily with a meal.   methocarbamol 500 MG tablet Commonly known as:  ROBAXIN Take 1 tablet (500 mg total) by mouth every 6 (six) hours as needed for muscle spasms.   metoprolol tartrate 50 MG tablet Commonly known as:  LOPRESSOR Take 50 mg by mouth 2 (two) times daily.   MULTIVITAMIN ADULT PO Take 1 tablet by mouth daily.   oxyCODONE-acetaminophen 5-325 MG tablet Commonly known as:  ROXICET Take 1-2 tablets by mouth every 4 (four) hours as needed.   pantoprazole 40 MG tablet Commonly known as:  PROTONIX Take 1 tablet (40 mg total) by mouth 2 (two) times daily. What changed:  when to take this   rivaroxaban 10 MG Tabs tablet Commonly known as:  XARELTO Take 1 tablet (10 mg total) by mouth daily with breakfast.   sitaGLIPtin 25 MG tablet Commonly known as:  JANUVIA Take 25 mg by mouth at bedtime.   zolpidem 10 MG tablet Commonly known as:  AMBIEN Take 10 mg by mouth at bedtime.            Durable Medical Equipment        Start     Ordered   09/12/17 1110  DME 3 n 1  Once     09/12/17 1109   09/12/17 1110  DME Walker rolling  Once    Question:  Patient needs a walker to treat with the following condition  Answer:  Status post total left knee replacement   09/12/17 1109      Diagnostic Studies: Dg Knee Left Port  Result Date: 09/12/2017 CLINICAL DATA:  Status post total knee replacement on the left EXAM: PORTABLE LEFT KNEE - 1-2 VIEW COMPARISON:  None.  FINDINGS: Frontal and lateral views were obtained. The patient is status post total knee replacement with femoral and tibial prosthetic components well-seated. No acute fracture or dislocation. Air in the joint is an expected postoperative finding. IMPRESSION: Prosthetic components well-seated.  No fracture or dislocation. Electronically Signed   By: Lowella Grip III M.D.   On: 09/12/2017 09:59   Mm Screening Breast Tomo Bilateral  Result Date: 08/27/2017 CLINICAL DATA:  Screening. EXAM: 2D DIGITAL SCREENING BILATERAL MAMMOGRAM WITH CAD AND ADJUNCT TOMO COMPARISON:  Previous exam(s). ACR Breast Density Category a: The breast tissue is almost entirely fatty. FINDINGS: There are no findings suspicious for malignancy. Stable postsurgical changes on the left. Images were processed with CAD. IMPRESSION: No mammographic evidence of malignancy. A result letter of this screening mammogram will be mailed directly to the patient. RECOMMENDATION: Screening mammogram in one year. (Code:SM-B-01Y) BI-RADS CATEGORY  2: Benign. Electronically Signed   By: Lajean Manes M.D.   On: 08/27/2017 15:07    Disposition: to skilled nursing facility  Discharge Instructions    Discharge patient    Complete by:  As directed    Discharge disposition:  03-Skilled [facility]   Discharge patient date:  09/15/2017   Discharge patient    Complete by:  As directed    Discharge disposition:  03-Skilled [facility]   Discharge patient date:  09/16/2017       Contact information for follow-up providers    Mcarthur Rossetti, MD Follow up in 2 week(s).   Specialty:  Orthopedic Surgery Contact information: [place] [address] [phone]        [facility] Follow up.   Specialty:  Home Health Services Why:  Home Health Physical Therapy- will call to arrange initial visit Contact information: [place] [address] [phone]             Contact information for after-discharge care    Destination    [facility] .   Specialty:  [place] information: [place] [phone]                   Signed: Mcarthur Rossetti 09/16/2017, 7:15 AM

## 2017-09-16 NOTE — Progress Notes (Signed)
Occupational Therapy Treatment Patient Details Name: Kristin [name] MRN: [id number] DOB: 30-Dec-1945 Today's Date: 09/16/2017    History of present illness Pt is a 71 year old female s/p L TKA with PMHx significant for breast cancer, DM, and obesity, sleep apnea   OT comments  Goals set this session.  Performed SPT from commode back to bed and ADL from commode seat.  Cues for pursed lip breathing as work of breathing increased with activity  Follow Up Recommendations  SNF    Equipment Recommendations  3 in 1 bedside commode    Recommendations for Other Services      Precautions / Restrictions Precautions Precautions: Knee;Fall Required Braces or Orthoses: Knee Immobilizer - Left Knee Immobilizer - Left: Discontinue once straight leg raise with < 10 degree lag Restrictions Other Position/Activity Restrictions: WBAT       Mobility Bed Mobility           Sit to supine: Min assist   General bed mobility comments: assist for LLE and to reposition in bed  Transfers                      Balance             Standing balance-Leahy Scale: Poor Standing balance comment: loss of balance in standing                           ADL either performed or assessed with clinical judgement   ADL           Upper Body Bathing: Set up;Sitting   Lower Body Bathing: Moderate assistance;Sit to/from stand   Upper Body Dressing : Set up;Sitting       Toilet Transfer: Minimal assistance;BSC;Stand-pivot   Toileting- Clothing Manipulation and Hygiene: Minimal assistance;Sit to/from stand         General ADL Comments: performed adl from 3:1 commode and assisted back to bed. NT had helped pt up to commode.  Pt fatiques easily. Instructed on pursed lip breathing. Decreased standing balance.  Assisted her with loss of balance once and to sit for rest breaks twice     Vision       Perception     Praxis      Cognition Arousal/Alertness:  Awake/alert Behavior During Therapy: WFL for tasks assessed/performed                             Safety/Judgement: Decreased awareness of deficits     General Comments: followed one step commands        Exercises     Shoulder Instructions       General Comments      Pertinent Vitals/ Pain       Pain Score: 4  Pain Location: L knee/ thigh Pain Descriptors / Indicators: Aching;Sore Pain Intervention(s): Limited activity within patient's tolerance;Monitored during session;Premedicated before session;Repositioned  Home Living                                          Prior Functioning/Environment              Frequency           Progress Toward Goals  OT Goals(current goals can now be found in the care plan section)  Progress towards OT goals:  (  goals set today)  ADL Goals Pt Will Perform Lower Body Bathing: with min assist;sit to/from stand Pt Will Transfer to Toilet: with min guard assist;bedside commode;stand pivot transfer Pt Will Perform Toileting - Clothing Manipulation and hygiene: with min guard assist;sit to/from stand  Plan Discharge plan needs to be updated    Co-evaluation                 AM-PAC PT "6 Clicks" Daily Activity     Outcome Measure   Help from another person eating meals?: None Help from another person taking care of personal grooming?: A Little Help from another person toileting, which includes using toliet, bedpan, or urinal?: A Little Help from another person bathing (including washing, rinsing, drying)?: A Lot Help from another person to put on and taking off regular upper body clothing?: A Little Help from another person to put on and taking off regular lower body clothing?: A Lot 6 Click Score: 17    End of Session    OT Visit Diagnosis: Unsteadiness on feet (R26.81);Muscle weakness (generalized) (M62.81);Pain Pain - Right/Left: Left Pain - part of body: Knee   Activity Tolerance  Patient limited by fatigue   Patient Left in bed;with call bell/phone within reach;with bed alarm set   Nurse Communication          Time: 8887-5797 OT Time Calculation (min): 23 min  Charges: OT General Charges $OT Visit: 1 Visit OT Treatments $Self Care/Home Management : 23-37 mins  Lesle Chris, OTR/L [phone] 09/16/2017   [name] 09/16/2017, 10:06 AM

## 2017-09-16 NOTE — Progress Notes (Signed)
Report called to [facility], Patient transported per Gretchen Portela

## 2017-09-16 NOTE — Progress Notes (Signed)
Patient and Family are agreeable for the patient to transport by PTAR. CSW confirmed facility is ready, scheduled PTAR pick up.  Nurse provided number for report, transport packet.   Kathrin Greathouse, Latanya Presser, MSW Clinical Social Worker 5E and Psychiatric Service Line [phone] 09/16/2017  9:54 AM

## 2017-09-16 NOTE — Progress Notes (Signed)
Patient ID: Kristin [name], female   DOB: 02/02/1946, 71 y.o.   MRN: [id number] Vitals stable this am and better mental status in terms of no confusion this morning.  Left knee stable.  Can be discharged to skilled nursing today.

## 2017-09-17 ENCOUNTER — Encounter (HOSPITAL_COMMUNITY): Payer: Self-pay | Admitting: Emergency Medicine

## 2017-09-17 ENCOUNTER — Emergency Department (HOSPITAL_COMMUNITY)
Admission: EM | Admit: 2017-09-17 | Discharge: 2017-09-18 | Disposition: A | Discharge status: Home / Self Care | Payer: Medicare Other | Attending: Emergency Medicine | Admitting: Emergency Medicine

## 2017-09-17 DIAGNOSIS — Z7984 Long term (current) use of oral hypoglycemic drugs: Secondary | ICD-10-CM | POA: Diagnosis not present

## 2017-09-17 DIAGNOSIS — N179 Acute kidney failure, unspecified: Secondary | ICD-10-CM | POA: Diagnosis not present

## 2017-09-17 DIAGNOSIS — E860 Dehydration: Secondary | ICD-10-CM | POA: Diagnosis not present

## 2017-09-17 DIAGNOSIS — N3000 Acute cystitis without hematuria: Secondary | ICD-10-CM | POA: Diagnosis not present

## 2017-09-17 DIAGNOSIS — Z96652 Presence of left artificial knee joint: Secondary | ICD-10-CM | POA: Diagnosis not present

## 2017-09-17 DIAGNOSIS — E119 Type 2 diabetes mellitus without complications: Secondary | ICD-10-CM | POA: Diagnosis not present

## 2017-09-17 DIAGNOSIS — I129 Hypertensive chronic kidney disease with stage 1 through stage 4 chronic kidney disease, or unspecified chronic kidney disease: Secondary | ICD-10-CM | POA: Diagnosis not present

## 2017-09-17 DIAGNOSIS — E039 Hypothyroidism, unspecified: Secondary | ICD-10-CM | POA: Diagnosis not present

## 2017-09-17 DIAGNOSIS — R410 Disorientation, unspecified: Secondary | ICD-10-CM | POA: Diagnosis present

## 2017-09-17 DIAGNOSIS — Z79899 Other long term (current) drug therapy: Secondary | ICD-10-CM | POA: Insufficient documentation

## 2017-09-17 DIAGNOSIS — N189 Chronic kidney disease, unspecified: Secondary | ICD-10-CM | POA: Diagnosis not present

## 2017-09-17 LAB — CBC WITH DIFFERENTIAL/PLATELET
BASOS ABS: 0 10*3/uL (ref 0.0–0.1)
Basophils Relative: 0 %
EOS PCT: 2 %
Eosinophils Absolute: 0.2 10*3/uL (ref 0.0–0.7)
HEMATOCRIT: 27.3 % — AB (ref 36.0–46.0)
Hemoglobin: 9.1 g/dL — ABNORMAL LOW (ref 12.0–15.0)
LYMPHS ABS: 1.6 10*3/uL (ref 0.7–4.0)
LYMPHS PCT: 14 %
MCH: 31.4 pg (ref 26.0–34.0)
MCHC: 33.3 g/dL (ref 30.0–36.0)
MCV: 94.1 fL (ref 78.0–100.0)
MONO ABS: 0.8 10*3/uL (ref 0.1–1.0)
Monocytes Relative: 7 %
NEUTROS ABS: 8.5 10*3/uL — AB (ref 1.7–7.7)
Neutrophils Relative %: 77 %
Platelets: 275 10*3/uL (ref 150–400)
RBC: 2.9 MIL/uL — AB (ref 3.87–5.11)
RDW: 13.7 % (ref 11.5–15.5)
WBC: 11.1 10*3/uL — AB (ref 4.0–10.5)

## 2017-09-17 LAB — URINALYSIS, ROUTINE W REFLEX MICROSCOPIC
BILIRUBIN URINE: NEGATIVE
GLUCOSE, UA: NEGATIVE mg/dL
KETONES UR: NEGATIVE mg/dL
Nitrite: NEGATIVE
PROTEIN: NEGATIVE mg/dL
Specific Gravity, Urine: 1.014 (ref 1.005–1.030)
pH: 5 (ref 5.0–8.0)

## 2017-09-17 LAB — BASIC METABOLIC PANEL
ANION GAP: 14 (ref 5–15)
BUN: 52 mg/dL — AB (ref 6–20)
CHLORIDE: 98 mmol/L — AB (ref 101–111)
CO2: 17 mmol/L — AB (ref 22–32)
Calcium: 8.9 mg/dL (ref 8.9–10.3)
Creatinine, Ser: 2.21 mg/dL — ABNORMAL HIGH (ref 0.44–1.00)
GFR calc Af Amer: 24 mL/min — ABNORMAL LOW (ref >60)
GFR calc non Af Amer: 21 mL/min — ABNORMAL LOW (ref >60)
GLUCOSE: 158 mg/dL — AB (ref 65–99)
POTASSIUM: 4.5 mmol/L (ref 3.5–5.1)
Sodium: 129 mmol/L — ABNORMAL LOW (ref 135–145)

## 2017-09-17 NOTE — ED Triage Notes (Signed)
Pt with [facility] for fall and abnormal labs. Pt is from Rehab for Left Knee Replacement. Pt reports fall out of bed but unsure how she fell. Creatinine 2.3 Hgb 7.7 pt remains alert and oriented. Placed on 2L for sats of 92% ra. Pt on post-op blood thinners.

## 2017-09-17 NOTE — ED Notes (Signed)
Pt had drawn for labs:  Gold Blue Lavender Lt green Dark green x2 

## 2017-09-18 ENCOUNTER — Emergency Department (HOSPITAL_COMMUNITY): Payer: Medicare Other

## 2017-09-18 DIAGNOSIS — N179 Acute kidney failure, unspecified: Secondary | ICD-10-CM | POA: Diagnosis not present

## 2017-09-18 LAB — BASIC METABOLIC PANEL
ANION GAP: 10 (ref 5–15)
BUN: 53 mg/dL — ABNORMAL HIGH (ref 6–20)
CALCIUM: 8.2 mg/dL — AB (ref 8.9–10.3)
CHLORIDE: 103 mmol/L (ref 101–111)
CO2: 18 mmol/L — AB (ref 22–32)
Creatinine, Ser: 1.99 mg/dL — ABNORMAL HIGH (ref 0.44–1.00)
GFR calc non Af Amer: 24 mL/min — ABNORMAL LOW (ref >60)
GFR, EST AFRICAN AMERICAN: 28 mL/min — AB (ref >60)
Glucose, Bld: 158 mg/dL — ABNORMAL HIGH (ref 65–99)
POTASSIUM: 4.4 mmol/L (ref 3.5–5.1)
Sodium: 131 mmol/L — ABNORMAL LOW (ref 135–145)

## 2017-09-18 LAB — POC OCCULT BLOOD, ED: Fecal Occult Bld: NEGATIVE

## 2017-09-18 MED ORDER — CEPHALEXIN 500 MG PO CAPS: 500.0000 mg | ORAL_CAPSULE | Freq: Two times a day (BID) | ORAL | 0 refills | Status: DC

## 2017-09-18 MED ORDER — SODIUM CHLORIDE 0.9 % IV BOLUS (SEPSIS)
1000.0000 mL | Freq: Once | INTRAVENOUS | Status: AC
  Administered 2017-09-18: 1000 mL via INTRAVENOUS

## 2017-09-18 MED ORDER — DEXTROSE 5 % IV SOLN
1.0000 g | Freq: Once | INTRAVENOUS | Status: AC
  Administered 2017-09-18: 1 g via INTRAVENOUS
  Filled 2017-09-18: qty 10

## 2017-09-18 NOTE — ED Provider Notes (Signed)
[facility] DEPT Provider Note   CSN: [id number] Arrival date & time: 09/17/17  1602     History   Chief Complaint Chief Complaint  Patient presents with  . Fall  . Abnormal Lab    HPI Kristin [name] is a 71 y.o. female.  Patient sent to the emergency department from rehabilitation for evaluation of abnormal lab work. Patient underwent left total knee replacement 5 days ago. She was discharged from the hospital to rehabilitation 2 days ago. Patient reports that she had been feeling like the pain medicine had caused her to be very confused. She reports that she was found on the floor last night/early this morning. She's not sure if she was trying to get out of bed or fell out of bed. Her orthopedic surgeon was concerned about the knee, recommended she get an x-ray. She had routine blood work performed that showed elevated creatinine and low hemoglobin, was sent to the ER for further evaluation. She has not had any active bleeding. Denies melanotic stools, rectal bleeding. She is not experiencing any increased pain in the knee. She is on anticoagulation.      Past Medical History:  Diagnosis Date  . Anemia 1992  . Arthritis    oa  . Breast CA ([place]) 1992   lumpectomy with chemo and radiation left breast  . Chronic kidney disease    watching creatining levels sees dr nwbu  . Diabetes mellitus without complication ([place])    type 2   . GERD (gastroesophageal reflux disease)   . Hiatal hernia   . High cholesterol   . Hypertension   . Hypothyroidism   . Personal history of radiation therapy   . Sleep apnea     Patient Active Problem List   Diagnosis Date Noted  . 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 Closed nondisplaced fracture of head of right radius with routine healing 07/10/2017  . Chronic pain of left knee 07/10/2017  . Acute pain of left knee 07/10/2017  . Unilateral primary osteoarthritis, left knee 07/10/2017  . Closed nondisplaced fracture of  head of right radius 06/09/2017    Past Surgical History:  Procedure Laterality Date  . BACK SURGERY     upper and lower upper back has clamp  . BREAST LUMPECTOMY Left   . CHOLECYSTECTOMY    . TONSILLECTOMY    . TOTAL KNEE ARTHROPLASTY Left 09/12/2017   Procedure: LEFT TOTAL KNEE ARTHROPLASTY;  Surgeon: Mcarthur Rossetti, MD;  Location: [facility];  Service: Orthopedics;  Laterality: Left;  . TUBAL LIGATION      OB History    No data available       Home Medications    Prior to Admission medications   Medication Sig Start Date End Date Taking? Authorizing Provider  acetaminophen (TYLENOL) 325 MG tablet Take 650 mg by mouth every 6 (six) hours as needed for headache (pain).   Yes [provider]  amLODipine (NORVASC) 5 MG tablet Take 5 mg by mouth 2 (two) times daily.    Yes [provider]  atorvastatin (LIPITOR) 40 MG tablet Take 40 mg by mouth every evening.    Yes [provider]  Calcium-Magnesium-Vitamin D (CALCIUM 1200+D3 PO) Take 1 tablet by mouth daily.   Yes [provider]  colestipol (COLESTID) 1 g tablet Take 2 g by mouth at bedtime.   Yes [provider]  fluticasone (FLONASE) 50 MCG/ACT nasal spray Place 1 spray into both nostrils at bedtime.  Yes [provider]  furosemide (LASIX) 20 MG tablet Take 10 mg by mouth daily as needed for edema.   Yes [provider]  gabapentin (NEURONTIN) 600 MG tablet Take 600 mg by mouth at bedtime.   Yes [provider]  levothyroxine (SYNTHROID, LEVOTHROID) 50 MCG tablet Take 50 mcg by mouth daily before breakfast.  01/30/17 01/30/18 Yes [provider]  lisinopril (PRINIVIL,ZESTRIL) 30 MG tablet Take 30 mg by mouth 2 (two) times daily.   Yes [provider]  Magnesium Oxide (MAG-200 PO) Take 1-2 tablets by mouth See admin instructions. 400 mg in the morning, and 200 mg at bedtime   Yes [provider]  metFORMIN (GLUCOPHAGE) 500 MG  tablet Take 1,000 mg by mouth 2 (two) times daily with a meal.   Yes [provider]  methocarbamol (ROBAXIN) 500 MG tablet Take 1 tablet (500 mg total) by mouth every 6 (six) hours as needed for muscle spasms. 09/13/17  Yes Mcarthur Rossetti, MD  metoprolol (LOPRESSOR) 50 MG tablet Take 50 mg by mouth 2 (two) times daily.   Yes [provider]  Multiple Vitamins-Minerals (MULTIVITAMIN ADULT PO) Take 1 tablet by mouth daily.    Yes [provider]  Omega-3 Fatty Acids (FISH OIL) 1200 MG CAPS Take 1,200 mg by mouth at bedtime.    Yes [provider]  oxyCODONE-acetaminophen (ROXICET) 5-325 MG tablet Take 1-2 tablets by mouth every 4 (four) hours as needed. 09/13/17  Yes Mcarthur Rossetti, MD  pantoprazole (PROTONIX) 40 MG tablet Take 1 tablet (40 mg total) by mouth 2 (two) times daily. 07/03/16  Yes Charlesetta Shanks, MD  rivaroxaban (XARELTO) 10 MG TABS tablet Take 1 tablet (10 mg total) by mouth daily with breakfast. 09/14/17  Yes Mcarthur Rossetti, MD  sitaGLIPtin (JANUVIA) 25 MG tablet Take 25 mg by mouth at bedtime.    Yes [provider]  cephALEXin (KEFLEX) 500 MG capsule Take 1 capsule (500 mg total) by mouth 2 (two) times daily. 09/18/17   Orpah Greek, MD    Family History Family History  Problem Relation Age of Onset  . Breast cancer Mother 85    Social History Social History  Substance Use Topics  . Smoking status: Never Smoker  . Smokeless tobacco: Never Used  . Alcohol use Yes     Comment: rare     Allergies   Ibuprofen; Levodopa; and Oxycodone   Review of Systems Review of Systems  Musculoskeletal: Positive for arthralgias.  Neurological: Positive for weakness.  All other systems reviewed and are negative.    Physical Exam Updated Vital Signs BP 135/68 (BP Location: Left Arm)   Pulse (!) 102   Temp 98.1 F (36.7 C) (Oral)   Resp 20   SpO2 98%   Physical Exam  Constitutional: She is  oriented to person, place, and time. She appears well-developed and well-nourished. No distress.  HENT:  Head: Normocephalic and atraumatic.  Right Ear: Hearing normal.  Left Ear: Hearing normal.  Nose: Nose normal.  Mouth/Throat: Oropharynx is clear and moist and mucous membranes are normal.  Eyes: Pupils are equal, round, and reactive to light. Conjunctivae and EOM are normal.  Neck: Normal range of motion. Neck supple.  Cardiovascular: Regular rhythm, S1 normal and S2 normal.  Exam reveals no gallop and no friction rub.   No murmur heard. Pulmonary/Chest: Effort normal and breath sounds normal. No respiratory distress. She exhibits no tenderness.  Abdominal: Soft. Normal appearance and bowel  sounds are normal. There is no hepatosplenomegaly. There is no tenderness. There is no rebound, no guarding, no tenderness at McBurney's point and negative Murphy's sign. No hernia.  Musculoskeletal: Normal range of motion.       Left knee: She exhibits swelling.  Neurological: She is alert and oriented to person, place, and time. She has normal strength. No cranial nerve deficit or sensory deficit. Coordination normal. GCS eye subscore is 4. GCS verbal subscore is 5. GCS motor subscore is 6.  Skin: Skin is warm, dry and intact. No rash noted. No cyanosis.  Psychiatric: She has a normal mood and affect. Her speech is normal and behavior is normal. Thought content normal.  Nursing note and vitals reviewed.    ED Treatments / Results  Labs (all labs ordered are listed, but only abnormal results are displayed) Labs Reviewed  CBC WITH DIFFERENTIAL/PLATELET - Abnormal; Notable for the following:       Result Value   WBC 11.1 (*)    RBC 2.90 (*)    Hemoglobin 9.1 (*)    HCT 27.3 (*)    Neutro Abs 8.5 (*)    All other components within normal limits  BASIC METABOLIC PANEL - Abnormal; Notable for the following:    Sodium 129 (*)    Chloride 98 (*)    CO2 17 (*)    Glucose, Bld 158 (*)    BUN 52  (*)    Creatinine, Ser 2.21 (*)    GFR calc non Af Amer 21 (*)    GFR calc Af Amer 24 (*)    All other components within normal limits  URINALYSIS, ROUTINE W REFLEX MICROSCOPIC - Abnormal; Notable for the following:    APPearance CLOUDY (*)    Hgb urine dipstick SMALL (*)    Leukocytes, UA LARGE (*)    Bacteria, UA FEW (*)    Squamous Epithelial / LPF 0-5 (*)    Non Squamous Epithelial 0-5 (*)    All other components within normal limits  BASIC METABOLIC PANEL - Abnormal; Notable for the following:    Sodium 131 (*)    CO2 18 (*)    Glucose, Bld 158 (*)    BUN 53 (*)    Creatinine, Ser 1.99 (*)    Calcium 8.2 (*)    GFR calc non Af Amer 24 (*)    GFR calc Af Amer 28 (*)    All other components within normal limits  POC OCCULT BLOOD, ED    EKG  EKG Interpretation None       Radiology Dg Knee Complete 4 Views Left  Result Date: 09/18/2017 CLINICAL DATA:  [place] out of bed this evening, now with increased pain. Arthroplasty was performed last week. EXAM: LEFT KNEE - COMPLETE 4+ VIEW COMPARISON:  09/12/2017 FINDINGS: The total left knee arthroplasty hardware appears intact, with no change in position or alignment. No fracture. No acute soft tissue abnormality. IMPRESSION: Negative. Electronically Signed   By: Andreas Newport M.D.   On: 09/18/2017 00:38    Procedures Procedures (including critical care time)  Medications Ordered in ED Medications  sodium chloride 0.9 % bolus 1,000 mL (0 mLs Intravenous Stopped 09/18/17 0250)  cefTRIAXone (ROCEPHIN) 1 g in dextrose 5 % 50 mL IVPB (1 g Intravenous New Bag/Given 09/18/17 0250)     Initial Impression / Assessment and Plan / ED Course  I have reviewed the triage vital signs and the nursing notes.  Pertinent labs & imaging results that were  available during my care of the patient were reviewed by me and considered in my medical decision making (see chart for details).     Patient presents predominantly for evaluation of  abnormal lab work performed and rehabilitation, but also to get an x-ray because she had a fall yesterday. Patient underwent left total knee replacement. X-ray is negative. Examination of the leg does not reveal any concerns for postoperative complications.  Patient found to have an elevated BUN and creatinine. She does have baseline renal insufficiency but creatinine is far above her normal level. Sodium was slightly low as well. Patient understood IV fluids and recheck of her labs showed significant improvement. The anemia that was seen at outside lab was not reproduced here. Hemoglobin is 9.1 today, was 8.5 at time of discharge from the hospital. Rectal exam was negative for occult blood. Urinalysis does show signs of infection. She will continue treatment for urinary tract infection, but at this point does not require repeat hospitalization, is appropriate for return to rehabilitation.  Final Clinical Impressions(s) / ED Diagnoses   Final diagnoses:  AKI (acute kidney injury) ([place])  Dehydration  Acute cystitis without hematuria    New Prescriptions New Prescriptions   CEPHALEXIN (KEFLEX) 500 MG CAPSULE    Take 1 capsule (500 mg total) by mouth 2 (two) times daily.     Orpah Greek, MD 09/18/17 [phone]

## 2017-09-18 NOTE — Discharge Instructions (Signed)
Patient has been successfully treated in the emergency department and is appropriate for return to rehabilitation at this time. She does not require hospitalization.

## 2017-09-29 ENCOUNTER — Ambulatory Visit (INDEPENDENT_AMBULATORY_CARE_PROVIDER_SITE_OTHER): Payer: Medicare Other | Admitting: Orthopaedic Surgery

## 2017-09-29 DIAGNOSIS — Z96652 Presence of left artificial knee joint: Secondary | ICD-10-CM

## 2017-09-29 MED ORDER — HYDROCODONE-ACETAMINOPHEN 5-325 MG PO TABS: 1.0000 | ORAL_TABLET | Freq: Three times a day (TID) | ORAL | 0 refills | Status: DC | PRN

## 2017-09-29 NOTE — Progress Notes (Signed)
The patient is now 17 days status post a left total knee arthroplasty. She is doing excellent from her standpoint. I agree with this as well. She reports that she's been able to flex her knee to 100. She's been on blood thinning medications as well. She's been transition from walker to a cane. She like to go ahead drive as well.  On examination her calf is soft. Her left knee incisions is well-healed and the staples have been removed and Steri-Strips applied. She has excellent range of motion of that knee thus far.  At this point we'll see her back in 4 weeks to see how she is doing overall. I did give her a prescription for hydrocodone. I've cleared him to drive when she is not taking pain medication.

## 2017-10-27 ENCOUNTER — Ambulatory Visit (INDEPENDENT_AMBULATORY_CARE_PROVIDER_SITE_OTHER): Payer: Medicare Other | Admitting: Orthopaedic Surgery

## 2017-10-27 DIAGNOSIS — Z96652 Presence of left artificial knee joint: Secondary | ICD-10-CM

## 2017-10-27 MED ORDER — HYDROCODONE-ACETAMINOPHEN 5-325 MG PO TABS: 1.0000 | ORAL_TABLET | Freq: Three times a day (TID) | ORAL | 0 refills | Status: DC | PRN

## 2017-10-27 NOTE — Progress Notes (Signed)
The patient is now between 6 and 7 weeks status post a left total knee arthroplasty.  She is doing well overall.  She is no longer doing physical therapy 3 days a week.  She still has some pain at night and pain with some activities but does feel that her range of motion and strength are improving as well as her balance.  She does still use a cane.  On examination of her left knee her incisions well-healed.  She does have some swelling but then he feels like mostly stable.  Her extension is full in flexion is to well past 100 degrees.  At this point should continue to increase her activities.  I will see her back for formal visit in 4 weeks with no x-rays are needed.  I did refill her pain medication.  If she looks good in 4 weeks we probably do not need to see her back for new x-rays for 6 months after that.

## 2017-11-24 ENCOUNTER — Encounter (INDEPENDENT_AMBULATORY_CARE_PROVIDER_SITE_OTHER): Payer: Self-pay | Admitting: Orthopaedic Surgery

## 2017-11-24 ENCOUNTER — Ambulatory Visit (INDEPENDENT_AMBULATORY_CARE_PROVIDER_SITE_OTHER): Payer: Medicare Other | Admitting: Orthopaedic Surgery

## 2017-11-24 DIAGNOSIS — Z96652 Presence of left artificial knee joint: Secondary | ICD-10-CM

## 2017-11-24 NOTE — Progress Notes (Signed)
The patient is now just under 3 months status post a left total knee arthroplasty and says she is doing wonderful.  She says getting up and getting down a sore and uncomfortable but overall her motion and strength have improved dramatically.  On exam she has excellent range of motion of her left knee which is almost full and her knee is grossly stable.  Incisions well-healed.  At this point she will continue to increase her activities.  I will see her back in 3 months and that is when I would like an AP and lateral of her left knee.  All questions concerns were answered and addressed.

## 2017-12-17 ENCOUNTER — Telehealth (INDEPENDENT_AMBULATORY_CARE_PROVIDER_SITE_OTHER): Payer: Self-pay

## 2017-12-17 NOTE — Telephone Encounter (Signed)
Yes can you work her in tomorrow afternoon (whenever) and let her know we are completely booked and she WILL be waiting

## 2017-12-17 NOTE — Telephone Encounter (Signed)
Patient would like to be worked in for her right knee.  Stated that she can barely stand.  Cb# is [phone].  Please advise.

## 2017-12-17 NOTE — Telephone Encounter (Signed)
Talked with patient and advised her of message per Renato Gails.  Patient has appt.scheduled for 12/18/17.

## 2017-12-18 ENCOUNTER — Encounter (INDEPENDENT_AMBULATORY_CARE_PROVIDER_SITE_OTHER): Payer: Self-pay | Admitting: Orthopaedic Surgery

## 2017-12-18 ENCOUNTER — Ambulatory Visit (INDEPENDENT_AMBULATORY_CARE_PROVIDER_SITE_OTHER): Payer: Medicare Other | Admitting: Orthopaedic Surgery

## 2017-12-18 DIAGNOSIS — M25561 Pain in right knee: Secondary | ICD-10-CM | POA: Diagnosis not present

## 2017-12-18 MED ORDER — METHYLPREDNISOLONE ACETATE 40 MG/ML IJ SUSP
40.0000 mg | INTRAMUSCULAR | Status: AC | PRN
  Administered 2017-12-18: 40 mg via INTRA_ARTICULAR

## 2017-12-18 MED ORDER — LIDOCAINE HCL 1 % IJ SOLN
3.0000 mL | INTRAMUSCULAR | Status: AC | PRN
  Administered 2017-12-18: 3 mL

## 2017-12-18 NOTE — Progress Notes (Signed)
Office Visit Note   Patient: Kristin [name]           Date of Birth: 04-04-1946           MRN: [id number] Visit Date: 12/18/2017              Requested by: Javier Glazier, MD No address on file PCP: Javier Glazier, MD   Assessment & Plan: Visit Diagnoses:  1. Acute pain of right knee     Plan: X-rays from last year of her right knee does show moderate tricompartmental arthritic changes with medial joint space narrowing and para-articular osteophytes.  She has had arthroscopic intervention but he will for sure likely she may have worsening medial compartment arthritic changes on top of an acute meniscal injury.  I want to try steroid injection today and she is agreeable to this.  She tolerated steroid injection well in the right knee.  She will follow-up in 2 weeks to see how she is doing overall.  She still having the mechanical symptoms of pain with pivoting activities and a positive McMurray's with locking catching we would obtain an MRI at that point if needed.  Follow-Up Instructions: Return in about 2 weeks (around 01/01/2018).   Orders:  Orders Placed This Encounter  Procedures  . Large Joint Inj   No orders of the defined types were placed in this encounter.     Procedures: Large Joint Inj: R knee on 12/18/2017 4:08 PM Indications: diagnostic evaluation and pain Details: 22 G 1.5 in needle, superolateral approach  Arthrogram: No  Medications: 3 mL lidocaine 1 %; 40 mg methylPREDNISolone acetate 40 MG/ML Outcome: tolerated well, no immediate complications Procedure, treatment alternatives, risks and benefits explained, specific risks discussed. Consent was given by the patient. Immediately prior to procedure a time out was called to verify the correct patient, procedure, equipment, support staff and site/side marked as required. Patient was prepped and draped in the usual sterile fashion.       Clinical Data: No additional findings.   Subjective: Chief  Complaint  Patient presents with  . Right Knee - Follow-up  Patient comes in today with acute right knee pain.  She had a twisting injury to this knee a few days ago and a pop in the knee swelled up significantly.  She is in postop recovery about 4 months out from a total knee arthroplasty on the right side which she did well with.  She has a remote history of a right knee arthroscopy years ago for meniscal injury.  She does have medial joint line tenderness she states it was quite swollen for days.  She has had difficulty pivoting and ambulating on the right knee.  She had been doing well until this twisting injury.  HPI  Review of Systems She currently denies any headache, shortness of breath, fever, chills, nausea, vomiting  Objective: Vital Signs: There were no vitals taken for this visit.  Physical Exam She is alert and oriented x3 and in no acute distress Ortho Exam Examination of her right knee shows medial joint line tenderness and positive Murray sign of the medial side of the knee.  Range of motion is full but painful. Specialty Comments:  No specialty comments available.  Imaging: No results found.   PMFS History: Patient Active Problem List   Diagnosis Date Noted  . 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 Closed nondisplaced fracture of head of right radius with routine healing 07/10/2017  . Chronic  pain of left knee 07/10/2017  . Acute pain of left knee 07/10/2017  . Unilateral primary osteoarthritis, left knee 07/10/2017  . Closed nondisplaced fracture of head of right radius 06/09/2017   Past Medical History:  Diagnosis Date  . Anemia 1992  . Arthritis    oa  . Breast CA ([place]) 1992   lumpectomy with chemo and radiation left breast  . Chronic kidney disease    watching creatining levels sees dr nwbu  . Diabetes mellitus without complication ([place])    type 2   . GERD (gastroesophageal reflux disease)   . Hiatal hernia   . High cholesterol   .  Hypertension   . Hypothyroidism   . Personal history of radiation therapy   . Sleep apnea     Family History  Problem Relation Age of Onset  . Breast cancer Mother 71    Past Surgical History:  Procedure Laterality Date  . BACK SURGERY     upper and lower upper back has clamp  . BREAST LUMPECTOMY Left   . CHOLECYSTECTOMY    . TONSILLECTOMY    . TOTAL KNEE ARTHROPLASTY Left 09/12/2017   Procedure: LEFT TOTAL KNEE ARTHROPLASTY;  Surgeon: Mcarthur Rossetti, MD;  Location: [facility];  Service: Orthopedics;  Laterality: Left;  . TUBAL LIGATION     Social History   Occupational History  . Not on file  Tobacco Use  . Smoking status: Never Smoker  . Smokeless tobacco: Never Used  Substance and Sexual Activity  . Alcohol use: Yes    Comment: rare  . Drug use: No  . Sexual activity: Not on file

## 2018-01-01 ENCOUNTER — Encounter (INDEPENDENT_AMBULATORY_CARE_PROVIDER_SITE_OTHER): Payer: Self-pay | Admitting: Orthopaedic Surgery

## 2018-01-01 ENCOUNTER — Ambulatory Visit (INDEPENDENT_AMBULATORY_CARE_PROVIDER_SITE_OTHER): Payer: Medicare Other | Admitting: Orthopaedic Surgery

## 2018-01-01 DIAGNOSIS — Z96652 Presence of left artificial knee joint: Secondary | ICD-10-CM

## 2018-01-01 NOTE — Progress Notes (Signed)
The patient is now 3-1/2 months status post a left total knee arthroplasty.  We also injected her right knee about 2 weeks ago with steroid due to severe pain in her right knee.  She said it took about a week and on the right knee is done much better overall.  She said her left knee is doing well except for some stiffness that she is been sitting for a period of time.  She is walking without assistive device.  She gets up easily from a chair.  Her left knee has full range of motion and it feels stable ligamentously.  At this point we do not want to see her back for 8 months.  We will have an AP and lateral of her if she is to be seen before that she will let us know.  Left operative knee at that standpoint.  Obviously apparently she is traveling to [place] in July we can always inject her right knee before the trip only if she needs it.  All questions concerns were answered and addressed.

## 2018-02-19 ENCOUNTER — Ambulatory Visit (INDEPENDENT_AMBULATORY_CARE_PROVIDER_SITE_OTHER): Payer: Medicare Other | Admitting: Orthopaedic Surgery

## 2018-03-09 ENCOUNTER — Encounter (INDEPENDENT_AMBULATORY_CARE_PROVIDER_SITE_OTHER): Payer: Self-pay | Admitting: Orthopaedic Surgery

## 2018-03-09 ENCOUNTER — Ambulatory Visit (INDEPENDENT_AMBULATORY_CARE_PROVIDER_SITE_OTHER): Payer: Medicare Other | Admitting: Orthopaedic Surgery

## 2018-03-09 DIAGNOSIS — Z96652 Presence of left artificial knee joint: Secondary | ICD-10-CM | POA: Diagnosis not present

## 2018-03-09 NOTE — Progress Notes (Signed)
Kristin [name] is now 6 months status post a left total knee arthroplasty.  She is doing well overall and has no complaints.  On exam she has full range of motion of her left knee.  There is no swelling or effusion.  The knee is grossly stable.  We did not need x-ray today.  We will see her back in 6 months from now which would be the one-year standpoint.  We will have an AP and lateral of her left knee at that visit.  All questions and concerns were answered and addressed.

## 2018-05-11 ENCOUNTER — Ambulatory Visit (INDEPENDENT_AMBULATORY_CARE_PROVIDER_SITE_OTHER): Payer: Medicare Other | Admitting: Orthopaedic Surgery

## 2018-07-08 ENCOUNTER — Ambulatory Visit (INDEPENDENT_AMBULATORY_CARE_PROVIDER_SITE_OTHER): Payer: Medicare Other | Admitting: Orthopaedic Surgery

## 2018-07-15 ENCOUNTER — Other Ambulatory Visit: Payer: Self-pay | Admitting: Internal Medicine

## 2018-07-15 DIAGNOSIS — Z1231 Encounter for screening mammogram for malignant neoplasm of breast: Secondary | ICD-10-CM

## 2018-08-15 IMAGING — DX DG KNEE 1-2V PORT*L*
2 series · 2 of 2 positions shown · non-contrast
Comparison: None.

CLINICAL DATA: Status post total knee replacement on the left

EXAM:
PORTABLE LEFT KNEE - 1-2 VIEW

[knee lat (1 of 2)]
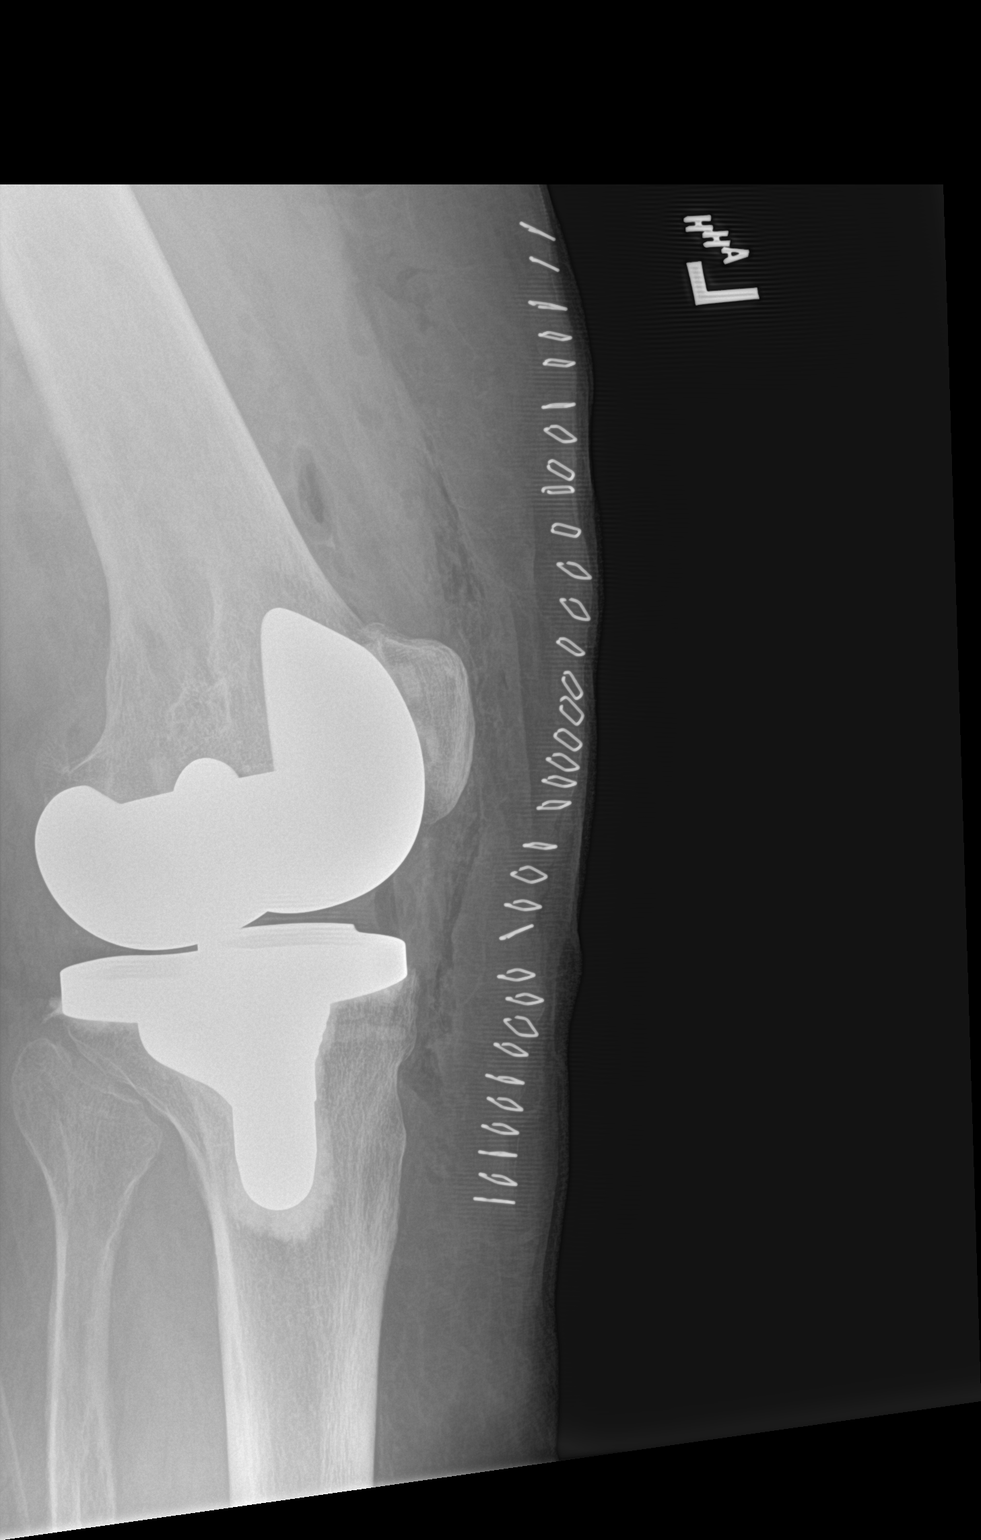

[knee lat (2 of 2)]
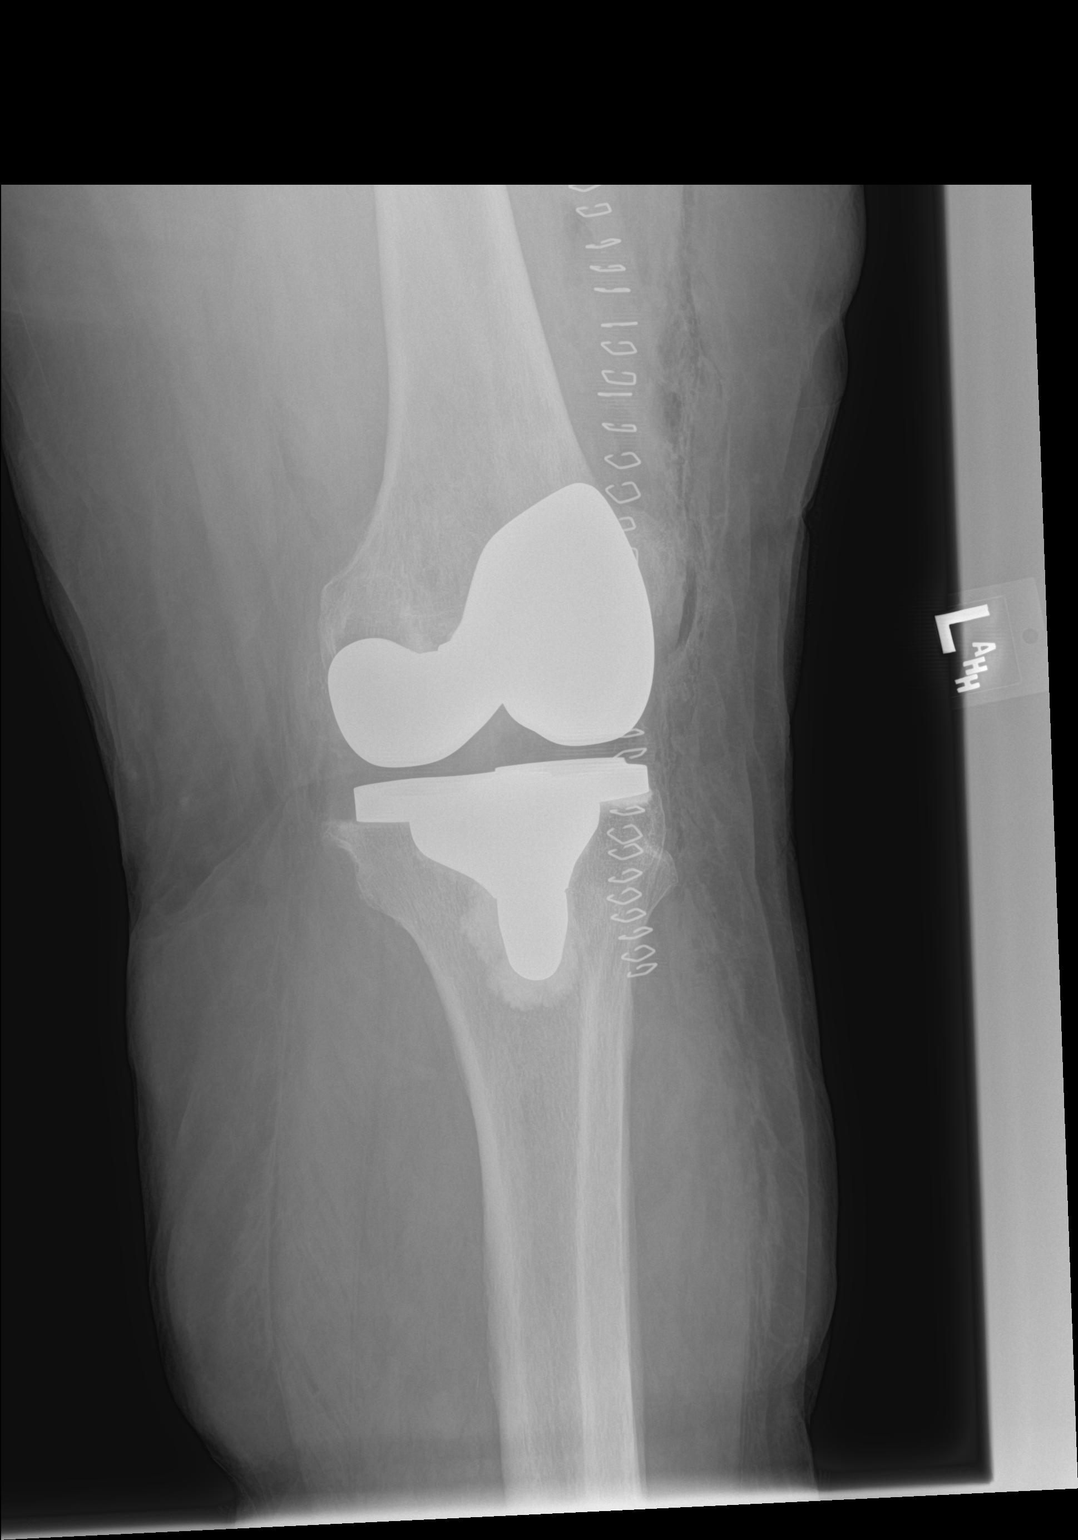

[2 of 2 positions shown; findings below may reference images not displayed]

FINDINGS: Frontal and lateral views were obtained. The patient is status post
total knee replacement with femoral and tibial prosthetic components
well-seated. No acute fracture or dislocation. Air in the joint is
an expected postoperative finding.
IMPRESSION: Prosthetic components well-seated.  No fracture or dislocation.

## 2018-09-03 ENCOUNTER — Ambulatory Visit
Admission: RE | Admit: 2018-09-03 | Discharge: 2018-09-03 | Disposition: A | Discharge status: Home / Self Care | Payer: Medicare Other | Source: Ambulatory Visit | Attending: Internal Medicine | Admitting: Internal Medicine

## 2018-09-03 DIAGNOSIS — Z1231 Encounter for screening mammogram for malignant neoplasm of breast: Secondary | ICD-10-CM

## 2018-09-09 ENCOUNTER — Ambulatory Visit (INDEPENDENT_AMBULATORY_CARE_PROVIDER_SITE_OTHER): Payer: Medicare Other | Admitting: Orthopaedic Surgery

## 2018-10-29 ENCOUNTER — Ambulatory Visit (INDEPENDENT_AMBULATORY_CARE_PROVIDER_SITE_OTHER): Payer: Medicare Other | Admitting: Orthopaedic Surgery

## 2018-10-29 ENCOUNTER — Ambulatory Visit (INDEPENDENT_AMBULATORY_CARE_PROVIDER_SITE_OTHER): Payer: Medicare Other

## 2018-10-29 ENCOUNTER — Encounter (INDEPENDENT_AMBULATORY_CARE_PROVIDER_SITE_OTHER): Payer: Self-pay | Admitting: Orthopaedic Surgery

## 2018-10-29 DIAGNOSIS — Z96652 Presence of left artificial knee joint: Secondary | ICD-10-CM | POA: Diagnosis not present

## 2018-10-29 NOTE — Progress Notes (Signed)
Patient is a very pleasant 72 year old who is 14 months status post left total knee arthroplasty.  He has no issues with at all she states.  She is had no swelling.  It does not feel unstable to her.  She has full motion she states.  She has good strength as well.  On examination of her left knee her incisions well-healed.  Her knee is cool.  It is ligamentously stable.  Her range of motion is full.  The patella tracks well.  An AP and lateral of the left knee shows a well-seated total knee arthroplasty with no complicating features.  At this point she will follow-up as needed.  All questions and concerns were answered and addressed.  She knows if she has any issues with that knee at all she is to come back and see [place].

## 2019-01-19 ENCOUNTER — Ambulatory Visit (INDEPENDENT_AMBULATORY_CARE_PROVIDER_SITE_OTHER): Payer: Medicare Other

## 2019-01-19 ENCOUNTER — Encounter (INDEPENDENT_AMBULATORY_CARE_PROVIDER_SITE_OTHER): Payer: Self-pay | Admitting: Orthopaedic Surgery

## 2019-01-19 ENCOUNTER — Ambulatory Visit (INDEPENDENT_AMBULATORY_CARE_PROVIDER_SITE_OTHER): Payer: Medicare Other | Admitting: Orthopaedic Surgery

## 2019-01-19 VITALS — Ht 65.0 in | Wt 259.0 lb

## 2019-01-19 DIAGNOSIS — M7062 Trochanteric bursitis, left hip: Secondary | ICD-10-CM | POA: Diagnosis not present

## 2019-01-19 DIAGNOSIS — M25552 Pain in left hip: Secondary | ICD-10-CM

## 2019-01-19 MED ORDER — LIDOCAINE HCL 1 % IJ SOLN
3.0000 mL | INTRAMUSCULAR | Status: AC | PRN
  Administered 2019-01-19: 3 mL

## 2019-01-19 MED ORDER — METHYLPREDNISOLONE ACETATE 40 MG/ML IJ SUSP
40.0000 mg | INTRAMUSCULAR | Status: AC | PRN
  Administered 2019-01-19: 40 mg via INTRA_ARTICULAR

## 2019-01-19 NOTE — Progress Notes (Signed)
Office Visit Note   Patient: Kristin [name]           Date of Birth: Sep 15, 1946           MRN: [id number] Visit Date: 01/19/2019              Requested by: Javier Glazier, MD [place], [place] [id number] PCP: Javier Glazier, MD   Assessment & Plan: Visit Diagnoses:  1. Pain in left hip   2. Trochanteric bursitis, left hip     Plan: I do feel her signs and symptoms and physical exam are consistent with trochanteric bursitis.  I did recommend an injection directly over the trochanteric area with a steroid for the area there she certainly most.  I did counsel her about the risk of steroids on her and how this may affect her blood glucose.  I did give her also a prescription for outpatient physical therapy as well.  All question concerns were answered and addressed.  Follow will be as needed.  Follow-Up Instructions: Return if symptoms worsen or fail to improve.   Orders:  Orders Placed This Encounter  Procedures  . Large Joint Inj  . XR HIP UNILAT W OR W/O PELVIS 2-3 VIEWS LEFT   No orders of the defined types were placed in this encounter.     Procedures: Large Joint Inj: L greater trochanter on 01/19/2019 8:34 AM Indications: pain and diagnostic evaluation Details: 22 G 1.5 in needle, lateral approach  Arthrogram: No  Medications: 3 mL lidocaine 1 %; 40 mg methylPREDNISolone acetate 40 MG/ML Outcome: tolerated well, no immediate complications Procedure, treatment alternatives, risks and benefits explained, specific risks discussed. Consent was given by the patient. Immediately prior to procedure a time out was called to verify the correct patient, procedure, equipment, support staff and site/side marked as required. Patient was prepped and draped in the usual sterile fashion.       Clinical Data: No additional findings.   Subjective: Chief Complaint  Patient presents with  . Left Hip - Pain  . Lower Back - Pain  Patient is a very pleasant  73 year old patient of ours that we performed a left total knee arthroplasty on 16 months ago.  She is someone who is moderately morbidly obese with a BMI of 43 she is also had to back surgeries of the lumbar spine.  She start developing left hip pain back in November and cannot take anti-inflammatories.  Check showed a steroid injection recently but this was in her gluteal area just for pain relief but not a specific area.  She feels like she may have sciatica but it does not feel like sciatica should.  She points the lateral aspect of her hip and just above this is source of her pain.  She denies any groin pain.  She denies any recent injuries.  She does report that she cannot sleep on that side at night.  She is a diabetic but reports no adverse effect ever with steroid injections.  HPI  Review of Systems She currently denies any headache, chest pain, shortness of breath, fever, chills, nausea, vomiting  Objective: Vital Signs: Ht 5\' 5"  (1.651 m)   Wt 259 lb (117.5 kg)   BMI 43.10 kg/m   Physical Exam She is alert and orient x3 and in no acute distress Ortho Exam Examination of her left hip shows fluid and full motion with no pain in the groin at all and no blocks to motion.  When I had her lay on her side and palpated of the greater trochanteric area this is where she is exquisitely tender.  It does radiate just to the proximal IT band.  Her knee exam is normal with her total knee placement. Specialty Comments:  No specialty comments available.  Imaging: Xr Hip Unilat W Or W/o Pelvis 2-3 Views Left  Result Date: 01/19/2019 An AP pelvis and lateral left hip shows no acute findings.  The joint space is well-maintained with no significant arthritic changes.    PMFS History: Patient Active Problem List   Diagnosis Date Noted  . 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 Closed nondisplaced fracture of head of right radius with routine healing 07/10/2017  . Chronic pain of  left knee 07/10/2017  . Acute pain of left knee 07/10/2017  . Unilateral primary osteoarthritis, left knee 07/10/2017  . Closed nondisplaced fracture of head of right radius 06/09/2017   Past Medical History:  Diagnosis Date  . Anemia 1992  . Arthritis    oa  . Breast CA ([place]) 1992   lumpectomy with chemo and radiation left breast  . Chronic kidney disease    watching creatining levels sees dr nwbu  . Diabetes mellitus without complication ([place])    type 2   . GERD (gastroesophageal reflux disease)   . Hiatal hernia   . High cholesterol   . Hypertension   . Hypothyroidism   . Personal history of radiation therapy   . Sleep apnea     Family History  Problem Relation Age of Onset  . Breast cancer Mother 27    Past Surgical History:  Procedure Laterality Date  . BACK SURGERY     upper and lower upper back has clamp  . BREAST LUMPECTOMY Left   . CHOLECYSTECTOMY    . TONSILLECTOMY    . TOTAL KNEE ARTHROPLASTY Left 09/12/2017   Procedure: LEFT TOTAL KNEE ARTHROPLASTY;  Surgeon: Mcarthur Rossetti, MD;  Location: [facility];  Service: Orthopedics;  Laterality: Left;  . TUBAL LIGATION     Social History   Occupational History  . Not on file  Tobacco Use  . Smoking status: Never Smoker  . Smokeless tobacco: Never Used  Substance and Sexual Activity  . Alcohol use: Yes    Comment: rare  . Drug use: No  . Sexual activity: Not on file

## 2019-06-13 ENCOUNTER — Emergency Department (HOSPITAL_BASED_OUTPATIENT_CLINIC_OR_DEPARTMENT_OTHER): Payer: Medicare Other

## 2019-06-13 ENCOUNTER — Other Ambulatory Visit: Payer: Self-pay

## 2019-06-13 ENCOUNTER — Encounter (HOSPITAL_BASED_OUTPATIENT_CLINIC_OR_DEPARTMENT_OTHER): Payer: Self-pay | Admitting: *Deleted

## 2019-06-13 ENCOUNTER — Emergency Department (HOSPITAL_BASED_OUTPATIENT_CLINIC_OR_DEPARTMENT_OTHER)
Admission: EM | Admit: 2019-06-13 | Discharge: 2019-06-13 | Disposition: A | Discharge status: Home / Self Care | Payer: Medicare Other | Attending: Emergency Medicine | Admitting: Emergency Medicine

## 2019-06-13 DIAGNOSIS — Y999 Unspecified external cause status: Secondary | ICD-10-CM | POA: Insufficient documentation

## 2019-06-13 DIAGNOSIS — I129 Hypertensive chronic kidney disease with stage 1 through stage 4 chronic kidney disease, or unspecified chronic kidney disease: Secondary | ICD-10-CM | POA: Diagnosis not present

## 2019-06-13 DIAGNOSIS — Y9389 Activity, other specified: Secondary | ICD-10-CM | POA: Diagnosis not present

## 2019-06-13 DIAGNOSIS — W1839XA Other fall on same level, initial encounter: Secondary | ICD-10-CM | POA: Insufficient documentation

## 2019-06-13 DIAGNOSIS — E1122 Type 2 diabetes mellitus with diabetic chronic kidney disease: Secondary | ICD-10-CM | POA: Insufficient documentation

## 2019-06-13 DIAGNOSIS — S022XXA Fracture of nasal bones, initial encounter for closed fracture: Secondary | ICD-10-CM | POA: Insufficient documentation

## 2019-06-13 DIAGNOSIS — Y92009 Unspecified place in unspecified non-institutional (private) residence as the place of occurrence of the external cause: Secondary | ICD-10-CM | POA: Insufficient documentation

## 2019-06-13 DIAGNOSIS — Z7984 Long term (current) use of oral hypoglycemic drugs: Secondary | ICD-10-CM | POA: Insufficient documentation

## 2019-06-13 DIAGNOSIS — Z7901 Long term (current) use of anticoagulants: Secondary | ICD-10-CM | POA: Diagnosis not present

## 2019-06-13 DIAGNOSIS — N189 Chronic kidney disease, unspecified: Secondary | ICD-10-CM | POA: Insufficient documentation

## 2019-06-13 DIAGNOSIS — Z79899 Other long term (current) drug therapy: Secondary | ICD-10-CM | POA: Diagnosis not present

## 2019-06-13 DIAGNOSIS — Z853 Personal history of malignant neoplasm of breast: Secondary | ICD-10-CM | POA: Diagnosis not present

## 2019-06-13 DIAGNOSIS — S0993XA Unspecified injury of face, initial encounter: Secondary | ICD-10-CM | POA: Diagnosis present

## 2019-06-13 DIAGNOSIS — Z96652 Presence of left artificial knee joint: Secondary | ICD-10-CM | POA: Insufficient documentation

## 2019-06-13 MED ORDER — ACETAMINOPHEN 325 MG PO TABS
650.0000 mg | ORAL_TABLET | Freq: Once | ORAL | Status: AC
  Administered 2019-06-13: 04:00:00 650 mg via ORAL

## 2019-06-13 MED ORDER — ACETAMINOPHEN 325 MG PO TABS
ORAL_TABLET | ORAL | Status: AC
  Filled 2019-06-13: qty 2

## 2019-06-13 MED ORDER — OXYMETAZOLINE HCL 0.05 % NA SOLN
1.0000 | Freq: Once | NASAL | Status: AC
  Administered 2019-06-13: 1 via NASAL
  Filled 2019-06-13: qty 30

## 2019-06-13 NOTE — Discharge Instructions (Addendum)
Apply ice as needed.  Avoid forceful blowing of your nose.  You will likely need some swelling and bruising over the next several days.

## 2019-06-13 NOTE — ED Notes (Signed)
Ice pack given. MD at bedside.

## 2019-06-13 NOTE — ED Triage Notes (Addendum)
Pt states that she was up getting a snack at 3am and she bent down and fell on her face hitting her nose on the tile floor.  Denies any loc. Nose with bruising and swelling. Bleeding noted on her kleenex. No nosebleed noted at present.

## 2019-06-13 NOTE — ED Notes (Signed)
MD at bedside. 

## 2019-06-13 NOTE — ED Notes (Addendum)
Pt c/o left wrist and forearm pain. No obvious deformity

## 2019-06-13 NOTE — ED Notes (Signed)
Returned from CT.

## 2019-06-13 NOTE — ED Provider Notes (Signed)
[facility] EMERGENCY DEPARTMENT Provider Note   CSN: [id number] Arrival date & time: 06/13/19  1700     History   Chief Complaint Chief Complaint  Patient presents with   fall    HPI Kristin [name] is a 73 y.o. female.     HPI  This is a 73 year old female with a history of breast cancer, diabetes, hypertension, high cholesterol who presents with nosebleed after a fall.  Patient states that she got up this morning to eat a snack.  She noticed a piece of cheese on the floor.  She bent over to pick it up when she fell forward onto her nose.  She did hit her head.  She did not lose consciousness.  She is not on any anticoagulants.  She noted bleeding from the bilateral naris.  She rates her pain at 4 out of 10.  She has not taken anything for the pain.  She denies neck pain.  She does report some left wrist pain.  She is right-handed.  Denies chest pain, shortness of breath, syncope.  Past Medical History:  Diagnosis Date   Anemia 1992   Arthritis    oa   Breast CA ([place]) 1992   lumpectomy with chemo and radiation left breast   Chronic kidney disease    watching creatining levels sees dr nwbu   Diabetes mellitus without complication ([place])    type 2    GERD (gastroesophageal reflux disease)    Hiatal hernia    High cholesterol    Hypertension    Hypothyroidism    Personal history of radiation therapy    Sleep apnea     Patient Active Problem List   Diagnosis Date Noted   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/2017   Closed nondisplaced fracture of head of right radius with routine healing 07/10/2017   Chronic pain of left knee 07/10/2017   Acute pain of left knee 07/10/2017   Unilateral primary osteoarthritis, left knee 07/10/2017   Closed nondisplaced fracture of head of right radius 06/09/2017    Past Surgical History:  Procedure Laterality Date   BACK SURGERY     upper and lower upper back has clamp   BREAST LUMPECTOMY Left      CHOLECYSTECTOMY     TONSILLECTOMY     TOTAL KNEE ARTHROPLASTY Left 09/12/2017   Procedure: LEFT TOTAL KNEE ARTHROPLASTY;  Surgeon: Mcarthur Rossetti, MD;  Location: [facility];  Service: Orthopedics;  Laterality: Left;   TUBAL LIGATION       OB History   No obstetric history on file.      Home Medications    Prior to Admission medications   Medication Sig Start Date End Date Taking? Authorizing Provider  lisinopril (ZESTRIL) 20 MG tablet Take 20 mg by mouth daily.   Yes [provider]  acetaminophen (TYLENOL) 325 MG tablet Take 650 mg by mouth every 6 (six) hours as needed for headache (pain).    [provider]  amLODipine (NORVASC) 5 MG tablet Take 5 mg by mouth 2 (two) times daily.     [provider]  atorvastatin (LIPITOR) 40 MG tablet Take 40 mg by mouth every evening.     [provider]  Calcium-Magnesium-Vitamin D (CALCIUM 1200+D3 PO) Take 1 tablet by mouth daily.    [provider]  cephALEXin (KEFLEX) 500 MG capsule Take 1 capsule (500 mg total) by mouth 2 (two) times daily. Patient not taking: Reported on 01/19/2019 09/18/17  Orpah Greek, MD  colestipol (COLESTID) 1 g tablet Take 2 g by mouth at bedtime.    [provider]  fluticasone (FLONASE) 50 MCG/ACT nasal spray Place 1 spray into both nostrils at bedtime.     [provider]  furosemide (LASIX) 20 MG tablet Take 10 mg by mouth daily as needed for edema.    [provider]  gabapentin (NEURONTIN) 600 MG tablet Take 600 mg by mouth at bedtime.    [provider]  hydrALAZINE (APRESOLINE) 25 MG tablet  12/25/18   [provider]  HYDROcodone-acetaminophen (NORCO/VICODIN) 5-325 MG tablet Take 1-2 tablets 3 (three) times daily as needed by mouth for moderate pain. Patient not taking: Reported on 01/19/2019 10/27/17   Mcarthur Rossetti, MD  levothyroxine (SYNTHROID, LEVOTHROID) 50 MCG tablet Take 50 mcg by  mouth daily before breakfast.  01/30/17 01/30/18  [provider]  lisinopril (PRINIVIL,ZESTRIL) 30 MG tablet Take 30 mg by mouth 2 (two) times daily.    [provider]  Magnesium Oxide (MAG-200 PO) Take 1-2 tablets by mouth See admin instructions. 400 mg in the morning, and 200 mg at bedtime    [provider]  metFORMIN (GLUCOPHAGE) 500 MG tablet Take 1,000 mg by mouth 2 (two) times daily with a meal.    [provider]  methocarbamol (ROBAXIN) 500 MG tablet Take 1 tablet (500 mg total) by mouth every 6 (six) hours as needed for muscle spasms. Patient not taking: Reported on 01/19/2019 09/13/17   Mcarthur Rossetti, MD  metoprolol (LOPRESSOR) 50 MG tablet Take 50 mg by mouth 2 (two) times daily.    [provider]  Multiple Vitamins-Minerals (MULTIVITAMIN ADULT PO) Take 1 tablet by mouth daily.     [provider]  Omega-3 Fatty Acids (FISH OIL) 1200 MG CAPS Take 1,200 mg by mouth at bedtime.     [provider]  oxyCODONE-acetaminophen (ROXICET) 5-325 MG tablet Take 1-2 tablets by mouth every 4 (four) hours as needed. Patient not taking: Reported on 01/19/2019 09/13/17   Mcarthur Rossetti, MD  pantoprazole (PROTONIX) 40 MG tablet Take 1 tablet (40 mg total) by mouth 2 (two) times daily. 07/03/16   Charlesetta Shanks, MD  rivaroxaban (XARELTO) 10 MG TABS tablet Take 1 tablet (10 mg total) by mouth daily with breakfast. Patient not taking: Reported on 01/19/2019 09/14/17   Mcarthur Rossetti, MD  sitaGLIPtin (JANUVIA) 25 MG tablet Take 25 mg by mouth at bedtime.     [provider]  zolpidem (AMBIEN CR) 12.5 MG CR tablet  01/06/19   [provider]    Family History Family History  Problem Relation Age of Onset   Breast cancer Mother 22    Social History Social History   Tobacco Use   Smoking status: Never Smoker   Smokeless tobacco: Never Used  Substance Use Topics   Alcohol use: Yes    Comment:  rare   Drug use: No     Allergies   Ibuprofen, Levodopa, and Oxycodone   Review of Systems Review of Systems  Constitutional: Negative for fever.  HENT: Positive for nosebleeds.   Respiratory: Negative for shortness of breath.   Cardiovascular: Negative for chest pain.  Gastrointestinal: Negative for abdominal pain, nausea and vomiting.  Musculoskeletal: Negative for neck pain.  Skin: Negative for wound.  Neurological: Negative for dizziness.  All other systems reviewed and are negative.    Physical Exam Updated Vital Signs BP (!) 177/106 (BP Location:  Left Arm)    Pulse 90    Temp 97.7 F (36.5 C)    Resp 20    Ht 1.651 m (5\' 5" )    Wt 118.8 kg    SpO2 100%    BMI 43.60 kg/m   Physical Exam Vitals signs and nursing note reviewed.  Constitutional:      General: She is not in acute distress.    Appearance: She is well-developed. She is obese. She is not ill-appearing.     Comments: ABCs intact  HENT:     Head: Normocephalic.     Comments: Bilateral TMs clear, swelling noted over the nasal bridge, no lacerations or abrasions, scant dried blood noted in the bilateral naris, no septal hematoma noted    Right Ear: Tympanic membrane normal.     Left Ear: Tympanic membrane normal.     Mouth/Throat:     Mouth: Mucous membranes are moist.  Eyes:     Pupils: Pupils are equal, round, and reactive to light.  Neck:     Musculoskeletal: Neck supple.     Comments: No tenderness to palpation over the C-spine Cardiovascular:     Rate and Rhythm: Normal rate and regular rhythm.     Heart sounds: Normal heart sounds.  Pulmonary:     Effort: Pulmonary effort is normal. No respiratory distress.     Breath sounds: No wheezing.  Abdominal:     Palpations: Abdomen is soft.     Tenderness: There is no abdominal tenderness.  Musculoskeletal:     Comments: Normal range of motion of the left wrist, no obvious deformities, 2+ radial pulse  Skin:    General: Skin is warm and dry.   Neurological:     Mental Status: She is alert and oriented to person, place, and time.  Psychiatric:        Mood and Affect: Mood normal.      ED Treatments / Results  Labs (all labs ordered are listed, but only abnormal results are displayed) Labs Reviewed - No data to display  EKG None  Radiology Dg Wrist Complete Left  Result Date: 06/13/2019 CLINICAL DATA:  Left wrist pain after fall. EXAM: LEFT WRIST - COMPLETE 3+ VIEW COMPARISON:  None. FINDINGS: No acute fracture or dislocation. Moderate to severe first CMC joint osteoarthritis. Mild scaphotrapeziotrapezoid osteoarthritis. Bone mineralization is normal. Soft tissues are unremarkable. IMPRESSION: No acute osseous abnormality. Electronically Signed   By: Titus Dubin M.D.   On: 06/13/2019 05:22   Ct Head Wo Contrast  Result Date: 06/13/2019 CLINICAL DATA:  Fall. EXAM: CT HEAD WITHOUT CONTRAST CT MAXILLOFACIAL WITHOUT CONTRAST TECHNIQUE: Multidetector CT imaging of the head and maxillofacial structures were performed using the standard protocol without intravenous contrast. Multiplanar CT image reconstructions of the maxillofacial structures were also generated. COMPARISON:  None. FINDINGS: CT HEAD FINDINGS Brain: No evidence of acute infarction, hemorrhage, hydrocephalus, extra-axial collection or mass lesion/mass effect. Vascular: Atherosclerotic vascular calcification of the carotid siphons. No hyperdense vessel. Skull: Normal. Negative for fracture or focal lesion. Other: Small left parietal scalp hematoma. CT MAXILLOFACIAL FINDINGS Osseous: Acute minimally depressed fracture of the anterior nasal bone. Rightward deviation of the nasal septum. Bilateral TMJ osteoarthritis. Advanced cervical spine facet arthropathy. Orbits: Negative. No traumatic or inflammatory finding. Sinuses: Partial opacification of the nasopharynx and right ethmoid air cells. Small amount of fluid in the right maxillary and sphenoid sinuses. Remaining paranasal  sinuses and mastoid air cells are clear. Soft tissues: Nasal and forehead soft tissue swelling.  IMPRESSION: 1. No acute intracranial abnormality. Small left parietal scalp hematoma. 2. Acute minimally depressed fracture of the anterior nasal bone. Nasal and forehead soft tissue swelling. Electronically Signed   By: Titus Dubin M.D.   On: 06/13/2019 05:20   Ct Maxillofacial Wo Contrast  Result Date: 06/13/2019 CLINICAL DATA:  Fall. EXAM: CT HEAD WITHOUT CONTRAST CT MAXILLOFACIAL WITHOUT CONTRAST TECHNIQUE: Multidetector CT imaging of the head and maxillofacial structures were performed using the standard protocol without intravenous contrast. Multiplanar CT image reconstructions of the maxillofacial structures were also generated. COMPARISON:  None. FINDINGS: CT HEAD FINDINGS Brain: No evidence of acute infarction, hemorrhage, hydrocephalus, extra-axial collection or mass lesion/mass effect. Vascular: Atherosclerotic vascular calcification of the carotid siphons. No hyperdense vessel. Skull: Normal. Negative for fracture or focal lesion. Other: Small left parietal scalp hematoma. CT MAXILLOFACIAL FINDINGS Osseous: Acute minimally depressed fracture of the anterior nasal bone. Rightward deviation of the nasal septum. Bilateral TMJ osteoarthritis. Advanced cervical spine facet arthropathy. Orbits: Negative. No traumatic or inflammatory finding. Sinuses: Partial opacification of the nasopharynx and right ethmoid air cells. Small amount of fluid in the right maxillary and sphenoid sinuses. Remaining paranasal sinuses and mastoid air cells are clear. Soft tissues: Nasal and forehead soft tissue swelling. IMPRESSION: 1. No acute intracranial abnormality. Small left parietal scalp hematoma. 2. Acute minimally depressed fracture of the anterior nasal bone. Nasal and forehead soft tissue swelling. Electronically Signed   By: Titus Dubin M.D.   On: 06/13/2019 05:20    Procedures Procedures (including critical  care time)  Medications Ordered in ED Medications  oxymetazoline (AFRIN) 0.05 % nasal spray 1 spray (has no administration in time range)  acetaminophen (TYLENOL) tablet 650 mg (650 mg Oral Given 06/13/19 0405)     Initial Impression / Assessment and Plan / ED Course  I have reviewed the triage vital signs and the nursing notes.  Pertinent labs & imaging results that were available during my care of the patient were reviewed by me and considered in my medical decision making (see chart for details).        Patient presents after reported mechanical fall.  She is overall nontoxic-appearing.  Vital signs notable for blood pressure 177/106.  She has a nosebleed and some swelling over the nasal bridge.  She is otherwise nontoxic.  Given her age, CT head and max face was obtained.  She was given Tylenol for pain.  CT scan is only notable for an anterior nasal bone fracture.  This is closed on exam.  No septal hematoma or complication noted.  Continue ice at home as needed.  Tylenol for pain.  Follow-up with ENT as needed.  After history, exam, and medical workup I feel the patient has been appropriately medically screened and is safe for discharge home. Pertinent diagnoses were discussed with the patient. Patient was given return precautions.   Final Clinical Impressions(s) / ED Diagnoses   Final diagnoses:  Closed fracture of nasal bone, initial encounter    ED Discharge Orders    None       Merryl Hacker, MD 06/13/19 [phone]

## 2019-07-26 ENCOUNTER — Other Ambulatory Visit: Payer: Self-pay | Admitting: Internal Medicine

## 2019-07-26 DIAGNOSIS — Z1231 Encounter for screening mammogram for malignant neoplasm of breast: Secondary | ICD-10-CM

## 2019-09-07 ENCOUNTER — Ambulatory Visit: Payer: Medicare Other

## 2019-12-13 ENCOUNTER — Ambulatory Visit: Payer: Medicare Other

## 2019-12-23 ENCOUNTER — Ambulatory Visit: Payer: Medicare Other

## 2020-01-31 ENCOUNTER — Ambulatory Visit: Payer: Medicare Other

## 2020-03-16 ENCOUNTER — Other Ambulatory Visit: Payer: Self-pay

## 2020-03-16 ENCOUNTER — Ambulatory Visit
Admission: RE | Admit: 2020-03-16 | Discharge: 2020-03-16 | Disposition: A | Discharge status: Home / Self Care | Payer: Medicare Other | Source: Ambulatory Visit | Attending: Internal Medicine | Admitting: Internal Medicine

## 2020-03-16 DIAGNOSIS — Z1231 Encounter for screening mammogram for malignant neoplasm of breast: Secondary | ICD-10-CM

## 2020-04-25 ENCOUNTER — Other Ambulatory Visit: Payer: Self-pay

## 2020-04-25 ENCOUNTER — Encounter (HOSPITAL_BASED_OUTPATIENT_CLINIC_OR_DEPARTMENT_OTHER): Payer: Self-pay | Admitting: *Deleted

## 2020-04-25 ENCOUNTER — Emergency Department (HOSPITAL_BASED_OUTPATIENT_CLINIC_OR_DEPARTMENT_OTHER): Payer: Medicare Other

## 2020-04-25 ENCOUNTER — Observation Stay (HOSPITAL_BASED_OUTPATIENT_CLINIC_OR_DEPARTMENT_OTHER)
Admission: EM | Admit: 2020-04-25 | Discharge: 2020-04-27 | Disposition: A | Discharge status: Home / Self Care | Payer: Medicare Other | Attending: Internal Medicine | Admitting: Internal Medicine

## 2020-04-25 DIAGNOSIS — Z20822 Contact with and (suspected) exposure to covid-19: Secondary | ICD-10-CM | POA: Insufficient documentation

## 2020-04-25 DIAGNOSIS — I4581 Long QT syndrome: Secondary | ICD-10-CM | POA: Diagnosis not present

## 2020-04-25 DIAGNOSIS — Z7984 Long term (current) use of oral hypoglycemic drugs: Secondary | ICD-10-CM | POA: Insufficient documentation

## 2020-04-25 DIAGNOSIS — R4182 Altered mental status, unspecified: Secondary | ICD-10-CM | POA: Diagnosis present

## 2020-04-25 DIAGNOSIS — Z853 Personal history of malignant neoplasm of breast: Secondary | ICD-10-CM | POA: Diagnosis not present

## 2020-04-25 DIAGNOSIS — N184 Chronic kidney disease, stage 4 (severe): Secondary | ICD-10-CM | POA: Insufficient documentation

## 2020-04-25 DIAGNOSIS — R404 Transient alteration of awareness: Principal | ICD-10-CM | POA: Insufficient documentation

## 2020-04-25 DIAGNOSIS — Z79899 Other long term (current) drug therapy: Secondary | ICD-10-CM | POA: Insufficient documentation

## 2020-04-25 DIAGNOSIS — R9431 Abnormal electrocardiogram [ECG] [EKG]: Secondary | ICD-10-CM | POA: Diagnosis present

## 2020-04-25 DIAGNOSIS — M6281 Muscle weakness (generalized): Secondary | ICD-10-CM | POA: Insufficient documentation

## 2020-04-25 DIAGNOSIS — G459 Transient cerebral ischemic attack, unspecified: Secondary | ICD-10-CM | POA: Diagnosis present

## 2020-04-25 DIAGNOSIS — D508 Other iron deficiency anemias: Secondary | ICD-10-CM | POA: Insufficient documentation

## 2020-04-25 DIAGNOSIS — R41841 Cognitive communication deficit: Secondary | ICD-10-CM | POA: Insufficient documentation

## 2020-04-25 DIAGNOSIS — I4510 Unspecified right bundle-branch block: Secondary | ICD-10-CM | POA: Diagnosis not present

## 2020-04-25 DIAGNOSIS — I129 Hypertensive chronic kidney disease with stage 1 through stage 4 chronic kidney disease, or unspecified chronic kidney disease: Secondary | ICD-10-CM | POA: Diagnosis not present

## 2020-04-25 DIAGNOSIS — E1122 Type 2 diabetes mellitus with diabetic chronic kidney disease: Secondary | ICD-10-CM | POA: Diagnosis not present

## 2020-04-25 DIAGNOSIS — R410 Disorientation, unspecified: Secondary | ICD-10-CM | POA: Diagnosis not present

## 2020-04-25 DIAGNOSIS — D649 Anemia, unspecified: Secondary | ICD-10-CM | POA: Diagnosis present

## 2020-04-25 DIAGNOSIS — E119 Type 2 diabetes mellitus without complications: Secondary | ICD-10-CM

## 2020-04-25 LAB — RAPID URINE DRUG SCREEN, HOSP PERFORMED
Amphetamines: NOT DETECTED
Barbiturates: NOT DETECTED
Benzodiazepines: NOT DETECTED
Cocaine: NOT DETECTED
Opiates: NOT DETECTED
Tetrahydrocannabinol: NOT DETECTED

## 2020-04-25 LAB — DIFFERENTIAL
Abs Immature Granulocytes: 0.07 10*3/uL (ref 0.00–0.07)
Basophils Absolute: 0 10*3/uL (ref 0.0–0.1)
Basophils Relative: 0 %
Eosinophils Absolute: 0.1 10*3/uL (ref 0.0–0.5)
Eosinophils Relative: 2 %
Immature Granulocytes: 1 %
Lymphocytes Relative: 16 %
Lymphs Abs: 1.3 10*3/uL (ref 0.7–4.0)
Monocytes Absolute: 0.6 10*3/uL (ref 0.1–1.0)
Monocytes Relative: 7 %
Neutro Abs: 6.1 10*3/uL (ref 1.7–7.7)
Neutrophils Relative %: 74 %

## 2020-04-25 LAB — URINALYSIS, ROUTINE W REFLEX MICROSCOPIC
Bilirubin Urine: NEGATIVE
Glucose, UA: NEGATIVE mg/dL
Hgb urine dipstick: NEGATIVE
Ketones, ur: NEGATIVE mg/dL
Nitrite: NEGATIVE
Protein, ur: NEGATIVE mg/dL
Specific Gravity, Urine: 1.025 (ref 1.005–1.030)
pH: 5.5 (ref 5.0–8.0)

## 2020-04-25 LAB — CBC
HCT: 34.3 % — ABNORMAL LOW (ref 36.0–46.0)
Hemoglobin: 11.8 g/dL — ABNORMAL LOW (ref 12.0–15.0)
MCH: 34.5 pg — ABNORMAL HIGH (ref 26.0–34.0)
MCHC: 34.4 g/dL (ref 30.0–36.0)
MCV: 100.3 fL — ABNORMAL HIGH (ref 80.0–100.0)
Platelets: 198 10*3/uL (ref 150–400)
RBC: 3.42 MIL/uL — ABNORMAL LOW (ref 3.87–5.11)
RDW: 13.6 % (ref 11.5–15.5)
WBC: 8.2 10*3/uL (ref 4.0–10.5)
nRBC: 0 % (ref 0.0–0.2)

## 2020-04-25 LAB — COMPREHENSIVE METABOLIC PANEL
ALT: 38 U/L (ref 0–44)
AST: 38 U/L (ref 15–41)
Albumin: 3.5 g/dL (ref 3.5–5.0)
Alkaline Phosphatase: 86 U/L (ref 38–126)
Anion gap: 14 (ref 5–15)
BUN: 31 mg/dL — ABNORMAL HIGH (ref 8–23)
CO2: 24 mmol/L (ref 22–32)
Calcium: 9.7 mg/dL (ref 8.9–10.3)
Chloride: 98 mmol/L (ref 98–111)
Creatinine, Ser: 1.8 mg/dL — ABNORMAL HIGH (ref 0.44–1.00)
GFR calc Af Amer: 32 mL/min — ABNORMAL LOW (ref >60)
GFR calc non Af Amer: 27 mL/min — ABNORMAL LOW (ref >60)
Glucose, Bld: 250 mg/dL — ABNORMAL HIGH (ref 70–99)
Potassium: 3.8 mmol/L (ref 3.5–5.1)
Sodium: 136 mmol/L (ref 135–145)
Total Bilirubin: 0.8 mg/dL (ref 0.3–1.2)
Total Protein: 6.5 g/dL (ref 6.5–8.1)

## 2020-04-25 LAB — CBG MONITORING, ED
Glucose-Capillary: 208 mg/dL — ABNORMAL HIGH (ref 70–99)
Glucose-Capillary: 193 mg/dL — ABNORMAL HIGH (ref 70–99)

## 2020-04-25 LAB — URINALYSIS, MICROSCOPIC (REFLEX)

## 2020-04-25 LAB — ETHANOL: Alcohol, Ethyl (B): <10 mg/dL (ref <10)

## 2020-04-25 LAB — PROTIME-INR
INR: 1 (ref 0.8–1.2)
Prothrombin Time: 12.3 seconds (ref 11.4–15.2)

## 2020-04-25 LAB — APTT: aPTT: 23 seconds — ABNORMAL LOW (ref 24–36)

## 2020-04-25 LAB — SARS CORONAVIRUS 2 BY RT PCR (HOSPITAL ORDER, PERFORMED IN ~~LOC~~ HOSPITAL LAB): SARS Coronavirus 2: NEGATIVE

## 2020-04-25 MED ORDER — METFORMIN HCL 500 MG PO TABS
1000.0000 mg | ORAL_TABLET | Freq: Two times a day (BID) | ORAL | Status: DC
  Administered 2020-04-25: 1000 mg via ORAL
  Filled 2020-04-25: qty 2

## 2020-04-25 MED ORDER — SODIUM CHLORIDE 0.9 % IV BOLUS
500.0000 mL | Freq: Once | INTRAVENOUS | Status: AC
  Administered 2020-04-25: 500 mL via INTRAVENOUS

## 2020-04-25 MED ORDER — GABAPENTIN 300 MG PO CAPS
ORAL_CAPSULE | ORAL | Status: AC
  Filled 2020-04-25: qty 2

## 2020-04-25 MED ORDER — AMLODIPINE BESYLATE 5 MG PO TABS
5.0000 mg | ORAL_TABLET | Freq: Two times a day (BID) | ORAL | Status: DC
  Administered 2020-04-25: 5 mg via ORAL
  Filled 2020-04-25: qty 1

## 2020-04-25 MED ORDER — LISINOPRIL 20 MG PO TABS
20.0000 mg | ORAL_TABLET | Freq: Two times a day (BID) | ORAL | Status: DC
  Administered 2020-04-25: 20 mg via ORAL
  Filled 2020-04-25: qty 2

## 2020-04-25 MED ORDER — HYDRALAZINE HCL 25 MG PO TABS
25.0000 mg | ORAL_TABLET | Freq: Two times a day (BID) | ORAL | Status: DC
  Administered 2020-04-25: 25 mg via ORAL
  Filled 2020-04-25: qty 1

## 2020-04-25 MED ORDER — GABAPENTIN 600 MG PO TABS
600.0000 mg | ORAL_TABLET | Freq: Every day | ORAL | Status: DC
  Administered 2020-04-25: 600 mg via ORAL
  Filled 2020-04-25: qty 1

## 2020-04-25 MED ORDER — METOPROLOL TARTRATE 50 MG PO TABS
50.0000 mg | ORAL_TABLET | Freq: Two times a day (BID) | ORAL | Status: DC
  Administered 2020-04-25: 50 mg via ORAL
  Filled 2020-04-25: qty 1

## 2020-04-25 NOTE — ED Provider Notes (Signed)
[name] Provider Note   CSN: [id number] Arrival date & time: 04/25/20  1158     History Chief Complaint  Patient presents with  . Altered Mental Status     Kristin [name] is a 74 y.o. female with hx of breast cancer, CKD, DM, GERD, HTN who presents with altered mental status. Kristin [name] husband is at bedside and provides history from yesterday because she cannot remember most of yesterday. She got out of bed in the morning and just slid out and sat on the ground. Later in the day she went to get ready for a nail appointment and couldn't remember how to get dressed. She put Kristin [name] pants on backwards and left Kristin [name] blouse open. Kristin [name] husband decided to drive Kristin [name] because he didn't think she could drive because she seemed drowsy. She was saying bizarre things like "we forgot the baby" and "we need to go take care of our taxes". She also had an episode of hallucinating when she thought 2 men were in the room. She had garbled speech at times and couldn't make out what she said. She does take [place] every night but denies taking more than usual and has never had a bad reaction to it before. Today he talked to their PCP and they were recommended to come to the ED to be checked out for TIA/CVA. The patient states that she feels completely back to normal. She denies headache dizziness, paresthesias, weakness, gait abnormality. She has been having a problem with a tremor in Kristin [name] arms for several months and dropping things. She denies fever, chest pain, SOB, abdominal pain, N/V/D, urinary symptoms.   HPI     Past Medical History:  Diagnosis Date  . Anemia 1992  . Arthritis    oa  . Breast CA ([place]) 1992   lumpectomy with chemo and radiation left breast  . Chronic kidney disease    watching creatining levels sees dr nwbu  . Diabetes mellitus without complication ([place])    type 2   . GERD (gastroesophageal reflux disease)   . Hiatal hernia   . High cholesterol   . Hypertension     . Hypothyroidism   . Personal history of radiation therapy   . Sleep apnea     Patient Active Problem List   Diagnosis Date Noted  . 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 Closed nondisplaced fracture of head of right radius with routine healing 07/10/2017  . Chronic pain of left knee 07/10/2017  . Acute pain of left knee 07/10/2017  . Unilateral primary osteoarthritis, left knee 07/10/2017  . Closed nondisplaced fracture of head of right radius 06/09/2017    Past Surgical History:  Procedure Laterality Date  . BACK SURGERY     upper and lower upper back has clamp  . BREAST LUMPECTOMY Left   . CHOLECYSTECTOMY    . TONSILLECTOMY    . TOTAL KNEE ARTHROPLASTY Left 09/12/2017   Procedure: LEFT TOTAL KNEE ARTHROPLASTY;  Surgeon: Mcarthur Rossetti, MD;  Location: [facility];  Service: Orthopedics;  Laterality: Left;  . TUBAL LIGATION       OB History   No obstetric history on file.     Family History  Problem Relation Age of Onset  . Breast cancer Mother 70    Social History   Tobacco Use  . Smoking status: Never Smoker  . Smokeless tobacco: Never Used  Substance Use Topics  . Alcohol use: Yes    Comment: rare  .  Drug use: No    Home Medications Prior to Admission medications   Medication Sig Start Date End Date Taking? Authorizing Provider  acetaminophen (TYLENOL) 325 MG tablet Take 650 mg by mouth every 6 (six) hours as needed for headache (pain).    [provider]  amLODipine (NORVASC) 5 MG tablet Take 5 mg by mouth 2 (two) times daily.     [provider]  atorvastatin (LIPITOR) 40 MG tablet Take 40 mg by mouth every evening.     [provider]  Calcium-Magnesium-Vitamin D (CALCIUM 1200+D3 PO) Take 1 tablet by mouth daily.    [provider]  cephALEXin (KEFLEX) 500 MG capsule Take 1 capsule (500 mg total) by mouth 2 (two) times daily. Patient not taking: Reported on 01/19/2019 09/18/17   Kristin Greek, MD  colestipol (COLESTID) 1 g tablet Take 2 g by mouth at bedtime.    [provider]  fluticasone (FLONASE) 50 MCG/ACT nasal spray Place 1 spray into both nostrils at bedtime.     [provider]  furosemide (LASIX) 20 MG tablet Take 10 mg by mouth daily as needed for edema.    [provider]  gabapentin (NEURONTIN) 600 MG tablet Take 600 mg by mouth at bedtime.    [provider]  hydrALAZINE (APRESOLINE) 25 MG tablet  12/25/18   [provider]  HYDROcodone-acetaminophen (NORCO/VICODIN) 5-325 MG tablet Take 1-2 tablets 3 (three) times daily as needed by mouth for moderate pain. Patient not taking: Reported on 01/19/2019 10/27/17   Mcarthur Rossetti, MD  levothyroxine (SYNTHROID, LEVOTHROID) 50 MCG tablet Take 50 mcg by mouth daily before breakfast.  01/30/17 01/30/18  [provider]  lisinopril (PRINIVIL,ZESTRIL) 30 MG tablet Take 30 mg by mouth 2 (two) times daily.    [provider]  lisinopril (ZESTRIL) 20 MG tablet Take 20 mg by mouth daily.    [provider]  Magnesium Oxide (MAG-200 PO) Take 1-2 tablets by mouth See admin instructions. 400 mg in the morning, and 200 mg at bedtime    [provider]  metFORMIN (GLUCOPHAGE) 500 MG tablet Take 1,000 mg by mouth 2 (two) times daily with a meal.    [provider]  methocarbamol (ROBAXIN) 500 MG tablet Take 1 tablet (500 mg total) by mouth every 6 (six) hours as needed for muscle spasms. Patient not taking: Reported on 01/19/2019 09/13/17   Mcarthur Rossetti, MD  metoprolol (LOPRESSOR) 50 MG tablet Take 50 mg by mouth 2 (two) times daily.    [provider]  Multiple Vitamins-Minerals (MULTIVITAMIN ADULT PO) Take 1 tablet by mouth daily.     [provider]  Omega-3 Fatty Acids (FISH OIL) 1200 MG CAPS Take 1,200 mg by mouth at bedtime.     [provider]  oxyCODONE-acetaminophen (ROXICET) 5-325 MG tablet Take 1-2  tablets by mouth every 4 (four) hours as needed. Patient not taking: Reported on 01/19/2019 09/13/17   Mcarthur Rossetti, MD  pantoprazole (PROTONIX) 40 MG tablet Take 1 tablet (40 mg total) by mouth 2 (two) times daily. 07/03/16   Charlesetta Shanks, MD  rivaroxaban (XARELTO) 10 MG TABS tablet Take 1 tablet (10 mg total) by mouth daily with breakfast. Patient not taking: Reported on 01/19/2019 09/14/17   Mcarthur Rossetti, MD  sitaGLIPtin (JANUVIA) 25 MG tablet Take 25 mg by mouth at bedtime.     [provider]  zolpidem (AMBIEN CR) 12.5 MG CR tablet  01/06/19  [provider]    Allergies    Ibuprofen, Levodopa, and Oxycodone  Review of Systems   Review of Systems  Constitutional: Negative for chills and fever.  Respiratory: Negative for shortness of breath.   Cardiovascular: Negative for chest pain.  Gastrointestinal: Negative for abdominal pain, nausea and vomiting.  Musculoskeletal: Negative for back pain.  Neurological: Negative for dizziness, syncope, numbness and headaches.  Psychiatric/Behavioral: Positive for confusion (resolved).  All other systems reviewed and are negative.   Physical Exam Updated Vital Signs BP (!) 150/100   Pulse 93   Temp 98.7 F (37.1 C) (Oral)   Resp 20   Ht 5\' 5"  (1.651 m)   Wt 117.9 kg   SpO2 97%   BMI 43.27 kg/m   Physical Exam Vitals and nursing note reviewed.  Constitutional:      General: She is not in acute distress.    Appearance: She is well-developed. She is obese. She is not ill-appearing.  HENT:     Head: Normocephalic and atraumatic.  Eyes:     General: No scleral icterus.       Right eye: No discharge.        Left eye: No discharge.     Conjunctiva/sclera: Conjunctivae normal.     Pupils: Pupils are equal, round, and reactive to light.  Cardiovascular:     Rate and Rhythm: Normal rate and regular rhythm.  Pulmonary:     Effort: Pulmonary effort is normal. No respiratory distress.     Breath  sounds: Normal breath sounds.  Abdominal:     General: There is no distension.     Palpations: Abdomen is soft.     Tenderness: There is no abdominal tenderness.  Musculoskeletal:     Cervical back: Normal range of motion.  Skin:    General: Skin is warm and dry.  Neurological:     Mental Status: She is alert and oriented to person, place, and time.     Comments: Mental Status:  Alert, oriented, thought content appropriate, able to give a coherent history. Speech fluent without evidence of aphasia. Able to follow 2 step commands without difficulty.  Cranial Nerves:  II:  Peripheral visual fields grossly normal, pupils equal, round, reactive to light III,IV, VI: ptosis not present, extra-ocular motions intact bilaterally  V,VII: smile symmetric, facial light touch sensation equal VIII: hearing grossly normal to voice  X: uvula elevates symmetrically  XI: bilateral shoulder shrug symmetric and strong XII: midline tongue extension without fassiculations Motor:  Normal tone. 5/5 in upper and lower extremities bilaterally including strong and equal grip strength and dorsiflexion/plantar flexion Sensory: Pinprick and light touch normal in all extremities.  Cerebellar: normal finger-to-nose with bilateral upper extremities. Mild action tremor noted. Gait: normal gait and balance CV: distal pulses palpable throughout    Psychiatric:        Behavior: Behavior normal.     ED Results / Procedures / Treatments   Labs (all labs ordered are listed, but only abnormal results are displayed) Labs Reviewed  APTT - Abnormal; Notable for the following components:      Result Value   aPTT 23 (*)    All other components within normal limits  CBC - Abnormal; Notable for the following components:   RBC 3.42 (*)    Hemoglobin 11.8 (*)    HCT 34.3 (*)    MCV 100.3 (*)    MCH 34.5 (*)    All other components within normal limits  COMPREHENSIVE METABOLIC PANEL -  Abnormal; Notable for the  following components:   Glucose, Bld 250 (*)    BUN 31 (*)    Creatinine, Ser 1.80 (*)    GFR calc non Af Amer 27 (*)    GFR calc Af Amer 32 (*)    All other components within normal limits  URINALYSIS, ROUTINE W REFLEX MICROSCOPIC - Abnormal; Notable for the following components:   APPearance HAZY (*)    Leukocytes,Ua SMALL (*)    All other components within normal limits  URINALYSIS, MICROSCOPIC (REFLEX) - Abnormal; Notable for the following components:   Bacteria, UA MANY (*)    All other components within normal limits  CBG MONITORING, ED - Abnormal; Notable for the following components:   Glucose-Capillary 208 (*)    All other components within normal limits  ETHANOL  PROTIME-INR  DIFFERENTIAL  RAPID URINE DRUG SCREEN, HOSP PERFORMED    EKG EKG Interpretation  Date/Time:  Tuesday Apr 25 2020 12:30:37 EDT Ventricular Rate:  86 PR Interval:    QRS Duration: 185 QT Interval:  421 QTC Calculation: 504 R Axis:   -71 Text Interpretation: Sinus rhythm Multiple ventricular premature complexes Borderline prolonged PR interval RBBB and LAFB  -new LVH with secondary repolarization abnormality Inferior infarct, acute (RCA) Probable RV involvement, suggest recording right precordial leads Confirmed by Varney Biles [phone]) on 04/25/2020 12:57:00 PM   Radiology CT Head Wo Contrast  Result Date: 04/25/2020 CLINICAL DATA:  Altered mental status (AMS), unclear cause. EXAM: CT HEAD WITHOUT CONTRAST TECHNIQUE: Contiguous axial images were obtained from the base of the skull through the vertex without intravenous contrast. COMPARISON:  Head CT 06/13/2019. FINDINGS: Brain: There is no acute intracranial hemorrhage. No demarcated cortical infarct. No extra-axial fluid collection. No evidence of intracranial mass. No midline shift. Partially empty sella turcica. Vascular: No hyperdense vessel.  Atherosclerotic calcifications. Skull: Normal. Negative for fracture or focal lesion. Sinuses/Orbits:  Visualized orbits show no acute finding. No significant paranasal sinus disease or mastoid effusion at the imaged levels. IMPRESSION: No evidence of acute intracranial abnormality. Electronically Signed   By: Kellie Simmering DO   On: 04/25/2020 13:59    Procedures Procedures (including critical care time)  Medications Ordered in ED Medications  sodium chloride 0.9 % bolus 500 mL ( Intravenous Stopped 04/25/20 1502)    ED Course  I have reviewed the triage vital signs and the nursing notes.  Pertinent labs & imaging results that were available during my care of the patient were reviewed by me and considered in my medical decision making (see chart for details).  74 year old female who presents with prolonged episode of confusion and some garbled speech yesterday.  Blood pressure is elevated here but otherwise vital signs are normal.  She feels completely back to baseline.  Kristin [name] neurologic exam is grossly normal other than some action tremor in the upper extremities.  CBC shows mild anemia with high MCV.  CMP shows hyperglycemia with stable elevated creatinine.  Urine has many bacteria but also appears contaminated.  Likely this is asymptomatic bacteriuria.  Tox screen is negative.  CT head is negative.  Shared visit with Dr. Kathrynn Humble.  Will consult neurology  Discussed with Dr. Rory Percy.  He states that symptoms are not totally typical for TIA or stroke but agrees that patient is high risk.  ABCD 2 score is 6.  Will request admission.  Discussed with Dr. Sloan Leiter with [facility] who will accept pt for admission.  MDM Rules/Calculators/A&P  Final Clinical Impression(s) / ED Diagnoses Final diagnoses:  Confusion    Rx / DC Orders ED Discharge Orders    None       Recardo Evangelist, PA-C 04/25/20 Lapwai, Ankit, MD 04/27/20 [phone]

## 2020-04-25 NOTE — ED Triage Notes (Signed)
Her husband told her she was confused yesterday. He thought it was related to Ambien.  She feel fine today with no neuro deficits.

## 2020-04-25 NOTE — ED Notes (Signed)
ED Provider at bedside. 

## 2020-04-25 NOTE — ED Notes (Signed)
Pt endorses taking the following medications regularly/daily: Levothyroxine 24mcg Ambien 12.5mg  bedtime Gabapentin 600mg  januvia 25mg  Metformin 500mg /bid lipitor 40mg  Amlodipine 5mg  protonix 40mg  Metoprolol 50mg /bid cholestipol 2 gram bedtime lisinipril 20mg /bid Multi vitamins Magnesium glycinate 1200 mg flonase hydrolizine 25mg /bid Wellbutrin

## 2020-04-25 NOTE — Care Management (Signed)
18 y old. Multiple medical issues. No h/o stroke. She had some confusion yesterday at home. Today at med center she is with no confusion or neurological deficit. Neurology recommended TIA work up.  Accepted for tele obs admission. Neuro will see here   I have not seen patient. This is acceptance note and patient will be seen once she arrives at [facility]. No charge

## 2020-04-25 NOTE — ED Notes (Signed)
[facility] notified (Tammy) - patient ready for transport 

## 2020-04-25 NOTE — ED Notes (Signed)
Pt on monitor 

## 2020-04-26 ENCOUNTER — Observation Stay (HOSPITAL_COMMUNITY): Payer: Medicare Other

## 2020-04-26 ENCOUNTER — Observation Stay (HOSPITAL_BASED_OUTPATIENT_CLINIC_OR_DEPARTMENT_OTHER): Payer: Medicare Other

## 2020-04-26 DIAGNOSIS — R410 Disorientation, unspecified: Secondary | ICD-10-CM | POA: Diagnosis not present

## 2020-04-26 DIAGNOSIS — R4182 Altered mental status, unspecified: Secondary | ICD-10-CM

## 2020-04-26 DIAGNOSIS — G459 Transient cerebral ischemic attack, unspecified: Secondary | ICD-10-CM | POA: Diagnosis not present

## 2020-04-26 DIAGNOSIS — R404 Transient alteration of awareness: Secondary | ICD-10-CM | POA: Diagnosis not present

## 2020-04-26 DIAGNOSIS — D649 Anemia, unspecified: Secondary | ICD-10-CM | POA: Diagnosis present

## 2020-04-26 DIAGNOSIS — R9431 Abnormal electrocardiogram [ECG] [EKG]: Secondary | ICD-10-CM | POA: Diagnosis present

## 2020-04-26 DIAGNOSIS — E119 Type 2 diabetes mellitus without complications: Secondary | ICD-10-CM

## 2020-04-26 DIAGNOSIS — D508 Other iron deficiency anemias: Secondary | ICD-10-CM | POA: Diagnosis not present

## 2020-04-26 DIAGNOSIS — I4510 Unspecified right bundle-branch block: Secondary | ICD-10-CM | POA: Diagnosis present

## 2020-04-26 LAB — LIPID PANEL
Cholesterol: 207 mg/dL — ABNORMAL HIGH (ref 0–200)
HDL: 51 mg/dL (ref >40)
LDL Cholesterol: 84 mg/dL (ref 0–99)
Total CHOL/HDL Ratio: 4.1 RATIO
Triglycerides: 358 mg/dL — ABNORMAL HIGH (ref <150)
VLDL: 72 mg/dL — ABNORMAL HIGH (ref 0–40)

## 2020-04-26 LAB — MAGNESIUM: Magnesium: 1.3 mg/dL — ABNORMAL LOW (ref 1.7–2.4)

## 2020-04-26 LAB — GLUCOSE, CAPILLARY
Glucose-Capillary: 190 mg/dL — ABNORMAL HIGH (ref 70–99)
Glucose-Capillary: 184 mg/dL — ABNORMAL HIGH (ref 70–99)
Glucose-Capillary: 283 mg/dL — ABNORMAL HIGH (ref 70–99)
Glucose-Capillary: 141 mg/dL — ABNORMAL HIGH (ref 70–99)

## 2020-04-26 LAB — HEMOGLOBIN A1C
Hgb A1c MFr Bld: 7.5 % — ABNORMAL HIGH (ref 4.8–5.6)
Mean Plasma Glucose: 169 mg/dL

## 2020-04-26 LAB — FOLATE: Folate: 9 ng/mL (ref >5.9)

## 2020-04-26 LAB — ECHOCARDIOGRAM COMPLETE
Height: 65 in
Weight: 4176.39 oz

## 2020-04-26 LAB — TSH: TSH: 2.011 u[IU]/mL (ref 0.350–4.500)

## 2020-04-26 LAB — VITAMIN B12: Vitamin B-12: 506 pg/mL (ref 180–914)

## 2020-04-26 MED ORDER — STROKE: EARLY STAGES OF RECOVERY BOOK
Freq: Once | Status: AC
  Administered 2020-04-26: 1
  Filled 2020-04-26: qty 1

## 2020-04-26 MED ORDER — MAGNESIUM SULFATE 2 GM/50ML IV SOLN
2.0000 g | Freq: Once | INTRAVENOUS | Status: AC
  Administered 2020-04-26: 2 g via INTRAVENOUS
  Filled 2020-04-26: qty 50

## 2020-04-26 MED ORDER — INSULIN ASPART 100 UNIT/ML ~~LOC~~ SOLN
0.0000 [IU] | Freq: Three times a day (TID) | SUBCUTANEOUS | Status: DC
  Administered 2020-04-26: 2 [IU] via SUBCUTANEOUS
  Administered 2020-04-26: 5 [IU] via SUBCUTANEOUS
  Administered 2020-04-26 – 2020-04-27 (×2): 2 [IU] via SUBCUTANEOUS

## 2020-04-26 MED ORDER — GABAPENTIN 300 MG PO CAPS
600.0000 mg | ORAL_CAPSULE | Freq: Every day | ORAL | Status: DC
  Administered 2020-04-26: 600 mg via ORAL
  Filled 2020-04-26: qty 2

## 2020-04-26 MED ORDER — LEVOTHYROXINE SODIUM 75 MCG PO TABS
75.0000 ug | ORAL_TABLET | Freq: Every day | ORAL | Status: DC
  Administered 2020-04-27: 75 ug via ORAL
  Filled 2020-04-26: qty 1

## 2020-04-26 MED ORDER — ENOXAPARIN SODIUM 40 MG/0.4ML ~~LOC~~ SOLN
40.0000 mg | SUBCUTANEOUS | Status: DC
  Administered 2020-04-26 – 2020-04-27 (×2): 40 mg via SUBCUTANEOUS
  Filled 2020-04-26 (×2): qty 0.4

## 2020-04-26 MED ORDER — ACETAMINOPHEN 650 MG RE SUPP: 650.0000 mg | RECTAL | Status: DC | PRN

## 2020-04-26 MED ORDER — ESCITALOPRAM OXALATE 10 MG PO TABS
20.0000 mg | ORAL_TABLET | Freq: Every day | ORAL | Status: DC
  Filled 2020-04-26 (×2): qty 2

## 2020-04-26 MED ORDER — ACETAMINOPHEN 160 MG/5ML PO SOLN: 650.0000 mg | ORAL | Status: DC | PRN

## 2020-04-26 MED ORDER — BUPROPION HCL ER (XL) 150 MG PO TB24
150.0000 mg | ORAL_TABLET | ORAL | Status: DC
  Administered 2020-04-26: 150 mg via ORAL
  Filled 2020-04-26 (×2): qty 1

## 2020-04-26 MED ORDER — MAGNESIUM OXIDE 400 (241.3 MG) MG PO TABS
400.0000 mg | ORAL_TABLET | Freq: Two times a day (BID) | ORAL | Status: DC
  Administered 2020-04-26 – 2020-04-27 (×2): 400 mg via ORAL
  Filled 2020-04-26 (×2): qty 1

## 2020-04-26 MED ORDER — LORAZEPAM 2 MG/ML IJ SOLN
1.0000 mg | Freq: Four times a day (QID) | INTRAMUSCULAR | Status: AC | PRN
  Administered 2020-04-26: 1 mg via INTRAVENOUS
  Filled 2020-04-26: qty 1

## 2020-04-26 MED ORDER — ACETAMINOPHEN 325 MG PO TABS: 650.0000 mg | ORAL_TABLET | ORAL | Status: DC | PRN

## 2020-04-26 MED ORDER — ASPIRIN 325 MG PO TABS
325.0000 mg | ORAL_TABLET | Freq: Every day | ORAL | Status: DC
  Administered 2020-04-26 – 2020-04-27 (×2): 325 mg via ORAL
  Filled 2020-04-26 (×2): qty 1

## 2020-04-26 MED ORDER — INSULIN ASPART 100 UNIT/ML ~~LOC~~ SOLN: 0.0000 [IU] | Freq: Every day | SUBCUTANEOUS | Status: DC

## 2020-04-26 MED ORDER — ATORVASTATIN CALCIUM 40 MG PO TABS
40.0000 mg | ORAL_TABLET | Freq: Every evening | ORAL | Status: DC
  Administered 2020-04-26: 40 mg via ORAL
  Filled 2020-04-26: qty 1

## 2020-04-26 NOTE — Progress Notes (Signed)
New Admission Note:  Arrival Method: [facility] Mental Orientation:  A&O x4 Telemetry: yes Assessment: Completed Skin: yes, intact IV: Right AC Pain: None Safety Measures: Safety Fall Prevention Plan was given, discussed. Admission: Completed 3W: Patient has been orientated to the room, unit and the staff. Family: none at bedside  Paged admissions and neurology. Admission advised they paged Dr. Marlowe Sax. Will continue to monitor the patient. Call light has been placed within reach and bed alarm has been activated.   Lacretia Leigh, RN

## 2020-04-26 NOTE — Consult Note (Signed)
Referring Physician: Dr. Thereasa Solo    Chief Complaint: Confusion, now resolved  HPI: Kristin [name] is an 74 y.o. female with a history of breast CA s/p lumpectomy and left breast irradiation, CKD, DM, hypercholesterolemia, HTN and sleep apnea who presented to [facility] on Tuesday with confusion. Her husband provided the history to EDP at [facility] as the patient could not remember most of what happened on Monday. She had gotten out of the bed on Monday and had just slid down out of the bed onto the ground, remaining there in a sitting position. Later in the day  she could not remember how to get dressed, putting pants on backwards and leaving her blouse open (dressing apraxia). Her husband noted that she seemed drowsy. He initially thought that her symptoms might be related to Ambien. Her husband then drove her to an appointment and then she was noted to be saying bizarre things such as "we forgot the baby". She also had an episode of delusional thought versus hallucination during which she thought two men were in the room with them. Also had garbled speech at times on Monday. On Tuesday the husband spoke to her PCP, who recommended that the patient go to the ED for TIA/CVA evaluation. She felt completely back to baseline during the evaluation at [facility]. She denied any symptoms of infection as well as no headache, dizziness, weakness or difficulty with gait.   CT head at OSH showed no evidence of acute intracranial abnormality.  ED Course at [facility]: CBC showed mild anemia with high MCV.   CMP showed hyperglycemia with stable elevated creatinine.   Urine had many bacteria but also appeared contaminated and per EDP was most likely due to asymptomatic bacteriuria.   Tox screen was negative.   She was transferred to [facility] for stroke evaluation given an ABCD2 score of 6. She has no prior history of stroke.   LSN: Sunday night.  tPA Given: No: Out of time window.   Past Medical History:  Diagnosis Date  . Anemia 1992   . Arthritis    oa  . Breast CA ([place]) 1992   lumpectomy with chemo and radiation left breast  . Chronic kidney disease    watching creatining levels sees dr nwbu  . Diabetes mellitus without complication ([place])    type 2   . GERD (gastroesophageal reflux disease)   . Hiatal hernia   . High cholesterol   . Hypertension   . Hypothyroidism   . Personal history of radiation therapy   . Sleep apnea     Past Surgical History:  Procedure Laterality Date  . BACK SURGERY     upper and lower upper back has clamp  . BREAST LUMPECTOMY Left   . CHOLECYSTECTOMY    . TONSILLECTOMY    . TOTAL KNEE ARTHROPLASTY Left 09/12/2017   Procedure: LEFT TOTAL KNEE ARTHROPLASTY;  Surgeon: Mcarthur Rossetti, MD;  Location: [facility] ORS;  Service: Orthopedics;  Laterality: Left;  . TUBAL LIGATION      Family History  Problem Relation Age of Onset  . Breast cancer Mother 61   Social History:  reports that she has never smoked. She has never used smokeless tobacco. She reports current alcohol use. She reports that she does not use drugs.  Allergies:  Allergies  Allergen Reactions  . Ibuprofen     Does not take due to kidney function   . Levodopa Other (See Comments)    Increase of dopamine levels  . Oxycodone  Pt cannot tolerate oxycodone- makes her "loopy"/ AMS    Home Medications: Levothyroxine 68mcg Ambien 12.5mg  bedtime Gabapentin 600mg  januvia 25mg  Metformin 500mg /bid lipitor 40mg  Amlodipine 5mg  protonix 40mg  Metoprolol 50mg /bid cholestipol 2 gram bedtime lisinipril 20mg /bid Multi vitamins Magnesium glycinate 1200 mg flonase hydrolizine 25mg /bid Wellbutrin   ROS: As per HPI.   Physical Examination: Blood pressure 132/76, pulse 79, temperature 98 F (36.7 C), temperature source Oral, resp. rate 16, height 5\' 5"  (1.651 m), weight 118.4 kg, SpO2 94 %.  HEENT: [place]/AT Lungs: Respirations unlabored Ext: No edema  Neurologic Examination: Mental Status: Alert, fully  oriented, thought content appropriate.  Speech fluent without evidence of aphasia.  Able to follow all commands without difficulty. Cranial Nerves: II:  Visual fields intact with no extinction to DSS. PERRL.  III,IV, VI: No ptosis. EOMI.  V,VII: Smile symmetric, facial temp sensation equal bilaterally VIII: hearing intact to conversation IX,X: No hypophonia XI: Symmetric XII: midline tongue extension  Motor: Right : Upper extremity   5/5    Left:     Upper extremity   5/5  Lower extremity   5/5     Lower extremity   5/5 Normal tone throughout; no atrophy noted Sensory: Temp and light touch intact throughout, bilaterally Deep Tendon Reflexes:  1+ bilateral brachioradialis Absent left patellar (prior knee operation). Trace right patellar.  Cerebellar: No ataxia with FNF bilaterally  Gait: Deferred   Results for orders placed or performed during the hospital encounter of 04/25/20 (from the past 48 hour(s))  Urine rapid drug screen (hosp performed)     Status: None   Collection Time: 04/25/20 12:56 PM  Result Value Ref Range   Opiates NONE DETECTED NONE DETECTED   Cocaine NONE DETECTED NONE DETECTED   Benzodiazepines NONE DETECTED NONE DETECTED   Amphetamines NONE DETECTED NONE DETECTED   Tetrahydrocannabinol NONE DETECTED NONE DETECTED   Barbiturates NONE DETECTED NONE DETECTED    Comment: (NOTE) DRUG SCREEN FOR MEDICAL PURPOSES ONLY.  IF CONFIRMATION IS NEEDED FOR ANY PURPOSE, NOTIFY LAB WITHIN 5 DAYS. LOWEST DETECTABLE LIMITS FOR URINE DRUG SCREEN Drug Class                     Cutoff (ng/mL) Amphetamine and metabolites    1000 Barbiturate and metabolites    200 Benzodiazepine                 737 Tricyclics and metabolites     300 Opiates and metabolites        300 Cocaine and metabolites        300 THC                            50 Performed at [facility], [address] [place], [place] [address]   Urinalysis, Routine w reflex microscopic     Status:  Abnormal   Collection Time: 04/25/20 12:56 PM  Result Value Ref Range   Color, Urine YELLOW YELLOW   APPearance HAZY (A) CLEAR   Specific Gravity, Urine 1.025 1.005 - 1.030   pH 5.5 5.0 - 8.0   Glucose, UA NEGATIVE NEGATIVE mg/dL   Hgb urine dipstick NEGATIVE NEGATIVE   Bilirubin Urine NEGATIVE NEGATIVE   Ketones, ur NEGATIVE NEGATIVE mg/dL   Protein, ur NEGATIVE NEGATIVE mg/dL   Nitrite NEGATIVE NEGATIVE   Leukocytes,Ua SMALL (A) NEGATIVE    Comment: Performed at [facility], [address] [place], [place] [address]  Urinalysis, Microscopic (reflex)     Status: Abnormal   Collection Time: 04/25/20 12:56 PM  Result Value Ref Range   RBC / HPF 0-5 0 - 5 RBC/hpf   WBC, UA 0-5 0 - 5 WBC/hpf   Bacteria, UA MANY (A) NONE SEEN   Squamous Epithelial / LPF 21-50 0 - 5   Hyaline Casts, UA PRESENT     Comment: Performed at [facility], [address] [place], [place] [address]  Ethanol     Status: None   Collection Time: 04/25/20  1:02 PM  Result Value Ref Range   Alcohol, Ethyl (B) <10 <10 mg/dL    Comment:        LOWEST DETECTABLE LIMIT FOR SERUM ALCOHOL IS 10 mg/dL FOR MEDICAL PURPOSES ONLY Performed at [facility], [address] [place], [place] [address]   Protime-INR     Status: None   Collection Time: 04/25/20  1:02 PM  Result Value Ref Range   Prothrombin Time 12.3 11.4 - 15.2 seconds   INR 1.0 0.8 - 1.2    Comment: (NOTE) INR goal varies based on device and disease states. Performed at [facility], [address] [place], [place] [address]   APTT     Status: Abnormal   Collection Time: 04/25/20  1:02 PM  Result Value Ref Range   aPTT 23 (L) 24 - 36 seconds    Comment: Performed at [facility], [address] [place], [place] [address]  CBC     Status: Abnormal   Collection Time: 04/25/20  1:02 PM  Result Value Ref Range   WBC 8.2 4.0 - 10.5 K/uL   RBC 3.42 (L) 3.87 - 5.11 MIL/uL   Hemoglobin 11.8  (L) 12.0 - 15.0 g/dL   HCT 34.3 (L) 36.0 - 46.0 %   MCV 100.3 (H) 80.0 - 100.0 fL   MCH 34.5 (H) 26.0 - 34.0 pg   MCHC 34.4 30.0 - 36.0 g/dL   RDW 13.6 11.5 - 15.5 %   Platelets 198 150 - 400 K/uL   nRBC 0.0 0.0 - 0.2 %    Comment: Performed at [facility], [address] [place], [place] [address]  Differential     Status: None   Collection Time: 04/25/20  1:02 PM  Result Value Ref Range   Neutrophils Relative % 74 %   Neutro Abs 6.1 1.7 - 7.7 K/uL   Lymphocytes Relative 16 %   Lymphs Abs 1.3 0.7 - 4.0 K/uL   Monocytes Relative 7 %   Monocytes Absolute 0.6 0.1 - 1.0 K/uL   Eosinophils Relative 2 %   Eosinophils Absolute 0.1 0.0 - 0.5 K/uL   Basophils Relative 0 %   Basophils Absolute 0.0 0.0 - 0.1 K/uL   Immature Granulocytes 1 %   Abs Immature Granulocytes 0.07 0.00 - 0.07 K/uL    Comment: Performed at [facility], [address] [place], [place] [address]  Comprehensive metabolic panel     Status: Abnormal   Collection Time: 04/25/20  1:02 PM  Result Value Ref Range   Sodium 136 135 - 145 mmol/L   Potassium 3.8 3.5 - 5.1 mmol/L   Chloride 98 98 - 111 mmol/L   CO2 24 22 - 32 mmol/L   Glucose, Bld 250 (H) 70 - 99 mg/dL    Comment: Glucose reference range applies only to  samples taken after fasting for at least 8 hours.   BUN 31 (H) 8 - 23 mg/dL   Creatinine, Ser 1.80 (H) 0.44 - 1.00 mg/dL   Calcium 9.7 8.9 - 10.3 mg/dL   Total Protein 6.5 6.5 - 8.1 g/dL   Albumin 3.5 3.5 - 5.0 g/dL   AST 38 15 - 41 U/L   ALT 38 0 - 44 U/L   Alkaline Phosphatase 86 38 - 126 U/L   Total Bilirubin 0.8 0.3 - 1.2 mg/dL   GFR calc non Af Amer 27 (L) >60 mL/min   GFR calc Af Amer 32 (L) >60 mL/min   Anion gap 14 5 - 15    Comment: Performed at [facility], [address] [place], [place] [address]  POC CBG, ED     Status: Abnormal   Collection Time: 04/25/20  1:42 PM  Result Value Ref Range   Glucose-Capillary 208 (H) 70 - 99 mg/dL    Comment:  Glucose reference range applies only to samples taken after fasting for at least 8 hours.  SARS Coronavirus 2 by RT PCR (hospital order, performed in [facility] hospital lab) Nasopharyngeal Nasopharyngeal Swab     Status: None   Collection Time: 04/25/20  4:58 PM   Specimen: Nasopharyngeal Swab  Result Value Ref Range   SARS Coronavirus 2 NEGATIVE NEGATIVE    Comment: (NOTE) SARS-CoV-2 target nucleic acids are NOT DETECTED. The SARS-CoV-2 RNA is generally detectable in upper and lower respiratory specimens during the acute phase of infection. The lowest concentration of SARS-CoV-2 viral copies this assay can detect is 250 copies / mL. A negative result does not preclude SARS-CoV-2 infection and should not be used as the sole basis for treatment or other patient management decisions.  A negative result may occur with improper specimen collection / handling, submission of specimen other than nasopharyngeal swab, presence of viral mutation(s) within the areas targeted by this assay, and inadequate number of viral copies (<250 copies / mL). A negative result must be combined with clinical observations, patient history, and epidemiological information. Fact Sheet for Patients:   StrictlyIdeas.no Fact Sheet for Healthcare Providers: BankingDealers.co.za This test is not yet approved or cleared  by the [place] FDA and has been authorized for detection and/or diagnosis of SARS-CoV-2 by FDA under an Emergency Use Authorization (EUA).  This EUA will remain in effect (meaning this test can be used) for the duration of the COVID-19 declaration under Section 564(b)(1) of the Act, 21 U.S.C. section 360bbb-3(b)(1), unless the authorization is terminated or revoked sooner. Performed at [facility], [address] [place], [place] [address]   CBG monitoring, ED     Status: Abnormal   Collection Time: 04/25/20  9:58 PM  Result Value  Ref Range   Glucose-Capillary 193 (H) 70 - 99 mg/dL    Comment: Glucose reference range applies only to samples taken after fasting for at least 8 hours.   CT Head Wo Contrast  Result Date: 04/25/2020 CLINICAL DATA:  Altered mental status (AMS), unclear cause. EXAM: CT HEAD WITHOUT CONTRAST TECHNIQUE: Contiguous axial images were obtained from the base of the skull through the vertex without intravenous contrast. COMPARISON:  Head CT 06/13/2019. FINDINGS: Brain: There is no acute intracranial hemorrhage. No demarcated cortical infarct. No extra-axial fluid collection. No evidence of intracranial mass. No midline shift. Partially empty sella turcica. Vascular: No hyperdense vessel.  Atherosclerotic calcifications. Skull: Normal. Negative for fracture or focal lesion. Sinuses/Orbits:  Visualized orbits show no acute finding. No significant paranasal sinus disease or mastoid effusion at the imaged levels. IMPRESSION: No evidence of acute intracranial abnormality. Electronically Signed   By: Kellie Simmering DO   On: 04/25/2020 13:59    Assessment: 74 y.o. female presenting initially to OSH on Tuesday after approximately 12 hours of confusion on Monday. Confusion has been resolved for > 24 hours.  1. Neurological exam is nonfocal. Cognitively intact.  2. CT head was negative.  3. Stroke Risk Factors - Cancer history, DM, hypercholesterolemia, HTN and sleep apnea  4. Overall impression is that her spell of confusion was secondary to Ambien, either excess dosing the night before or limited absorption of a normal dose of Ambien overnight, followed by absorption in the morning resulting in a toxic delirium. Stroke and seizure are possible, but her presentation would be atypical for such.   Recommendations: 1. MRI of the brain without contrast 2. EEG (ordered)   @Electronically  signed: Dr. Kerney Elbe 04/26/2020, 5:20 AM

## 2020-04-26 NOTE — H&P (Signed)
History and Physical    Kristin [name] QJF:[id number] DOB: 06-Feb-1946 DOA: 04/25/2020  PCP: Javier Glazier, MD Patient coming from: [facility]  Chief Complaint: Altered mental status  HPI: Kristin [name] is a 74 y.o. female with medical history significant of breast cancer status post lumpectomy/chemo/radiation, CKD stage III-IV, non-insulin-dependent type 2 diabetes, hypertension, hyperlipidemia, hypothyroidism, GERD, sleep apnea presented to the ED with her husband for evaluation of altered mental status. No family available at this time.  Per ED provider's conversation with the patient's husband: "Her husband is at bedside and provides history from yesterday because she cannot remember most of yesterday. She got out of bed in the morning and just slid out and sat on the ground. Later in the day she went to get ready for a nail appointment and couldn't remember how to get dressed. She put her pants on backwards and left her blouse open. Her husband decided to drive her because he didn't think she could drive because she seemed drowsy. She was saying bizarre things like "we forgot the baby" and "we need to go take care of our taxes". She also had an episode of hallucinating when she thought 2 men were in the room. She had garbled speech at times and couldn't make out what she said. She does take [place] every night but denies taking more than usual and has never had a bad reaction to it before. Today he talked to their PCP and they were recommended to come to the ED to be checked out for TIA/CVA."  Patient states her husband brought her to the ED as she was confused and acting strangely on 5/17.  She does not remember what happened that day.  She takes Ambien daily for sleep and has been doing so for the past 5 years.  She believes the Ambien was started by a sleep specialist and she was told that it was because she could not get to the deep stage of sleep.  No recent change in the dose of Ambien.  Patient  states she took Ambien the night of 5/16 and then woke up confused the following morning.  She is not sure if she took extra Ambien that night.  Denies any headaches, blurry vision, or focal weakness/numbness.  Denies history of stroke/TIA.  Denies any recent illness, fevers, chills, cough, or chest pain.  Reports chronic intermittent dyspnea on exertion for which she has been evaluated by her physician, no recent change.  No dyspnea at present.  Reports having chronic intermittent episodes of diarrhea for which she has been evaluated by gastroenterology and was told that her diarrhea was related to Metformin use.  Denies abdominal pain, nausea, vomiting, dysuria, or urinary frequency/urgency.  ED Course: Afebrile.  No leukocytosis.  Blood glucose 250.  Blood ethanol level undetectable.  UDS negative.  UA (not clean-catch) with negative nitrite, small amount of leukocytes, 0-5 WBCs, and many bacteria.  Urine culture pending.  Screening SARS-CoV-2 PCR test negative.  Head CT negative for acute intracranial abnormality.  Case was discussed with Dr. Rory Percy from neurology who felt that the patient's symptoms were not typical for TIA or stroke but recommended admission for TIA work-up given risk factors.  Patient was given amlodipine, gabapentin, hydralazine, lisinopril, Metformin, metoprolol, and 500 cc normal saline bolus.  Review of Systems:  All systems reviewed and apart from history of presenting illness, are negative.  Past Medical History:  Diagnosis Date  . Anemia 1992  . Arthritis    oa  .  Breast CA ([place]) 1992   lumpectomy with chemo and radiation left breast  . Chronic kidney disease    watching creatining levels sees dr nwbu  . Diabetes mellitus without complication ([place])    type 2   . GERD (gastroesophageal reflux disease)   . Hiatal hernia   . High cholesterol   . Hypertension   . Hypothyroidism   . Personal history of radiation therapy   . Sleep apnea     Past Surgical  History:  Procedure Laterality Date  . BACK SURGERY     upper and lower upper back has clamp  . BREAST LUMPECTOMY Left   . CHOLECYSTECTOMY    . TONSILLECTOMY    . TOTAL KNEE ARTHROPLASTY Left 09/12/2017   Procedure: LEFT TOTAL KNEE ARTHROPLASTY;  Surgeon: Mcarthur Rossetti, MD;  Location: [facility]S;  Service: Orthopedics;  Laterality: Left;  . TUBAL LIGATION       reports that she has never smoked. She has never used smokeless tobacco. She reports current alcohol use. She reports that she does not use drugs.  Allergies  Allergen Reactions  . Ibuprofen     Does not take due to kidney function   . Levodopa Other (See Comments)    Increase of dopamine levels  . Oxycodone     Pt cannot tolerate oxycodone- makes her "loopy"/ AMS    Family History  Problem Relation Age of Onset  . Breast cancer Mother 65    Prior to Admission medications   Medication Sig Start Date End Date Taking? Authorizing Provider  acetaminophen (TYLENOL) 325 MG tablet Take 650 mg by mouth every 6 (six) hours as needed for headache (pain).    [provider]  amLODipine (NORVASC) 5 MG tablet Take 5 mg by mouth 2 (two) times daily.     [provider]  atorvastatin (LIPITOR) 40 MG tablet Take 40 mg by mouth every evening.     [provider]  Calcium-Magnesium-Vitamin D (CALCIUM 1200+D3 PO) Take 1 tablet by mouth daily.    [provider]  cephALEXin (KEFLEX) 500 MG capsule Take 1 capsule (500 mg total) by mouth 2 (two) times daily. Patient not taking: Reported on 01/19/2019 09/18/17   Orpah Greek, MD  colestipol (COLESTID) 1 g tablet Take 2 g by mouth at bedtime.    [provider]  fluticasone (FLONASE) 50 MCG/ACT nasal spray Place 1 spray into both nostrils at bedtime.     [provider]  furosemide (LASIX) 20 MG tablet Take 10 mg by mouth daily as needed for edema.    [provider]  gabapentin (NEURONTIN) 600 MG tablet Take 600 mg  by mouth at bedtime.    [provider]  hydrALAZINE (APRESOLINE) 25 MG tablet Take 25 mg by mouth in the morning and at bedtime.  12/25/18   [provider]  HYDROcodone-acetaminophen (NORCO/VICODIN) 5-325 MG tablet Take 1-2 tablets 3 (three) times daily as needed by mouth for moderate pain. Patient not taking: Reported on 01/19/2019 10/27/17   Mcarthur Rossetti, MD  levothyroxine (SYNTHROID, LEVOTHROID) 50 MCG tablet Take 50 mcg by mouth daily before breakfast.  01/30/17 01/30/18  [provider]  lisinopril (PRINIVIL,ZESTRIL) 30 MG tablet Take 30 mg by mouth 2 (two) times daily.    [provider]  lisinopril (ZESTRIL) 20 MG tablet Take 20 mg by mouth daily.    [provider]  Magnesium Oxide (MAG-200 PO) Take 1-2 tablets by mouth See admin instructions. [place]  mg in the morning, and 200 mg at bedtime    [provider]  metFORMIN (GLUCOPHAGE) 500 MG tablet Take 1,000 mg by mouth 2 (two) times daily with a meal.    [provider]  methocarbamol (ROBAXIN) 500 MG tablet Take 1 tablet (500 mg total) by mouth every 6 (six) hours as needed for muscle spasms. Patient not taking: Reported on 01/19/2019 09/13/17   Mcarthur Rossetti, MD  metoprolol (LOPRESSOR) 50 MG tablet Take 50 mg by mouth 2 (two) times daily.    [provider]  Multiple Vitamins-Minerals (MULTIVITAMIN ADULT PO) Take 1 tablet by mouth daily.     [provider]  Omega-3 Fatty Acids (FISH OIL) 1200 MG CAPS Take 1,200 mg by mouth at bedtime.     [provider]  oxyCODONE-acetaminophen (ROXICET) 5-325 MG tablet Take 1-2 tablets by mouth every 4 (four) hours as needed. Patient not taking: Reported on 01/19/2019 09/13/17   Mcarthur Rossetti, MD  pantoprazole (PROTONIX) 40 MG tablet Take 1 tablet (40 mg total) by mouth 2 (two) times daily. 07/03/16   Charlesetta Shanks, MD  rivaroxaban (XARELTO) 10 MG TABS tablet Take 1 tablet (10 mg total) by  mouth daily with breakfast. Patient not taking: Reported on 01/19/2019 09/14/17   Mcarthur Rossetti, MD  sitaGLIPtin (JANUVIA) 25 MG tablet Take 25 mg by mouth at bedtime.     [provider]  zolpidem (AMBIEN CR) 12.5 MG CR tablet  01/06/19   [provider]    Physical Exam: Vitals:   04/25/20 2200 04/25/20 2300 04/26/20 0000 04/26/20 0111  BP: (!) 151/81 (!) 149/83 127/84 (!) 142/80  Pulse: 84 79 76 77  Resp: 18 20 (!) 23 17  Temp:    98.8 F (37.1 C)  TempSrc:    Oral  SpO2: 95% 93% 92% 97%  Weight:    118.4 kg  Height:    5\' 5"  (1.651 m)    Physical Exam  Constitutional: She is oriented to person, place, and time. She appears well-developed and well-nourished. No distress.  HENT:  Head: Normocephalic.  Eyes: Pupils are equal, round, and reactive to light. EOM are normal.  Cardiovascular: Normal rate, regular rhythm and intact distal pulses.  Pulmonary/Chest: Effort normal and breath sounds normal. No respiratory distress. She has no wheezes. She has no rales.  Abdominal: Soft. Bowel sounds are normal. She exhibits no distension. There is no abdominal tenderness. There is no guarding.  Musculoskeletal:        General: Edema present.     Cervical back: Neck supple.     Comments: Trace pedal edema  Neurological: She is alert and oriented to person, place, and time. No cranial nerve deficit. Coordination normal.  Speech fluent, tongue midline, no facial droop Strength 5 out of 5 in bilateral upper and lower extremities. Sensation to light touch intact throughout. No dysmetria on finger-to-nose test.  Skin: Skin is warm and dry. She is not diaphoretic.    Labs on Admission: I have personally reviewed following labs and imaging studies  CBC: Recent Labs  Lab 04/25/20 1302  WBC 8.2  NEUTROABS 6.1  HGB 11.8*  HCT 34.3*  MCV 100.3*  PLT 510   Basic Metabolic Panel: Recent Labs  Lab 04/25/20 1302  NA 136  K 3.8  CL 98  CO2 24  GLUCOSE 250*    BUN 31*  CREATININE 1.80*  CALCIUM 9.7   GFR: Estimated Creatinine Clearance: 35.3 mL/min (A) (by C-G formula  based on SCr of 1.8 mg/dL (H)). Liver Function Tests: Recent Labs  Lab 04/25/20 1302  AST 38  ALT 38  ALKPHOS 86  BILITOT 0.8  PROT 6.5  ALBUMIN 3.5   No results for input(s): LIPASE, AMYLASE in the last 168 hours. No results for input(s): AMMONIA in the last 168 hours. Coagulation Profile: Recent Labs  Lab 04/25/20 1302  INR 1.0   Cardiac Enzymes: No results for input(s): CKTOTAL, CKMB, CKMBINDEX, TROPONINI in the last 168 hours. BNP (last 3 results) No results for input(s): PROBNP in the last 8760 hours. HbA1C: No results for input(s): HGBA1C in the last 72 hours. CBG: Recent Labs  Lab 04/25/20 1342 04/25/20 2158  GLUCAP 208* 193*   Lipid Profile: No results for input(s): CHOL, HDL, LDLCALC, TRIG, CHOLHDL, LDLDIRECT in the last 72 hours. Thyroid Function Tests: No results for input(s): TSH, T4TOTAL, FREET4, T3FREE, THYROIDAB in the last 72 hours. Anemia Panel: No results for input(s): VITAMINB12, FOLATE, FERRITIN, TIBC, IRON, RETICCTPCT in the last 72 hours. Urine analysis:    Component Value Date/Time   COLORURINE YELLOW 04/25/2020 1256   APPEARANCEUR HAZY (A) 04/25/2020 1256   LABSPEC 1.025 04/25/2020 1256   PHURINE 5.5 04/25/2020 [place] 04/25/2020 1256   [place] 04/25/2020 [place] 04/25/2020 [place] 04/25/2020 1256   PROTEINUR NEGATIVE 04/25/2020 1256   NITRITE NEGATIVE 04/25/2020 1256   LEUKOCYTESUR SMALL (A) 04/25/2020 1256    Radiological Exams on Admission: CT Head Wo Contrast  Result Date: 04/25/2020 CLINICAL DATA:  Altered mental status (AMS), unclear cause. EXAM: CT HEAD WITHOUT CONTRAST TECHNIQUE: Contiguous axial images were obtained from the base of the skull through the vertex without intravenous contrast. COMPARISON:  Head CT 06/13/2019. FINDINGS: Brain: There is no  acute intracranial hemorrhage. No demarcated cortical infarct. No extra-axial fluid collection. No evidence of intracranial mass. No midline shift. Partially empty sella turcica. Vascular: No hyperdense vessel.  Atherosclerotic calcifications. Skull: Normal. Negative for fracture or focal lesion. Sinuses/Orbits: Visualized orbits show no acute finding. No significant paranasal sinus disease or mastoid effusion at the imaged levels. IMPRESSION: No evidence of acute intracranial abnormality. Electronically Signed   By: Kellie Simmering DO   On: 04/25/2020 13:59    EKG: Independently reviewed. Sinus rhythm with first-degree AV block, LAFB.  RBBB and PVCs new since prior tracing.  QTc 504.  Assessment/Plan Principal Problem:   TIA (transient ischemic attack) Active Problems:   Anemia   RBBB   Diabetes (HCC)   QT prolongation   Possible TIA: Patient had a prolonged episode of confusion and garbled speech on 5/17.  Currently back to baseline.  Neuro exam nonfocal.  Blood ethanol level undetectable.  UDS negative.  Head CT negative for acute intracranial abnormality.  Does take Ambien at home which could possibly be contributing.  Neurology recommended admission for TIA work-up given risk factors. -Telemetry monitoring -Allow for permissive hypertension at this time -MRI of the brain without contrast -Carotid Dopplers -2D echocardiogram -Hemoglobin A1c, fasting lipid panel -Aspirin 325 p.o. now and daily -Continue high intensity statin -Frequent neurochecks -PT, OT, speech therapy. -N.p.o. until cleared by bedside swallow evaluation or formal speech evaluation -Neurology has been consulted -Hold home Ambien  Macrocytic anemia: Hemoglobin 11.8 and MCV 100.3.  Hemoglobin level appears improved compared to prior labs. -Check folate and B12 levels.  Non-insulin-dependent type 2 diabetes: Most recent CBG 193.  Check A1c.  Sliding scale insulin sensitive ACHS and CBG  checks.  RBBB on EKG: Right  bundle branch block on EKG appears new compared to prior tracing from 3 years ago.  No recent tracing for comparison.  Has sleep apnea which could possibly be contributing.  PE less likely given no tachycardia, hypoxia, or signs of respiratory distress.  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 -Check TSH level  QT prolongation EKG: Cardiac monitoring.  Monitor potassium and magnesium levels.  Avoid QT prolonging drugs if possible.  Repeat EKG in a.m.  CKD stage III-IV: Stable.  Creatinine 1.8, at baseline.  Pharmacy med rec pending.  DVT prophylaxis: Lovenox Code Status: Full code Family Communication: No family available at this time. Disposition Plan: Status is: Observation Consults: Neurology Dr. Lisabeth Devoid  The patient remains OBS appropriate and will d/c before 2 midnights.  Dispo: The patient is from: Home              Anticipated d/c is to: Home              Anticipated d/c date is: 1 day              Patient currently is not medically stable to d/c.  The medical decision making on this patient was of high complexity and the patient is at high risk for clinical deterioration, therefore this is a level 3 visit.  Shela Leff MD [facility]  If 7PM-7AM, please contact night-coverage [email or web address]  04/26/2020, 3:52 AM

## 2020-04-26 NOTE — Procedures (Signed)
ELECTROENCEPHALOGRAM REPORT   Patient: Kristin [name]       Room #: 6H20P EEG No. ID: [id number] Age: 74 y.o.        Sex: female Requesting Physician: Thereasa Solo Report Date:  04/26/2020        Interpreting Physician: Alexis Goodell  History: [name] is an 74 y.o. female with altered mental status  Medications:  Synthroid, ASA, Lipitor, Lexapro, Neurontin, Insulin, Mag-Ox  Conditions of Recording:  This is a 21 channel routine scalp EEG performed with bipolar and monopolar montages arranged in accordance to the international 10/20 system of electrode placement. One channel was dedicated to EKG recording.  The patient is in the awake and drowsy states.  Description:  The waking background activity consists of a low voltage, symmetrical, fairly well organized, 9 Hz alpha activity, seen from the parieto-occipital and posterior temporal regions.  Low voltage fast activity, poorly organized, is seen anteriorly and is at times superimposed on more posterior regions.  A mixture of theta and alpha rhythms are seen from the central and temporal regions. The patient drowses with slowing to irregular, low voltage theta and beta activity.   Stage II sleep is not obtained. No epileptiform activity is noted.   Hyperventilation was not performed.  Intermittent photic stimulation was performed but failed to illicit any change in the tracing.     IMPRESSION: Normal electroencephalogram, awake, drowsy and with activation procedures. There are no focal lateralizing or epileptiform features.   Alexis Goodell, MD Neurology [phone] 04/26/2020, 6:22 PM

## 2020-04-26 NOTE — Evaluation (Signed)
Physical Therapy Evaluation Patient Details Name: Kristin [name] MRN: [id number] DOB: 1946-05-22 Today's Date: 04/26/2020   History of Present Illness  Pt is a 74 y/o female with PMH of breast cancer status post lumpectomy/chemo/radiation, CKD stage III-IV, non-insulin-dependent type 2 diabetes, hypertension, hyperlipidemia, hypothyroidism, GERD, sleep apnea presented to the ED with her husband for evaluation of altered mental status. Pt unable to recall events, but pt with lethargy, confusion, "slid to the ground", difficulty dressing, hallucination, and garbled/nonsensical speech. CT negative for acute abnormality, MRI negative.   Clinical Impression   Pt presents with generalized weakness but no focal weakness, mild unsteadiness in standing, and decreased activity tolerance. Pt to benefit from acute PT to address deficits. Pt ambulated good distance in hallway with standing and sitting rest breaks to recover fatigue, pt with no overt LOB but mildly unsteady. This appears to be close to pt baseline. Per pt, her mobility has suffered over the past few months secondary to COVID isolation, and pt is fall risk given current mobility. PT recommending [facility], pt states she is interested. PT to progress mobility as tolerated, and will continue to follow acutely.     Follow Up Recommendations Outpatient PT    Equipment Recommendations  None recommended by PT    Recommendations for Other Services       Precautions / Restrictions Precautions Precautions: Fall Restrictions Weight Bearing Restrictions: No      Mobility  Bed Mobility Overal bed mobility: Needs Assistance Bed Mobility: Supine to Sit;Sit to Supine     Supine to sit: Supervision Sit to supine: Supervision   General bed mobility comments: for safety, use of HOB elevation to perform.  Transfers Overall transfer level: Needs assistance Equipment used: None Transfers: Sit to/from Stand Sit to Stand: Supervision         General transfer comment: for safety, increased time to rise and steady. Upon standing, pt states "I always stand a minute after I stand to be sure"  Ambulation/Gait Ambulation/Gait assistance: Min guard Gait Distance (Feet): 240 Feet Assistive device: None Gait Pattern/deviations: Step-through pattern;Decreased stride length;Shuffle Gait velocity: decr   General Gait Details: Min guard for safety, slow gait with very short steps bilaterally. Rest breaks x2 during ambulation due to DOE 2/4 (sats [facility]), standing and sitting. accepts mild challenge during gait (head turns, directional changes).  Stairs            Wheelchair Mobility    Modified Rankin (Stroke Patients Only)       Balance Overall balance assessment: History of Falls;Needs assistance Sitting-balance support: No upper extremity supported;Feet supported Sitting balance-Leahy Scale: Good     Standing balance support: No upper extremity supported;During functional activity Standing balance-Leahy Scale: Fair Standing balance comment: mildly unsteady in standing, no overt LOB             High level balance activites: Head turns;Turns;Direction changes High Level Balance Comments: no LOB with balance challenges, but increased time             Pertinent Vitals/Pain Pain Assessment: No/denies pain    Home Living Family/patient expects to be discharged to:: Private residence Living Arrangements: Spouse/significant other Available Help at Discharge: Family;Available 24 hours/day Type of Home: House Home Access: Level entry     Home Layout: One level Home Equipment: Shower seat;Cane - single point;Walker - 2 wheels;Grab bars - tub/shower Additional Comments: lives at [facility]    Prior Function Level of Independence: Independent  Comments: driving, light IADLs, med mgmt       Hand Dominance   Dominant Hand: Right    Extremity/Trunk Assessment   Upper Extremity  Assessment Upper Extremity Assessment: Defer to OT evaluation    Lower Extremity Assessment Lower Extremity Assessment: RLE deficits/detail;LLE deficits/detail RLE Deficits / Details: grossly 4/5 LE bilaterally LLE Deficits / Details: grossly 4/5 LE bilaterally    Cervical / Trunk Assessment Cervical / Trunk Assessment: Normal  Communication   Communication: No difficulties  Cognition Arousal/Alertness: Awake/alert Behavior During Therapy: WFL for tasks assessed/performed Overall Cognitive Status: Within Functional Limits for tasks assessed                                        General Comments General comments (skin integrity, edema, etc.): vss    Exercises     Assessment/Plan    PT Assessment Patient needs continued PT services  PT Problem List Decreased strength;Decreased mobility;Decreased activity tolerance;Decreased balance;Decreased knowledge of use of DME       PT Treatment Interventions Therapeutic activities;Gait training;Therapeutic exercise;Patient/family education;Balance training;Functional mobility training;Neuromuscular re-education    PT Goals (Current goals can be found in the Care Plan section)  Acute Rehab PT Goals Patient Stated Goal: to get back home  PT Goal Formulation: With patient Time For Goal Achievement: 05/10/20 Potential to Achieve Goals: Good    Frequency Min 3X/week   Barriers to discharge        Co-evaluation               AM-PAC PT "6 Clicks" Mobility  Outcome Measure Help needed turning from your back to your side while in a flat bed without using bedrails?: None Help needed moving from lying on your back to sitting on the side of a flat bed without using bedrails?: None Help needed moving to and from a bed to a chair (including a wheelchair)?: A Little Help needed standing up from a chair using your arms (e.g., wheelchair or bedside chair)?: None Help needed to walk in hospital room?: A Little Help  needed climbing 3-5 steps with a railing? : A Little 6 Click Score: 21    End of Session   Activity Tolerance: Patient limited by fatigue Patient left: in bed;with call bell/phone within reach;with bed alarm set Nurse Communication: Mobility status PT Visit Diagnosis: Muscle weakness (generalized) (M62.81);Other abnormalities of gait and mobility (R26.89);History of falling (Z91.81)    Time: 2500-3704 PT Time Calculation (min) (ACUTE ONLY): 22 min   Charges:   PT Evaluation $PT Eval Low Complexity: 1 Low         Atiana Levier E, PT Acute Rehabilitation Services Pager [phone]  Office [phone]   Hudsyn Barich D Elonda Husky 04/26/2020, 4:41 PM

## 2020-04-26 NOTE — Evaluation (Signed)
Speech Language Pathology Evaluation Patient Details Name: Niala Stcharles MRN: [id number] DOB: 05/22/1946 Today's Date: 04/26/2020 Time: 4128-7867 SLP Time Calculation (min) (ACUTE ONLY): 16 min  Problem List:  Patient Active Problem List   Diagnosis Date Noted  . Anemia 04/26/2020  . RBBB 04/26/2020  . Diabetes ([place]) 04/26/2020  . QT prolongation 04/26/2020  . TIA (transient ischemic attack) 04/25/2020  . 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 Closed nondisplaced fracture of head of right radius with routine healing 07/10/2017  . Chronic pain of left knee 07/10/2017  . Acute pain of left knee 07/10/2017  . Unilateral primary osteoarthritis, left knee 07/10/2017  . Closed nondisplaced fracture of head of right radius 06/09/2017   Past Medical History:  Past Medical History:  Diagnosis Date  . Anemia 1992  . Arthritis    oa  . Breast CA ([place]) 1992   lumpectomy with chemo and radiation left breast  . Chronic kidney disease    watching creatining levels sees dr nwbu  . Diabetes mellitus without complication ([place])    type 2   . GERD (gastroesophageal reflux disease)   . Hiatal hernia   . High cholesterol   . Hypertension   . Hypothyroidism   . Personal history of radiation therapy   . Sleep apnea    Past Surgical History:  Past Surgical History:  Procedure Laterality Date  . BACK SURGERY     upper and lower upper back has clamp  . BREAST LUMPECTOMY Left   . CHOLECYSTECTOMY    . TONSILLECTOMY    . TOTAL KNEE ARTHROPLASTY Left 09/12/2017   Procedure: LEFT TOTAL KNEE ARTHROPLASTY;  Surgeon: Mcarthur Rossetti, MD;  Location: [facility];  Service: Orthopedics;  Laterality: Left;  . TUBAL LIGATION     HPI:  Pt is a 74 y.o. female with medical history significant of breast cancer status post lumpectomy/chemo/radiation, CKD stage III-IV, non-insulin-dependent type 2 diabetes, hypertension, hyperlipidemia, hypothyroidism, GERD, sleep apnea presented to the ED  with her husband for evaluation of altered mental status and "garbled speech". MRI was negative for acute changes.    Assessment / Plan / Recommendation Clinical Impression  Pt participated in speech/language evaluation. She denied any significant baseline deficits in speech, language, or cognition but she stated that she typically has mild difficulty with memory and uses external memory aids to compensate for this. She stated that her cognitive-linguistic deficits have resolved since admission and that she has not noticed any changes in speech or language. Her speech and language skills are currently within normal limits and no overt cognitive deficits were noted. Further skilled SLP services are not clinically indicated at this time. Pt and nursing were educated regarding results and recommendations; both parties verbalized understanding as well as agreement with plan of care.    SLP Assessment  SLP Recommendation/Assessment: Patient does not need any further Speech Lanaguage Pathology Services SLP Visit Diagnosis: Cognitive communication deficit (R41.841)    Follow Up Recommendations  None    Frequency and Duration           SLP Evaluation Cognition  Overall Cognitive Status: Within Functional Limits for tasks assessed Arousal/Alertness: Awake/alert Orientation Level: Oriented X4 Attention: Focused;Sustained Focused Attention: Appears intact Sustained Attention: Appears intact Memory: Appears intact Awareness: Appears intact Problem Solving: Appears intact Executive Function: Reasoning;Sequencing;Organizing Reasoning: Appears intact Sequencing: Appears intact Organizing: Appears intact       Comprehension  Auditory Comprehension Overall Auditory Comprehension: Appears within functional limits for tasks assessed Yes/No  Questions: Within Functional Limits Basic Biographical Questions: (5/5) Complex Questions: (5/5) Paragraph Comprehension (via yes/no questions):  (4/4) Commands: Within Functional Limits Conversation: Complex    Expression Expression Primary Mode of Expression: Verbal Verbal Expression Overall Verbal Expression: Appears within functional limits for tasks assessed Initiation: No impairment Automatic Speech: Counting;Day of week;Month of year(WNL) Level of Generative/Spontaneous Verbalization: Conversation Repetition: No impairment Naming: No impairment Pragmatics: No impairment Written Expression Dominant Hand: Right   Oral / Motor  Oral Motor/Sensory Function Overall Oral Motor/Sensory Function: Within functional limits Motor Speech Overall Motor Speech: Appears within functional limits for tasks assessed Respiration: Within functional limits Phonation: Normal Resonance: Within functional limits Articulation: Within functional limitis Intelligibility: Intelligible Motor Planning: Witnin functional limits Motor Speech Errors: Not applicable   Doroteo Nickolson I. Hardin Negus, [place], [place] Office number [phone] Pager [place] 04/26/2020, 11:45 AM

## 2020-04-26 NOTE — Progress Notes (Signed)
  Echocardiogram 2D Echocardiogram has been performed.  Kristin [name] Kristin [name] Kristin [name] 04/26/2020, 1:30 PM

## 2020-04-26 NOTE — Progress Notes (Addendum)
EEG Completed; Results Pending  

## 2020-04-26 NOTE — Plan of Care (Signed)
EEG and MRI both unremarkable and within normal limits. Examination already at baseline per Dr. Cheral Marker. Recommendations per Dr. Yvetta Coder morning consultation. Please call neurology with questions as needed.  -- Amie [place], MD [facility]

## 2020-04-26 NOTE — Progress Notes (Signed)
Carotid duplex       has been completed. Preliminary results can be found under CV proc through chart review. Delita Chiquito, BS, RDMS, RVT   

## 2020-04-26 NOTE — Evaluation (Addendum)
Occupational Therapy Evaluation and Discharge  Patient Details Name: Kristin [name] MRN: [id number] DOB: [birth year] Today's Date: 04/26/2020    History of Present Illness 74 y.o. female with medical history significant of breast cancer status post lumpectomy/chemo/radiation, CKD stage III-IV, non-insulin-dependent type 2 diabetes, hypertension, hyperlipidemia, hypothyroidism, GERD, sleep apnea presented to the ED with her husband for evaluation of altered mental status, suspect due to [place].   Clinical Impression   PTA patient independent and driving. Admitted for above and presenting near baseline modified independent level for ADLs, in room mobility and transfers. Cognition WFL (short blessed test scoring 2/28, normal), strength, sensation and balance WFL.  Pt reports feeling back to normal with no residual deficits.  Based on performance today, no further OT needs identified and OT will sign off.     Follow Up Recommendations  No OT follow up    Equipment Recommendations  None recommended by OT    Recommendations for Other Services       Precautions / Restrictions Precautions Precautions: None Restrictions Weight Bearing Restrictions: No      Mobility Bed Mobility Overal bed mobility: Modified Independent             General bed mobility comments: HOB elevated but no assist required  Transfers Overall transfer level: Modified independent                    Balance Overall balance assessment: Mild deficits observed, not formally tested                                         ADL either performed or assessed with clinical judgement   ADL Overall ADL's : Modified independent                                       General ADL Comments: pt demonstrates ability to complete LB dressing, transfers, in room mobility and grooming with modified independence     Vision   Vision Assessment?: No apparent visual deficits      Perception     Praxis      Pertinent Vitals/Pain Pain Assessment: No/denies pain     Hand Dominance Right   Extremity/Trunk Assessment Upper Extremity Assessment Upper Extremity Assessment: Overall WFL for tasks assessed   Lower Extremity Assessment Lower Extremity Assessment: Defer to PT evaluation   Cervical / Trunk Assessment Cervical / Trunk Assessment: Normal   Communication Communication Communication: No difficulties   Cognition Arousal/Alertness: Awake/alert Behavior During Therapy: WFL for tasks assessed/performed Overall Cognitive Status: Within Functional Limits for tasks assessed                                 General Comments: short blessed test: 2/28= normal cog   General Comments  VSS     Exercises     Shoulder Instructions      Home Living Family/patient expects to be discharged to:: Private residence Living Arrangements: Spouse/significant other Available Help at Discharge: Family;Available 24 hours/day Type of Home: House Home Access: Level entry     Home Layout: One level     Bathroom Shower/Tub: Occupational psychologist: Handicapped height     Home Equipment: Shower seat;Cane - single point;Walker - 2 wheels;Grab  bars - tub/shower   Additional Comments: lives [place]   Lives With: Spouse    Prior Functioning/Environment Level of Independence: Independent        Comments: driving, light IADLs, med mgmt          OT Problem List:        OT Treatment/Interventions:      OT Goals(Current goals can be found in the care plan section) Acute Rehab OT Goals Patient Stated Goal: to get back home  OT Goal Formulation: With patient  OT Frequency:     Barriers to D/C:            Co-evaluation              AM-PAC OT "6 Clicks" Daily Activity     Outcome Measure Help from another person eating meals?: None Help from another person taking care of personal grooming?: None Help from  another person toileting, which includes using toliet, bedpan, or urinal?: None Help from another person bathing (including washing, rinsing, drying)?: None Help from another person to put on and taking off regular upper body clothing?: None Help from another person to put on and taking off regular lower body clothing?: None 6 Click Score: 24   End of Session Nurse Communication: Mobility status  Activity Tolerance: Patient tolerated treatment well Patient left: in bed;with call bell/phone within reach;Other (comment)(with EEG )  OT Visit Diagnosis: Other abnormalities of gait and mobility (R26.89);Other symptoms and signs involving cognitive function                Time: 1049-1102 OT Time Calculation (min): 13 min Charges:  OT General Charges $OT Visit: 1 Visit OT Evaluation $OT Eval Low Complexity: 1 Low  Jolaine Artist, OT Acute Rehabilitation Services Pager [phone] Office [phone]   Delight Stare 04/26/2020, 12:19 PM

## 2020-04-26 NOTE — Progress Notes (Signed)
[facility] TEAM 1 - Stepdown/ICU TEAM  Kristin [name]  KCL:[id number] DOB: 1946-09-27 DOA: 04/25/2020 PCP: Javier Glazier, MD    Brief Narrative:  74 year old with a history of breast cancer status post lumpectomy/chemo/radiation, CKD stage III-IV, DM 2, HTN, HLD, hypothyroidism, GERD, and sleep apnea who was brought to the ED by her husband with altered mental status.  The patient awoke in a confused state.  She could not remember how to get herself dressed.  She put her pants on backwards and left her blouse open.  She made nonsensical statements such as "we forgot the baby."  In the ED the patient was afebrile with an undetectable blood alcohol level and a negative urine drug screen.  UA was unrevealing.  CT head was negative for acute abnormality.  Significant Events: 5/19 admit via [place]  Subjective: Pt is seen for a f/u visit.    Assessment & Plan:  Transient altered mental status of unclear etiology Actual event occurred 5/17 -patient now back to baseline -neuro exam nonfocal -blood alcohol level undetectable -urine drug screen negative -CT head negative for acute findings - ?related to Ambien use -being worked up for TIA -MRI brain and EEG pending  Macrocytic anemia Hemoglobin 11.8 with MCV 494.4 -H67 and folic acid not low  Hypomagnesemia  DM 2  Right bundle branch block on EKG No reported history of same  QT prolongation Noted on admit EKG  CKD stage III/IV Baseline creatinine 1.8  DVT prophylaxis: Lovenox Code Status: FULL CODE Family Communication:  Disposition Plan:   Consultants:  Stroke service  Antimicrobials:  None  Objective: Blood pressure 108/62, pulse 72, temperature 97.7 F (36.5 C), temperature source Oral, resp. rate 20, height 5\' 5"  (1.651 m), weight 118.4 kg, SpO2 95 %.  Intake/Output Summary (Last 24 hours) at 04/26/2020 1018 Last data filed at 04/25/2020 1503 Gross per 24 hour  Intake 502.95 ml  Output --  Net 502.95 ml   Filed  Weights   04/25/20 1210 04/25/20 1233 04/26/20 0111  Weight: 117.9 kg 121.1 kg 118.4 kg    Examination: Pt was seen for a f/u visit.    CBC: Recent Labs  Lab 04/25/20 1302  WBC 8.2  NEUTROABS 6.1  HGB 11.8*  HCT 34.3*  MCV 100.3*  PLT 591   Basic Metabolic Panel: Recent Labs  Lab 04/25/20 1302 04/26/20 0415  NA 136  --   K 3.8  --   CL 98  --   CO2 24  --   GLUCOSE 250*  --   BUN 31*  --   CREATININE 1.80*  --   CALCIUM 9.7  --   MG  --  1.3*   GFR: Estimated Creatinine Clearance: 35.3 mL/min (A) (by C-G formula based on SCr of 1.8 mg/dL (H)).  Liver Function Tests: Recent Labs  Lab 04/25/20 1302  AST 38  ALT 38  ALKPHOS 86  BILITOT 0.8  PROT 6.5  ALBUMIN 3.5    Coagulation Profile: Recent Labs  Lab 04/25/20 1302  INR 1.0    HbA1C: Hgb A1c MFr Bld  Date/Time Value Ref Range Status  09/04/2017 09:58 AM 6.4 (H) 4.8 - 5.6 % Final    Comment:    (NOTE) Pre diabetes:          5.7%-6.4% Diabetes:              >6.4% Glycemic control for   <7.0% adults with diabetes     CBG: Recent Labs  Lab  04/25/20 1342 04/25/20 2158 04/26/20 0849  GLUCAP 208* 193* 190*    Recent Results (from the past 240 hour(s))  SARS Coronavirus 2 by RT PCR (hospital order, performed in [facility] hospital lab) Nasopharyngeal Nasopharyngeal Swab     Status: None   Collection Time: 04/25/20  4:58 PM   Specimen: Nasopharyngeal Swab  Result Value Ref Range Status   SARS Coronavirus 2 NEGATIVE NEGATIVE Final    Comment: (NOTE) SARS-CoV-2 target nucleic acids are NOT DETECTED. The SARS-CoV-2 RNA is generally detectable in upper and lower respiratory specimens during the acute phase of infection. The lowest concentration of SARS-CoV-2 viral copies this assay can detect is 250 copies / mL. A negative result does not preclude SARS-CoV-2 infection and should not be used as the sole basis for treatment or other patient management decisions.  A negative result may occur  with improper specimen collection / handling, submission of specimen other than nasopharyngeal swab, presence of viral mutation(s) within the areas targeted by this assay, and inadequate number of viral copies (<250 copies / mL). A negative result must be combined with clinical observations, patient history, and epidemiological information. Fact Sheet for Patients:   StrictlyIdeas.no Fact Sheet for Healthcare Providers: BankingDealers.co.za This test is not yet approved or cleared  by the [place] FDA and has been authorized for detection and/or diagnosis of SARS-CoV-2 by FDA under an Emergency Use Authorization (EUA).  This EUA will remain in effect (meaning this test can be used) for the duration of the COVID-19 declaration under Section 564(b)(1) of the Act, 21 U.S.C. section 360bbb-3(b)(1), unless the authorization is terminated or revoked sooner. Performed at [facility], [address] [place], [place] [address]      Scheduled Meds: . aspirin  325 mg Oral Daily  . atorvastatin  40 mg Oral QPM  . enoxaparin (LOVENOX) injection  40 mg Subcutaneous Q24H  . gabapentin      . insulin aspart  0-5 Units Subcutaneous QHS  . insulin aspart  0-9 Units Subcutaneous TID WC   Continuous Infusions:   LOS: 0 days   Time spent: No Charge  Cherene Altes, MD [facility] Office  [phone] Pager - Text Page per Amion as per below:  On-Call/Text Page:      [email or web address]  If 7PM-7AM, please contact night-coverage [email or web address] 04/26/2020, 10:18 AM

## 2020-04-27 DIAGNOSIS — R404 Transient alteration of awareness: Secondary | ICD-10-CM | POA: Diagnosis not present

## 2020-04-27 DIAGNOSIS — R410 Disorientation, unspecified: Secondary | ICD-10-CM | POA: Diagnosis not present

## 2020-04-27 LAB — COMPREHENSIVE METABOLIC PANEL
ALT: 32 U/L (ref 0–44)
AST: 34 U/L (ref 15–41)
Albumin: 3 g/dL — ABNORMAL LOW (ref 3.5–5.0)
Alkaline Phosphatase: 73 U/L (ref 38–126)
Anion gap: 12 (ref 5–15)
BUN: 29 mg/dL — ABNORMAL HIGH (ref 8–23)
CO2: 22 mmol/L (ref 22–32)
Calcium: 9.5 mg/dL (ref 8.9–10.3)
Chloride: 104 mmol/L (ref 98–111)
Creatinine, Ser: 1.69 mg/dL — ABNORMAL HIGH (ref 0.44–1.00)
GFR calc Af Amer: 34 mL/min — ABNORMAL LOW (ref >60)
GFR calc non Af Amer: 29 mL/min — ABNORMAL LOW (ref >60)
Glucose, Bld: 143 mg/dL — ABNORMAL HIGH (ref 70–99)
Potassium: 4 mmol/L (ref 3.5–5.1)
Sodium: 138 mmol/L (ref 135–145)
Total Bilirubin: 1.1 mg/dL (ref 0.3–1.2)
Total Protein: 5.4 g/dL — ABNORMAL LOW (ref 6.5–8.1)

## 2020-04-27 LAB — MAGNESIUM: Magnesium: 1.5 mg/dL — ABNORMAL LOW (ref 1.7–2.4)

## 2020-04-27 LAB — GLUCOSE, CAPILLARY
Glucose-Capillary: 152 mg/dL — ABNORMAL HIGH (ref 70–99)
Glucose-Capillary: 204 mg/dL — ABNORMAL HIGH (ref 70–99)

## 2020-04-27 LAB — CBC
HCT: 31.9 % — ABNORMAL LOW (ref 36.0–46.0)
Hemoglobin: 10.5 g/dL — ABNORMAL LOW (ref 12.0–15.0)
MCH: 33.8 pg (ref 26.0–34.0)
MCHC: 32.9 g/dL (ref 30.0–36.0)
MCV: 102.6 fL — ABNORMAL HIGH (ref 80.0–100.0)
Platelets: 168 10*3/uL (ref 150–400)
RBC: 3.11 MIL/uL — ABNORMAL LOW (ref 3.87–5.11)
RDW: 13.4 % (ref 11.5–15.5)
WBC: 8 10*3/uL (ref 4.0–10.5)
nRBC: 0 % (ref 0.0–0.2)

## 2020-04-27 MED ORDER — PANTOPRAZOLE SODIUM 40 MG PO TBEC
40.0000 mg | DELAYED_RELEASE_TABLET | Freq: Every day | ORAL | Status: DC
Start: 2020-04-27 — End: 2022-08-25

## 2020-04-27 MED ORDER — MAGNESIUM SULFATE 2 GM/50ML IV SOLN
2.0000 g | Freq: Once | INTRAVENOUS | Status: AC
  Administered 2020-04-27: 2 g via INTRAVENOUS
  Filled 2020-04-27: qty 50

## 2020-04-27 NOTE — Care Management Obs Status (Signed)
[place] NOTIFICATION   Patient Details  Name: Kristin [name] MRN: [id number] Date of Birth: Jul 24, 1946   Medicare Observation Status Notification Given:  Yes    Pollie Friar, RN 04/27/2020, 10:39 AM

## 2020-04-27 NOTE — Plan of Care (Signed)
Patient stable, discussed POC with patient, agreeable with plan. Discussed stroke education via stroke handbook via teach back, verbalizes understanding, denies question/concerns at this time.   

## 2020-04-27 NOTE — TOC Transition Note (Signed)
Transition of Care [facility]) - CM/SW Discharge Note   Patient Details  Name: Kristin [name] MRN: [id number] Date of Birth: 1946-04-21  [facility] CM/SW Contact:  Pollie Friar, RN Phone Number: 04/27/2020, 10:54 AM   Clinical Narrative:    Pt lives at [facility]. Recommendations are for outpatient therapy. She asked to attend at [facility]. CM has called and left message for the therapy department. CM will fax them the needed information once they call back.  Pt has supervision at home and transportation to home.    Final next level of care: OP Rehab Barriers to Discharge: No Barriers Identified   Patient Goals and CMS Choice     Choice offered to / list presented to : Patient  Discharge Placement                       Discharge Plan and Services                                     Social Determinants of Health (SDOH) Interventions     Readmission Risk Interventions No flowsheet data found.

## 2020-04-27 NOTE — Discharge Summary (Signed)
DISCHARGE SUMMARY  Kristin [name]  MR#: [id number]  DOB:1946-10-24  Date of Admission: 04/25/2020 Date of Discharge: 04/27/2020  Attending Physician:Elim Economou Hennie Duos, MD  Patient's CBJ:SEGBTD, Christian Mate, MD  Consults: Neurology / Stroke Team   Disposition: D/C home   Follow-up Appts: Follow-up Information    Kristin Glazier, MD Follow up in 1 week(s).   Specialty: Internal Medicine Contact information: [address] [place] [address] [phone]           Tests Needing Follow-up: -assess mental clarity  -assess Mg level  -follow anemia  -recheck QT once Mg normalized   Discharge Diagnoses: Transient altered mental status of unclear etiology Macrocytic anemia Hypomagnesemia DM 2 Right bundle branch block on EKG QT prolongation CKD stage III/IV  Initial presentation: 74 year old with a history of breast cancer status post lumpectomy/chemo/radiation, CKD stage III-IV, DM 2, HTN, HLD, hypothyroidism, GERD, and sleep apnea who was brought to the ED by her husband with altered mental status.  The patient awoke in a confused state.  She could not remember how to get herself dressed.  She put her pants on backwards and left her blouse open.  She made nonsensical statements such as "we forgot the baby."  In the ED the patient was afebrile with an undetectable blood alcohol level and a negative urine drug screen.  UA was unrevealing.  CT head was negative for acute abnormality.  Hospital Course:  Transient altered mental status of unclear etiology Actual event occurred 5/17 -patient quickly returned to baseline -neuro exam nonfocal th/o hospital stay - blood alcohol level undetectable -urine drug screen negative - CT head negative for acute findings - ?related to Ambien use and possible delayed absorption v/s accidental extra dose - MRI brain and EEG without acute findings   Macrocytic anemia Hemoglobin 11.8 with MCV 485.4 - O27 and folic acid not low - ongoing  outpt f/u indicated   Hypomagnesemia Supplemented during hospital stay - resume usual home dose at time of d/c   DM 2 CBG reasonably controlled th/o hospital stay w/ no hypoglycemia - resume usual DM meds at time of d/c   Right bundle branch block on EKG No reported history of same - no acute issues relate to this   QT prolongation Noted on admit EKG - Mg repleted   CKD stage III/IV Baseline creatinine 1.8 - crt stable th/o the hospital stay   Allergies as of 04/27/2020      Reactions   Ibuprofen    Does not take due to kidney function    Levodopa Other (See Comments)   Increase of dopamine levels   Oxycodone    Pt cannot tolerate oxycodone- makes her "loopy"/ AMS      Medication List    TAKE these medications   amLODipine 5 MG tablet Commonly known as: NORVASC Take 5 mg by mouth 2 (two) times daily.   atorvastatin 40 MG tablet Commonly known as: LIPITOR Take 40 mg by mouth every evening.   buPROPion 150 MG 24 hr tablet Commonly known as: WELLBUTRIN XL Take 150 mg by mouth every other day.   CALCIUM 1200+D3 PO Take 1 tablet by mouth daily.   cyanocobalamin 1000 MCG/ML injection Commonly known as: (VITAMIN B-12) Inject 1,000 mcg into the muscle every 30 (thirty) days.   escitalopram 20 MG tablet Commonly known as: LEXAPRO Take 20 mg by mouth daily.   ferrous sulfate 325 (65 FE) MG tablet Take 325 mg by mouth at bedtime.   Fish  Oil 1200 MG Caps Take 1,200 mg by mouth at bedtime.   fluticasone 50 MCG/ACT nasal spray Commonly known as: FLONASE Place 1 spray into both nostrils at bedtime.   gabapentin 300 MG capsule Commonly known as: NEURONTIN Take 600 mg by mouth at bedtime.   hydrALAZINE 25 MG tablet Commonly known as: APRESOLINE Take 25 mg by mouth in the morning and at bedtime.   Lasix 20 MG tablet Generic drug: furosemide Take 10 mg by mouth daily as needed for edema.   lisinopril 20 MG tablet Commonly known as: ZESTRIL Take 20 mg by  mouth 2 (two) times daily.   MAGNESIUM GLYCINATE PO Take 600 mg by mouth 2 (two) times daily.   metFORMIN 500 MG tablet Commonly known as: GLUCOPHAGE Take 1,000 mg by mouth 2 (two) times daily with a meal.   metoprolol tartrate 50 MG tablet Commonly known as: LOPRESSOR Take 50 mg by mouth 2 (two) times daily.   MULTIVITAMIN ADULT PO Take 1 tablet by mouth daily.   niacin 500 MG tablet Commonly known as: SLO-NIACIN Take 500 mg by mouth 2 (two) times daily.   pantoprazole 40 MG tablet Commonly known as: Protonix Take 1 tablet (40 mg total) by mouth at bedtime.   sitaGLIPtin 25 MG tablet Commonly known as: JANUVIA Take 25 mg by mouth at bedtime.   Synthroid 75 MCG tablet Generic drug: levothyroxine Take 75 mcg by mouth daily.   zolpidem 12.5 MG CR tablet Commonly known as: AMBIEN CR Take 12.5 mg by mouth at bedtime.       Day of Discharge BP (!) 143/84 (BP Location: Right Arm)   Pulse 85   Temp 98.2 F (36.8 C) (Oral)   Resp 16   Ht 5\' 5"  (1.651 m)   Wt 118.4 kg   SpO2 95%   BMI 43.44 kg/m   Physical Exam: General: No acute respiratory distress Lungs: Clear to auscultation bilaterally without wheezes or crackles Cardiovascular: Regular rate and rhythm without murmur gallop or rub normal S1 and S2 Abdomen: Nontender, nondistended, soft, bowel sounds positive, no rebound, no ascites, no appreciable mass Extremities: No significant cyanosis, clubbing, or edema bilateral lower extremities  Basic Metabolic Panel: Recent Labs  Lab 04/25/20 1302 04/26/20 0415 04/27/20 0149  NA 136  --  138  K 3.8  --  4.0  CL 98  --  104  CO2 24  --  22  GLUCOSE 250*  --  143*  BUN 31*  --  29*  CREATININE 1.80*  --  1.69*  CALCIUM 9.7  --  9.5  MG  --  1.3* 1.5*    Liver Function Tests: Recent Labs  Lab 04/25/20 1302 04/27/20 0149  AST 38 34  ALT 38 32  ALKPHOS 86 73  BILITOT 0.8 1.1  PROT 6.5 5.4*  ALBUMIN 3.5 3.0*    Coags: Recent Labs  Lab  04/25/20 1302  INR 1.0    CBC: Recent Labs  Lab 04/25/20 1302 04/27/20 0149  WBC 8.2 8.0  NEUTROABS 6.1  --   HGB 11.8* 10.5*  HCT 34.3* 31.9*  MCV 100.3* 102.6*  PLT 198 168    CBG: Recent Labs  Lab 04/26/20 0849 04/26/20 1201 04/26/20 1611 04/26/20 2110 04/27/20 0617  GLUCAP 190* 184* 283* 141* 152*    Recent Results (from the past 240 hour(s))  SARS Coronavirus 2 by RT PCR (hospital order, performed in [place] hospital lab) Nasopharyngeal Nasopharyngeal Swab     Status: None  Collection Time: 04/25/20  4:58 PM   Specimen: Nasopharyngeal Swab  Result Value Ref Range Status   SARS Coronavirus 2 NEGATIVE NEGATIVE Final    Comment: (NOTE) SARS-CoV-2 target nucleic acids are NOT DETECTED. The SARS-CoV-2 RNA is generally detectable in upper and lower respiratory specimens during the acute phase of infection. The lowest concentration of SARS-CoV-2 viral copies this assay can detect is 250 copies / mL. A negative result does not preclude SARS-CoV-2 infection and should not be used as the sole basis for treatment or other patient management decisions.  A negative result may occur with improper specimen collection / handling, submission of specimen other than nasopharyngeal swab, presence of viral mutation(s) within the areas targeted by this assay, and inadequate number of viral copies (<250 copies / mL). A negative result must be combined with clinical observations, patient history, and epidemiological information. Fact Sheet for Patients:   StrictlyIdeas.no Fact Sheet for Healthcare Providers: BankingDealers.co.za This test is not yet approved or cleared  by the [place] FDA and has been authorized for detection and/or diagnosis of SARS-CoV-2 by FDA under an Emergency Use Authorization (EUA).  This EUA will remain in effect (meaning this test can be used) for the duration of the COVID-19 declaration under  Section 564(b)(1) of the Act, 21 U.S.C. section 360bbb-3(b)(1), unless the authorization is terminated or revoked sooner. Performed at [facility], [address] [place], [place] [address]      Time spent in discharge (includes decision making & examination of pt): 35 minutes  04/27/2020, 10:22 AM   Cherene Altes, MD [facility] Office  [phone]

## 2020-04-27 NOTE — Discharge Instructions (Signed)
Confusion °Confusion is the inability to think with the usual speed or clarity. People who are confused often describe their thinking as cloudy or unclear. Confusion can also include feeling disoriented. This means you are unaware of where you are or who you are. You may also not know the date or time. When confused, you may have difficulty remembering, paying attention, or making decisions. Some people also act aggressively when they are confused. °In some cases, confusion may come on quickly. In other cases, it may develop slowly over time. How quickly confusion comes on depends on the cause. °Confusion may be caused by: °· Head injury (concussion). °· Seizures. °· Stroke. °· Fever. °· Brain tumor. °· Decrease in brain function due to a vascular or neurologic condition (dementia). °· Emotions, like rage or terror. °· Inability to know what is real and what is not (hallucinations). °· Infections, such as a urinary tract infection (UTI). °· Using too much alcohol, drugs, or medicines. °· Loss of fluid (dehydration) or an imbalance of salts in the body (electrolytes). °· Lack of sleep. °· Low blood sugar (diabetes). °· Low levels of oxygen. This comes from conditions such as chronic lung disorders. °· Side effects of medicines, or taking medicines that affect other medicines (drug interactions). °· Lack of certain nutrients, especially niacin, thiamine, vitamin C, or vitamin B. °· Sudden drop in body temperature (hypothermia). °· Change in routine, such as traveling or being hospitalized. °Follow these instructions at home: °Pay attention to your symptoms. Tell your health care provider about any changes or if you develop new symptoms. Follow these instructions to control or treat symptoms. Ask a family member or friend for help if needed. °Medicines °· Take over-the-counter and prescription medicines only as told by your health care provider. °· Ask your health care provider about changing or stopping any medicines  that may be causing your confusion. °· Avoid pain medicines or sleep medicines until you have fully recovered. °· Use a pillbox or an alarm to help you take the right medicines at the right time. °Lifestyle ° °· Eat a balanced diet that includes fruits and vegetables. °· Get enough sleep. For most adults, this is 7-9 hours each night. °· Do not drink alcohol. °· Do not become isolated. Spend time with other people and make plans for your days. °· Do not drive until your health care provider says that it is safe to do so. °· Do not use any products that contain nicotine or tobacco, such as cigarettes and e-cigarettes. If you need help quitting, ask your health care provider. °· Stop other activities that may increase your chances of getting hurt. These may include some work duties, sports activities, swimming, or bike riding. Ask your health care provider what activities are safe for you. °What caregivers can do °· Find out if the person is confused. Ask the person to state his or her name, age, and the date. If the person is unsure or answers incorrectly, he or she may be confused. °· Always introduce yourself, no matter how well the person knows you. °· Remind the person of his or her location. Do this often. °· Place a calendar and clock near the person who is confused. °· Talk about current events and plans for the day. °· Keep the environment calm, quiet, and peaceful. °· Help the person do the things that he or she is unable to do. These include: °? Taking medicines. °? Keeping follow-up visits with his or her health care   provider. °? Helping with household duties, including meal preparation. °? Running errands. °· Get help if you need it. There are several support groups for caregivers. °· If the person you are helping needs more support, consider day care, extended care programs, or a skilled nursing facility. The person's health care provider may be able to help evaluate these options. °General  instructions °· Monitor yourself for any conditions you may have. These may include: °? Checking your blood glucose levels, if you have diabetes. °? Watching your weight, if you are overweight. °? Monitoring your blood pressure, if you have hypertension. °? Monitoring your body temperature, if you have a fever. °· Keep all follow-up visits as told by your health care provider. This is important. °Contact a health care provider if: °· Your symptoms get worse. °Get help right away if you: °· Feel that you are not able to care for yourself. °· Develop severe headaches, repeated vomiting, seizures, blackouts, or slurred speech. °· Have increasing confusion, weakness, numbness, restlessness, or personality changes. °· Develop a loss of balance, have marked dizziness, feel uncoordinated, or fall. °· Develop severe anxiety, or you have delusions or hallucinations. °These symptoms may represent a serious problem that is an emergency. Do not wait to see if the symptoms will go away. Get medical help right away. Call your local emergency services (911 in the [place] Do not drive yourself to the hospital. °Summary °· Confusion is the inability to think with the usual speed or clarity. People who are confused often describe their thinking as cloudy or unclear. °· Confusion can also include having difficulty remembering, paying attention, or making decisions. °· Confusion may come on quickly or develop slowly over time, depending on the cause. There are many different causes of confusion. °· Ask for help from family members or friends if you are unable to take care of yourself. °This information is not intended to replace advice given to you by your health care provider. Make sure you discuss any questions you have with your health care provider. °Document Revised: 11/27/2017 Document Reviewed: 11/27/2017 °Elsevier Patient Education © 2020 [employer] ° °

## 2020-04-27 NOTE — Progress Notes (Signed)
MEWS noted in yellow. No changes from am assessment. No changes in VS with recheck. Continue to monitor.

## 2021-01-31 ENCOUNTER — Other Ambulatory Visit: Payer: Self-pay | Admitting: Internal Medicine

## 2021-01-31 DIAGNOSIS — Z1231 Encounter for screening mammogram for malignant neoplasm of breast: Secondary | ICD-10-CM

## 2021-03-28 ENCOUNTER — Ambulatory Visit: Payer: Medicare Other

## 2021-05-15 ENCOUNTER — Ambulatory Visit
Admission: RE | Admit: 2021-05-15 | Discharge: 2021-05-15 | Disposition: A | Discharge status: Home / Self Care | Payer: Medicare Other | Source: Ambulatory Visit | Attending: Internal Medicine | Admitting: Internal Medicine

## 2021-05-15 ENCOUNTER — Other Ambulatory Visit: Payer: Self-pay

## 2021-05-15 DIAGNOSIS — Z1231 Encounter for screening mammogram for malignant neoplasm of breast: Secondary | ICD-10-CM

## 2021-10-08 ENCOUNTER — Other Ambulatory Visit (HOSPITAL_BASED_OUTPATIENT_CLINIC_OR_DEPARTMENT_OTHER): Payer: Self-pay | Admitting: Nurse Practitioner

## 2021-10-08 DIAGNOSIS — M4726 Other spondylosis with radiculopathy, lumbar region: Secondary | ICD-10-CM

## 2021-10-13 ENCOUNTER — Other Ambulatory Visit: Payer: Self-pay

## 2021-10-13 ENCOUNTER — Ambulatory Visit (HOSPITAL_BASED_OUTPATIENT_CLINIC_OR_DEPARTMENT_OTHER)
Admission: RE | Admit: 2021-10-13 | Discharge: 2021-10-13 | Disposition: A | Discharge status: Home / Self Care | Payer: Medicare Other | Source: Ambulatory Visit | Attending: Nurse Practitioner | Admitting: Nurse Practitioner

## 2021-10-13 DIAGNOSIS — M4726 Other spondylosis with radiculopathy, lumbar region: Secondary | ICD-10-CM | POA: Insufficient documentation

## 2021-10-13 MED ORDER — GADOBUTROL 1 MMOL/ML IV SOLN
10.0000 mL | Freq: Once | INTRAVENOUS | Status: AC | PRN
  Administered 2021-10-13: 10 mL via INTRAVENOUS

## 2022-01-14 ENCOUNTER — Other Ambulatory Visit: Payer: Self-pay

## 2022-01-14 ENCOUNTER — Ambulatory Visit (INDEPENDENT_AMBULATORY_CARE_PROVIDER_SITE_OTHER): Payer: Medicare Other

## 2022-01-14 ENCOUNTER — Ambulatory Visit (INDEPENDENT_AMBULATORY_CARE_PROVIDER_SITE_OTHER): Payer: Medicare Other | Admitting: Orthopaedic Surgery

## 2022-01-14 VITALS — Ht 65.0 in | Wt 240.0 lb

## 2022-01-14 DIAGNOSIS — M25561 Pain in right knee: Secondary | ICD-10-CM | POA: Diagnosis not present

## 2022-01-14 DIAGNOSIS — G8929 Other chronic pain: Secondary | ICD-10-CM

## 2022-01-14 DIAGNOSIS — M1711 Unilateral primary osteoarthritis, right knee: Secondary | ICD-10-CM

## 2022-01-14 MED ORDER — LIDOCAINE HCL 1 % IJ SOLN
3.0000 mL | INTRAMUSCULAR | Status: AC | PRN
  Administered 2022-01-14: 3 mL

## 2022-01-14 MED ORDER — METHYLPREDNISOLONE ACETATE 40 MG/ML IJ SUSP
40.0000 mg | INTRAMUSCULAR | Status: AC | PRN
  Administered 2022-01-14: 40 mg via INTRA_ARTICULAR

## 2022-01-14 NOTE — Progress Notes (Signed)
Office Visit Note   Patient: Kristin [name]           Date of Birth: May 22, 1946           MRN: [id number] Visit Date: 01/14/2022              Requested by: Javier Glazier, MD [address],  [place] [address] PCP: Javier Glazier, MD   Assessment & Plan: Visit Diagnoses:  1. Chronic pain of right knee   2. Unilateral primary osteoarthritis, right knee     Plan: She does have moderate tricompartment arthritis in the right knee but it has not changed much since 2018 when I have x-rays of both her knees.  We are going to try to stay conservative so I recommended Voltaren gel 2-4 times a day to the medial aspect of her knee and a steroid injection as well which she agreed to and tolerated it well.  I would like to see her back in 4 weeks to see how she is doing from a steroid injection to see if she would consider hyaluronic acid for her right knee as well.  I did give her handout about that.  All questions and concerns were answered and addressed.  Follow-Up Instructions: Return in about 4 weeks (around 02/11/2022).   Orders:  Orders Placed This Encounter  Procedures   Large Joint Inj   XR Knee 1-2 Views Right   No orders of the defined types were placed in this encounter.     Procedures: Large Joint Inj: R knee on 01/14/2022 2:20 PM Indications: diagnostic evaluation and pain Details: 22 G 1.5 in needle, superolateral approach  Arthrogram: No  Medications: 3 mL lidocaine 1 %; 40 mg methylPREDNISolone acetate 40 MG/ML Outcome: tolerated well, no immediate complications Procedure, treatment alternatives, risks and benefits explained, specific risks discussed. Consent was given by the patient. Immediately prior to procedure a time out was called to verify the correct patient, procedure, equipment, support staff and site/side marked as required. Patient was prepped and draped in the usual sterile fashion.      Clinical Data: No additional  findings.   Subjective: Chief Complaint  Patient presents with   Right Knee - Pain  The patient comes in today for evaluation and treatment of right knee pain.  It hurts on the medial aspect of her knee is where she points to.  Actually replaced her left knee in 2018 and that has done well.  The right knee has been hurting her for the last several months and does wake her up at night.  Some weightbearing activities and pivoting activities also cause some pain.  She has been working with a trainer to get her quad strong.  She did show me some of the exercises that she does and I think those are all appropriate for her knee.  She has not tried Voltaren gel but she does have some.  She is not a diabetic.  She denies any other injuries to that knee.  HPI  Review of Systems There is no listed fever, chills, nausea, vomiting  Objective: Vital Signs: Ht 5\' 5"  (1.651 m)    Wt 240 lb (108.9 kg)    BMI 39.94 kg/m   Physical Exam She is alert and orient x3 and in no acute distress Ortho Exam Examination of her right knee does show significant medial joint line tenderness and patellofemoral crepitation.  The knee has slight varus malalignment and no ligamentous instability. Specialty  Comments:  No specialty comments available.  Imaging: XR Knee 1-2 Views Right  Result Date: 01/14/2022 2 views of the right knee show tricompartment arthritis with medial joint space narrowing and patellofemoral narrowing.  There are osteophytes in all 3 compartments.    PMFS History: Patient Active Problem List   Diagnosis Date Noted   Unilateral primary osteoarthritis, right knee 01/14/2022   Anemia 04/26/2020   RBBB 04/26/2020   Diabetes ([place]) 04/26/2020   QT prolongation 04/26/2020   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/2017   Closed nondisplaced fracture of head of right radius with routine healing 07/10/2017   Chronic pain of left knee 07/10/2017   Acute pain of left knee 07/10/2017    Unilateral primary osteoarthritis, left knee 07/10/2017   Closed nondisplaced fracture of head of right radius 06/09/2017   Past Medical History:  Diagnosis Date   Anemia 1992   Arthritis    oa   Breast CA ([place]) 1992   lumpectomy with chemo and radiation left breast   Chronic kidney disease    watching creatining levels sees dr nwbu   Diabetes mellitus without complication ([place])    type 2    GERD (gastroesophageal reflux disease)    Hiatal hernia    High cholesterol    Hypertension    Hypothyroidism    Personal history of radiation therapy    Sleep apnea     Family History  Problem Relation Age of Onset   Breast cancer Mother 80    Past Surgical History:  Procedure Laterality Date   BACK SURGERY     upper and lower upper back has clamp   BREAST LUMPECTOMY Left    CHOLECYSTECTOMY     TONSILLECTOMY     TOTAL KNEE ARTHROPLASTY Left 09/12/2017   Procedure: LEFT TOTAL KNEE ARTHROPLASTY;  Surgeon: Mcarthur Rossetti, MD;  Location: [facility];  Service: Orthopedics;  Laterality: Left;   TUBAL LIGATION     Social History   Occupational History   Not on file  Tobacco Use   Smoking status: Never   Smokeless tobacco: Never  Vaping Use   Vaping Use: Never used  Substance and Sexual Activity   Alcohol use: Yes    Comment: rare   Drug use: No   Sexual activity: Not on file

## 2022-01-29 HISTORY — PX: CARDIAC CATHETERIZATION: SHX172

## 2022-02-11 ENCOUNTER — Ambulatory Visit (INDEPENDENT_AMBULATORY_CARE_PROVIDER_SITE_OTHER): Payer: Medicare Other

## 2022-02-11 ENCOUNTER — Ambulatory Visit (INDEPENDENT_AMBULATORY_CARE_PROVIDER_SITE_OTHER): Payer: Medicare Other | Admitting: Orthopaedic Surgery

## 2022-02-11 ENCOUNTER — Encounter: Payer: Self-pay | Admitting: Orthopaedic Surgery

## 2022-02-11 DIAGNOSIS — M1711 Unilateral primary osteoarthritis, right knee: Secondary | ICD-10-CM | POA: Diagnosis not present

## 2022-02-11 DIAGNOSIS — M25512 Pain in left shoulder: Secondary | ICD-10-CM | POA: Diagnosis not present

## 2022-02-11 DIAGNOSIS — M25561 Pain in right knee: Secondary | ICD-10-CM | POA: Diagnosis not present

## 2022-02-11 DIAGNOSIS — G8929 Other chronic pain: Secondary | ICD-10-CM

## 2022-02-11 MED ORDER — METHYLPREDNISOLONE ACETATE 40 MG/ML IJ SUSP
40.0000 mg | INTRAMUSCULAR | Status: AC | PRN
  Administered 2022-02-11: 40 mg via INTRA_ARTICULAR

## 2022-02-11 MED ORDER — LIDOCAINE HCL 1 % IJ SOLN
3.0000 mL | INTRAMUSCULAR | Status: AC | PRN
  Administered 2022-02-11: 3 mL

## 2022-02-11 NOTE — Progress Notes (Signed)
? ?Office Visit Note ?  ?Patient: Kristin [name]           ?Date of Birth: 1946-03-31           ?MRN: [id number] ?Visit Date: 02/11/2022 ?             ?Requested by: Javier Glazier, MD ?[address] ?[place],  [place] [address] ?PCP: Javier Glazier, MD ? ? ?Assessment & Plan: ?Visit Diagnoses:  ?1. Chronic pain of right knee   ?2. Unilateral primary osteoarthritis, right knee   ?3. Acute pain of left shoulder   ? ? ?Plan: Based on the acute nature of her pain, I did recommend a steroid injection in her left shoulder subacromial outlet this afternoon which she tolerated well.  She will try Voltaren gel.  She will limit her anti-inflammatories due to chronic renal insufficiency.  I would like to see her back in 3 weeks for repeat exam.  All questions and concerns were answered and addressed. ? ?Follow-Up Instructions: Return in about 3 weeks (around 03/04/2022).  ? ?Orders:  ?Orders Placed This Encounter  ?Procedures  ? Large Joint Inj  ? XR Shoulder Left  ? ?No orders of the defined types were placed in this encounter. ? ? ? ? Procedures: ?Large Joint Inj: L subacromial bursa on 02/11/2022 1:22 PM ?Indications: pain and diagnostic evaluation ?Details: 22 G 1.5 in needle ? ?Arthrogram: No ? ?Medications: 3 mL lidocaine 1 %; 40 mg methylPREDNISolone acetate 40 MG/ML ?Outcome: tolerated well, no immediate complications ?Procedure, treatment alternatives, risks and benefits explained, specific risks discussed. Consent was given by the patient. Immediately prior to procedure a time out was called to verify the correct patient, procedure, equipment, support staff and site/side marked as required. Patient was prepped and draped in the usual sterile fashion.  ? ? ? ? ?Clinical Data: ?No additional findings. ? ? ?Subjective: ?Chief Complaint  ?Patient presents with  ? Right Knee - Follow-up  ? Left Shoulder - Pain  ?The patient is well-known to me.  She is a 76 year old female who we replaced her left knee.  Recently we  saw her for right knee and placed a steroid injection in her right knee and she said that is really done well and has been helpful so she would like to hold off on any hyaluronic acid.  She does have moderate arthritis in the right knee.  However, she has been dealing with acute left shoulder pain.  She cannot raise her left arm above her head and had minimal trauma to that shoulder last week when she barely tripped and jarred her arm propping yourself against her car.  She is left-hand dominant and this was her left shoulder that she injured.  She felt like the injury was minimal but now she is having a significant hard time reaching above her head and behind her and performing activities day living due to her left shoulder issues.  She denies previous shoulder issues with the left shoulder. ? ?HPI ? ?Review of Systems ?There is currently listed no fever, chills, nausea, vomiting ? ?Objective: ?Vital Signs: There were no vitals taken for this visit. ? ?Physical Exam ?She is alert and orient x3 and in no acute distress ?Ortho Exam ?Examination her left shoulder shows severe pain with attempts of abduction and external rotation.  There is weakness in her shoulder as well.  It is clinically well located. ?Specialty Comments:  ?No specialty comments available. ? ?Imaging: ?XR Shoulder Left ? ?  Result Date: 02/11/2022 ?3 views of the left shoulder show well located shoulder.  There are calcifications noted in the subacromial outlet near the insertion of the rotator cuff.  There is also narrowing of the AC joint.  The glenohumeral joint is well-maintained.  ? ? ?PMFS History: ?Patient Active Problem List  ? Diagnosis Date Noted  ? Unilateral primary osteoarthritis, right knee 01/14/2022  ? Anemia 04/26/2020  ? RBBB 04/26/2020  ? Diabetes ([place]) 04/26/2020  ? QT prolongation 04/26/2020  ? 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 Closed nondisplaced fracture of head of right radius with routine healing 07/10/2017   ? Chronic pain of left knee 07/10/2017  ? Acute pain of left knee 07/10/2017  ? Unilateral primary osteoarthritis, left knee 07/10/2017  ? Closed nondisplaced fracture of head of right radius 06/09/2017  ? ?Past Medical History:  ?Diagnosis Date  ? Anemia 1992  ? Arthritis   ? oa  ? Breast CA ([place]) 1992  ? lumpectomy with chemo and radiation left breast  ? Chronic kidney disease   ? watching creatining levels sees dr nwbu  ? Diabetes mellitus without complication ([place])   ? type 2   ? GERD (gastroesophageal reflux disease)   ? Hiatal hernia   ? High cholesterol   ? Hypertension   ? Hypothyroidism   ? Personal history of radiation therapy   ? Sleep apnea   ?  ?Family History  ?Problem Relation Age of Onset  ? Breast cancer Mother 44  ?  ?Past Surgical History:  ?Procedure Laterality Date  ? BACK SURGERY    ? upper and lower upper back has clamp  ? BREAST LUMPECTOMY Left   ? CHOLECYSTECTOMY    ? TONSILLECTOMY    ? TOTAL KNEE ARTHROPLASTY Left 09/12/2017  ? Procedure: LEFT TOTAL KNEE ARTHROPLASTY;  Surgeon: Mcarthur Rossetti, MD;  Location: [facility];  Service: Orthopedics;  Laterality: Left;  ? TUBAL LIGATION    ? ?Social History  ? ?Occupational History  ? Not on file  ?Tobacco Use  ? Smoking status: Never  ? Smokeless tobacco: Never  ?Vaping Use  ? Vaping Use: Never used  ?Substance and Sexual Activity  ? Alcohol use: Yes  ?  Comment: rare  ? Drug use: No  ? Sexual activity: Not on file  ? ? ? ? ? ? ?

## 2022-03-04 ENCOUNTER — Other Ambulatory Visit: Payer: Self-pay

## 2022-03-04 ENCOUNTER — Encounter: Payer: Self-pay | Admitting: Orthopaedic Surgery

## 2022-03-04 ENCOUNTER — Ambulatory Visit (INDEPENDENT_AMBULATORY_CARE_PROVIDER_SITE_OTHER): Payer: Medicare Other | Admitting: Orthopaedic Surgery

## 2022-03-04 DIAGNOSIS — M25512 Pain in left shoulder: Secondary | ICD-10-CM | POA: Diagnosis not present

## 2022-03-04 NOTE — Progress Notes (Signed)
The patient does have some improvement in her left shoulder in terms of decreased pain and improved function however is still weak for her with her left shoulder.  Her and her husband to stay at [facility].  There is outpatient physical therapy there.  I have at least tried 1 steroid injection in the subacromial outlet which did help some. ? ?On exam she still hurts quite a bit past 90 degrees of abduction.  The shoulder still weak on the left side. ? ?I would like to to have [facility] try outpatient physical therapy on her left shoulder with any modalities that can improve her shoulder function and strength as well as range of motion and decrease her pain.  She agrees with this treatment plan.  She is 76 years old and very active.  We will reevaluate her in 6 weeks.  All questions and concerns were answered and addressed.  Of note her right knee is still doing well. ?

## 2022-04-17 ENCOUNTER — Ambulatory Visit: Payer: Medicare Other | Admitting: Orthopaedic Surgery

## 2022-04-22 ENCOUNTER — Encounter: Payer: Self-pay | Admitting: Orthopaedic Surgery

## 2022-04-22 ENCOUNTER — Ambulatory Visit (INDEPENDENT_AMBULATORY_CARE_PROVIDER_SITE_OTHER): Payer: Medicare Other | Admitting: Orthopaedic Surgery

## 2022-04-22 ENCOUNTER — Telehealth: Payer: Self-pay

## 2022-04-22 DIAGNOSIS — M25512 Pain in left shoulder: Secondary | ICD-10-CM | POA: Diagnosis not present

## 2022-04-22 DIAGNOSIS — M1711 Unilateral primary osteoarthritis, right knee: Secondary | ICD-10-CM | POA: Diagnosis not present

## 2022-04-22 NOTE — Telephone Encounter (Signed)
Right knee gel injection  

## 2022-04-22 NOTE — Progress Notes (Signed)
The patient is here today for 2 different issues.  I have seen her for chronic right knee pain for some time and known and well-documented arthritis in her right knee.  She has never had a hyaluronic acid injection in that right knee but has had steroid injections.  It is gotten to where she has failed those and wishes for the next step which would be hyaluronic acid to treat the pain from osteoarthritis.  We did see her for acute shoulder pain at her last visit and placed a steroid injection in the left shoulder subacromial outlet.  She is going to outpatient physical therapy for the shoulder and she said that is helped quite a bit but it has caused her to have some left elbow pain and she points to the lateral epicondyle area of the elbow as a source of her pain.  She has been using resistance bands.  She has tried Biofreeze on this area as well as heat and that is helped some. ? ?Her right knee does show patellofemoral crepitation with varus malalignment and medial joint line tenderness throughout the arc of motion of the knee.  Her left shoulder is moving better overall and still has some posterior pain just off the subacromial outlet.  The left elbow does have pain over the lateral epicondyle. ? ?I showed her stretching exercises for her elbow as it relates to the tennis elbow.  She can try Voltaren gel in this area.  I told her to avoid repetitive activities with her hand in a pronated position.  We will order hyaluronic acid for the right knee and I can certainly take a look at her elbow at that visit again as well as her shoulder as needed.  All question concerns were answered and addressed. ?

## 2022-04-23 NOTE — Telephone Encounter (Signed)
Noted  

## 2022-08-25 ENCOUNTER — Encounter (HOSPITAL_BASED_OUTPATIENT_CLINIC_OR_DEPARTMENT_OTHER): Payer: Self-pay

## 2022-08-25 ENCOUNTER — Observation Stay (HOSPITAL_COMMUNITY): Payer: Medicare Other

## 2022-08-25 ENCOUNTER — Encounter (HOSPITAL_COMMUNITY): Payer: Self-pay

## 2022-08-25 ENCOUNTER — Inpatient Hospital Stay (HOSPITAL_BASED_OUTPATIENT_CLINIC_OR_DEPARTMENT_OTHER)
Admission: EM | Admit: 2022-08-25 | Discharge: 2022-08-27 | DRG: 683 | Disposition: A | Discharge status: Home / Self Care | Payer: Medicare Other | Attending: Internal Medicine | Admitting: Internal Medicine

## 2022-08-25 ENCOUNTER — Other Ambulatory Visit: Payer: Self-pay

## 2022-08-25 ENCOUNTER — Emergency Department (HOSPITAL_BASED_OUTPATIENT_CLINIC_OR_DEPARTMENT_OTHER): Payer: Medicare Other

## 2022-08-25 DIAGNOSIS — E7800 Pure hypercholesterolemia, unspecified: Secondary | ICD-10-CM | POA: Diagnosis not present

## 2022-08-25 DIAGNOSIS — E860 Dehydration: Secondary | ICD-10-CM | POA: Diagnosis present

## 2022-08-25 DIAGNOSIS — Z853 Personal history of malignant neoplasm of breast: Secondary | ICD-10-CM

## 2022-08-25 DIAGNOSIS — Z79899 Other long term (current) drug therapy: Secondary | ICD-10-CM

## 2022-08-25 DIAGNOSIS — Z79890 Hormone replacement therapy (postmenopausal): Secondary | ICD-10-CM

## 2022-08-25 DIAGNOSIS — Z6841 Body Mass Index (BMI) 40.0 and over, adult: Secondary | ICD-10-CM | POA: Diagnosis not present

## 2022-08-25 DIAGNOSIS — A084 Viral intestinal infection, unspecified: Secondary | ICD-10-CM | POA: Diagnosis present

## 2022-08-25 DIAGNOSIS — Z923 Personal history of irradiation: Secondary | ICD-10-CM | POA: Diagnosis not present

## 2022-08-25 DIAGNOSIS — N179 Acute kidney failure, unspecified: Secondary | ICD-10-CM | POA: Diagnosis not present

## 2022-08-25 DIAGNOSIS — F32A Depression, unspecified: Secondary | ICD-10-CM | POA: Diagnosis not present

## 2022-08-25 DIAGNOSIS — R112 Nausea with vomiting, unspecified: Secondary | ICD-10-CM

## 2022-08-25 DIAGNOSIS — I10 Essential (primary) hypertension: Secondary | ICD-10-CM

## 2022-08-25 DIAGNOSIS — K649 Unspecified hemorrhoids: Secondary | ICD-10-CM | POA: Diagnosis not present

## 2022-08-25 DIAGNOSIS — K219 Gastro-esophageal reflux disease without esophagitis: Secondary | ICD-10-CM | POA: Diagnosis not present

## 2022-08-25 DIAGNOSIS — Z888 Allergy status to other drugs, medicaments and biological substances status: Secondary | ICD-10-CM

## 2022-08-25 DIAGNOSIS — Z885 Allergy status to narcotic agent status: Secondary | ICD-10-CM

## 2022-08-25 DIAGNOSIS — E039 Hypothyroidism, unspecified: Secondary | ICD-10-CM | POA: Diagnosis not present

## 2022-08-25 DIAGNOSIS — Z7984 Long term (current) use of oral hypoglycemic drugs: Secondary | ICD-10-CM | POA: Diagnosis not present

## 2022-08-25 DIAGNOSIS — G4733 Obstructive sleep apnea (adult) (pediatric): Secondary | ICD-10-CM

## 2022-08-25 DIAGNOSIS — K8050 Calculus of bile duct without cholangitis or cholecystitis without obstruction: Secondary | ICD-10-CM

## 2022-08-25 DIAGNOSIS — F419 Anxiety disorder, unspecified: Secondary | ICD-10-CM | POA: Diagnosis not present

## 2022-08-25 DIAGNOSIS — E1122 Type 2 diabetes mellitus with diabetic chronic kidney disease: Secondary | ICD-10-CM | POA: Diagnosis not present

## 2022-08-25 DIAGNOSIS — N1832 Chronic kidney disease, stage 3b: Secondary | ICD-10-CM | POA: Diagnosis not present

## 2022-08-25 DIAGNOSIS — E119 Type 2 diabetes mellitus without complications: Secondary | ICD-10-CM

## 2022-08-25 DIAGNOSIS — I129 Hypertensive chronic kidney disease with stage 1 through stage 4 chronic kidney disease, or unspecified chronic kidney disease: Secondary | ICD-10-CM | POA: Diagnosis present

## 2022-08-25 DIAGNOSIS — Z96652 Presence of left artificial knee joint: Secondary | ICD-10-CM | POA: Diagnosis not present

## 2022-08-25 DIAGNOSIS — E785 Hyperlipidemia, unspecified: Secondary | ICD-10-CM

## 2022-08-25 DIAGNOSIS — Z803 Family history of malignant neoplasm of breast: Secondary | ICD-10-CM

## 2022-08-25 DIAGNOSIS — R197 Diarrhea, unspecified: Secondary | ICD-10-CM

## 2022-08-25 LAB — CBC WITH DIFFERENTIAL/PLATELET
Abs Immature Granulocytes: 0.06 10*3/uL (ref 0.00–0.07)
Basophils Absolute: 0 10*3/uL (ref 0.0–0.1)
Basophils Relative: 0 %
Eosinophils Absolute: 0.1 10*3/uL (ref 0.0–0.5)
Eosinophils Relative: 1 %
HCT: 44 % (ref 36.0–46.0)
Hemoglobin: 15.1 g/dL — ABNORMAL HIGH (ref 12.0–15.0)
Immature Granulocytes: 0 %
Lymphocytes Relative: 6 %
Lymphs Abs: 0.8 10*3/uL (ref 0.7–4.0)
MCH: 33.5 pg (ref 26.0–34.0)
MCHC: 34.3 g/dL (ref 30.0–36.0)
MCV: 97.6 fL (ref 80.0–100.0)
Monocytes Absolute: 0.5 10*3/uL (ref 0.1–1.0)
Monocytes Relative: 4 %
Neutro Abs: 12.7 10*3/uL — ABNORMAL HIGH (ref 1.7–7.7)
Neutrophils Relative %: 89 %
Platelets: 253 10*3/uL (ref 150–400)
RBC: 4.51 MIL/uL (ref 3.87–5.11)
RDW: 12.9 % (ref 11.5–15.5)
WBC: 14.2 10*3/uL — ABNORMAL HIGH (ref 4.0–10.5)
nRBC: 0 % (ref 0.0–0.2)

## 2022-08-25 LAB — COMPREHENSIVE METABOLIC PANEL
ALT: 20 U/L (ref 0–44)
AST: 22 U/L (ref 15–41)
Albumin: 4.2 g/dL (ref 3.5–5.0)
Alkaline Phosphatase: 91 U/L (ref 38–126)
Anion gap: 11 (ref 5–15)
BUN: 44 mg/dL — ABNORMAL HIGH (ref 8–23)
CO2: 21 mmol/L — ABNORMAL LOW (ref 22–32)
Calcium: 9.9 mg/dL (ref 8.9–10.3)
Chloride: 103 mmol/L (ref 98–111)
Creatinine, Ser: 2.56 mg/dL — ABNORMAL HIGH (ref 0.44–1.00)
GFR, Estimated: 19 mL/min — ABNORMAL LOW (ref >60)
Glucose, Bld: 192 mg/dL — ABNORMAL HIGH (ref 70–99)
Potassium: 3.9 mmol/L (ref 3.5–5.1)
Sodium: 135 mmol/L (ref 135–145)
Total Bilirubin: 0.7 mg/dL (ref 0.3–1.2)
Total Protein: 7.2 g/dL (ref 6.5–8.1)

## 2022-08-25 LAB — MAGNESIUM: Magnesium: 1.8 mg/dL (ref 1.7–2.4)

## 2022-08-25 LAB — TROPONIN I (HIGH SENSITIVITY)
Troponin I (High Sensitivity): 6 ng/L (ref <18)
Troponin I (High Sensitivity): 5 ng/L (ref <18)

## 2022-08-25 LAB — GLUCOSE, CAPILLARY
Glucose-Capillary: 106 mg/dL — ABNORMAL HIGH (ref 70–99)
Glucose-Capillary: 89 mg/dL (ref 70–99)
Glucose-Capillary: 109 mg/dL — ABNORMAL HIGH (ref 70–99)

## 2022-08-25 MED ORDER — GABAPENTIN 300 MG PO CAPS
600.0000 mg | ORAL_CAPSULE | Freq: Every day | ORAL | Status: DC
  Administered 2022-08-25 – 2022-08-26 (×2): 600 mg via ORAL
  Filled 2022-08-25 (×2): qty 2

## 2022-08-25 MED ORDER — LACTATED RINGERS IV BOLUS
1000.0000 mL | Freq: Once | INTRAVENOUS | Status: AC
  Administered 2022-08-25: 1000 mL via INTRAVENOUS

## 2022-08-25 MED ORDER — ACETAMINOPHEN 325 MG PO TABS: 650.0000 mg | ORAL_TABLET | Freq: Four times a day (QID) | ORAL | Status: DC | PRN

## 2022-08-25 MED ORDER — ACETAMINOPHEN 650 MG RE SUPP: 650.0000 mg | Freq: Four times a day (QID) | RECTAL | Status: DC | PRN

## 2022-08-25 MED ORDER — GERHARDT'S BUTT CREAM
TOPICAL_CREAM | Freq: Two times a day (BID) | CUTANEOUS | Status: DC
  Filled 2022-08-25: qty 1

## 2022-08-25 MED ORDER — PROCHLORPERAZINE EDISYLATE 10 MG/2ML IJ SOLN: 10.0000 mg | Freq: Four times a day (QID) | INTRAMUSCULAR | Status: DC | PRN

## 2022-08-25 MED ORDER — HYDROCORTISONE (PERIANAL) 2.5 % EX CREA
TOPICAL_CREAM | Freq: Two times a day (BID) | CUTANEOUS | Status: DC
  Filled 2022-08-25: qty 28.35

## 2022-08-25 MED ORDER — INSULIN ASPART 100 UNIT/ML IJ SOLN: 0.0000 [IU] | Freq: Every day | INTRAMUSCULAR | Status: DC

## 2022-08-25 MED ORDER — INSULIN ASPART 100 UNIT/ML IJ SOLN: 0.0000 [IU] | Freq: Three times a day (TID) | INTRAMUSCULAR | Status: DC

## 2022-08-25 MED ORDER — SODIUM CHLORIDE 0.9 % IV SOLN: INTRAVENOUS | Status: DC

## 2022-08-25 MED ORDER — ZOLPIDEM TARTRATE 5 MG PO TABS
5.0000 mg | ORAL_TABLET | Freq: Every evening | ORAL | Status: DC | PRN
  Administered 2022-08-25 – 2022-08-26 (×2): 5 mg via ORAL
  Filled 2022-08-25 (×2): qty 1

## 2022-08-25 NOTE — ED Provider Notes (Signed)
[facility] EMERGENCY DEPARTMENT Provider Note   CSN: [id number] Arrival date & time: 08/25/22  0542     History  Chief Complaint  Patient presents with   Emesis   Diarrhea    Kristin [name] is a 76 y.o. female.  The history is provided by the patient and medical records.  Emesis Associated symptoms: diarrhea   Diarrhea Associated symptoms: vomiting   Kristin [name] is a 76 y.o. female who presents to the Emergency Department complaining of diarrhea.  She presents to the emergency department accompanied by her husband for evaluation of diarrhea.  Symptoms started on Friday night with diarrhea and severe indigestion.  She took loperamide with improvement in her symptoms overnight.  On Saturday she had persistent diarrhea and took a second dose of loperamide.  She did try to eat dinner last night but started to feel significantly worse with severe nausea.  She took an ondansetron but did have emesis at 2 AM.  Throughout the night she has been experiencing profuse watery diarrhea with BMs about every 20 minutes.  In the night she developed epigastric/chest pressure.  Now she feels generally weak.  She does report occasional belching.    No fever.  No sob.  No dysuria.  No leg swelling.    Hx/o DM, CKD 3, breast cancer in remission, HPL, HTN.    No known sick contacts.  No known bad food exposures.    Was in ICU in [place] in December when on a cruise. Had diarrhea and vomiting at that time.       Home Medications Prior to Admission medications   Medication Sig Start Date End Date Taking? Authorizing Provider  tirzepatide [facility] 12.5 MG/0.5ML Pen Inject 12.5 mg into the skin once a week.   Yes [provider]  amLODipine (NORVASC) 5 MG tablet Take 5 mg by mouth 2 (two) times daily.     [provider]  atorvastatin (LIPITOR) 40 MG tablet Take 40 mg by mouth every evening.     [provider]  buPROPion (WELLBUTRIN XL) 150 MG 24 hr  tablet Take 150 mg by mouth every other day.  02/17/20   [provider]  Calcium-Magnesium-Vitamin D (CALCIUM 1200+D3 PO) Take 1 tablet by mouth daily.    [provider]  cyanocobalamin (,VITAMIN B-12,) 1000 MCG/ML injection Inject 1,000 mcg into the muscle every 30 (thirty) days.  03/29/20   [provider]  escitalopram (LEXAPRO) 20 MG tablet Take 20 mg by mouth daily. 02/17/20   [provider]  ferrous sulfate 325 (65 FE) MG tablet Take 325 mg by mouth at bedtime.    [provider]  fluticasone (FLONASE) 50 MCG/ACT nasal spray Place 1 spray into both nostrils at bedtime.     [provider]  furosemide (LASIX) 20 MG tablet Take 10 mg by mouth daily as needed for edema.    [provider]  gabapentin (NEURONTIN) 300 MG capsule Take 600 mg by mouth at bedtime. 04/06/20   [provider]  hydrALAZINE (APRESOLINE) 25 MG tablet Take 25 mg by mouth in the morning and at bedtime.  12/25/18   [provider]  lisinopril (ZESTRIL) 20 MG tablet Take 20 mg by mouth 2 (two) times daily.     [provider]  MAGNESIUM GLYCINATE PO Take 600 mg by mouth 2 (two) times daily.    [provider]  metFORMIN (GLUCOPHAGE) 500 MG tablet Take 1,000 mg by mouth 2 (two)  times daily with a meal.    [provider]  metoprolol (LOPRESSOR) 50 MG tablet Take 50 mg by mouth 2 (two) times daily.    [provider]  Multiple Vitamins-Minerals (MULTIVITAMIN ADULT PO) Take 1 tablet by mouth daily.     [provider]  niacin (SLO-NIACIN) 500 MG tablet Take 500 mg by mouth 2 (two) times daily. 12/28/19   [provider]  Omega-3 Fatty Acids (FISH OIL) 1200 MG CAPS Take 1,200 mg by mouth at bedtime.     [provider]  pantoprazole (PROTONIX) 40 MG tablet Take 1 tablet (40 mg total) by mouth at bedtime. 04/27/20   Cherene Altes, MD  sitaGLIPtin (JANUVIA) 25 MG tablet Take 25 mg by mouth  at bedtime.     [provider]  SYNTHROID 75 MCG tablet Take 75 mcg by mouth daily. 04/07/20   [provider]  zolpidem (AMBIEN CR) 12.5 MG CR tablet Take 12.5 mg by mouth at bedtime.  01/06/19   [provider]      Allergies    Ibuprofen, Levodopa, and Oxycodone    Review of Systems   Review of Systems  Gastrointestinal:  Positive for diarrhea and vomiting.  All other systems reviewed and are negative.   Physical Exam Updated Vital Signs BP 117/64 (BP Location: Right Arm)   Pulse (!) 105   Temp 98.4 F (36.9 C) (Oral)   Resp 17   Ht '5\' 4"'$  (1.626 m)   Wt 108.6 kg   SpO2 95%   BMI 41.09 kg/m  Physical Exam Vitals and nursing note reviewed.  Constitutional:      Appearance: She is well-developed.  HENT:     Head: Normocephalic and atraumatic.  Cardiovascular:     Rate and Rhythm: Regular rhythm. Tachycardia present.  Pulmonary:     Effort: Pulmonary effort is normal. No respiratory distress.  Abdominal:     Palpations: Abdomen is soft.     Tenderness: There is no guarding or rebound.     Comments: Mild to moderate epigastric tenderness  Musculoskeletal:        General: No swelling or tenderness.  Skin:    General: Skin is warm and dry.  Neurological:     Mental Status: She is alert and oriented to person, place, and time.  Psychiatric:        Behavior: Behavior normal.     ED Results / Procedures / Treatments   Labs (all labs ordered are listed, but only abnormal results are displayed) Labs Reviewed - No data to display  EKG None  Radiology No results found.  Procedures Procedures    Medications Ordered in ED Medications - No data to display  ED Course/ Medical Decision Making/ A&P                           Medical Decision Making Amount and/or Complexity of Data Reviewed Labs: ordered. Radiology: ordered.   Patient with history of diabetes, hypertension, stage III CKD here for evaluation of abdominal pain,  nausea, vomiting, diarrhea.  She is dehydrated appearing on evaluation.  We will treat with IV fluids, antiemetic.  Patient care transferred pending labs and reassessment.        Final Clinical Impression(s) / ED Diagnoses Final diagnoses:  None    Rx / DC Orders ED Discharge Orders     None         Quintella Reichert, MD 08/25/22  0741  

## 2022-08-25 NOTE — H&P (Signed)
History and Physical    Patient: Kristin [name] DOB: 14-Oct-1946 DOA: 08/25/2022 DOS: the patient was seen and examined on 08/25/2022 PCP: Pcp, No  Patient coming from: Home  Chief Complaint:  Chief Complaint  Patient presents with   Emesis   Diarrhea   HPI: Kristin [name] is a 76 y.o. female with medical history significant of DM2, CKD4, hypothyroidism, HTN, HLD. Presenting with N/V/D. She reports her symptoms started 2 days ago. She didn't have any abdomen pain. She noticed that she was having diarrhea and some indigestion. She took some loperamide and pepcid, but they didn't seem to help. Her diarrhea worsened over the next day. She then began to have nausea and an episode of vomiting. She tried some zofran for the nausea.  She didn't have any fevers, sick contacts, recent abx use. She didn't have any recent exotic meals. She reports that her diarrhea became more watery and she started developing fatigue this morning. So she decided to come to the ED for evaluation. She denies any other aggravating or alleviating factors.   Review of Systems: As mentioned in the history of present illness. All other systems reviewed and are negative. Past Medical History:  Diagnosis Date   Anemia 1992   Arthritis    oa   Breast CA ([place]) 1992   lumpectomy with chemo and radiation left breast   Chronic kidney disease stage 3    watching creatining levels sees dr nwbu   Diabetes mellitus without complication ([place])    type 2    GERD (gastroesophageal reflux disease)    Hiatal hernia    High cholesterol    Hypertension    Hypothyroidism    Personal history of radiation therapy    Sleep apnea    Past Surgical History:  Procedure Laterality Date   BACK SURGERY     upper and lower upper back has clamp   BREAST LUMPECTOMY Left    CHOLECYSTECTOMY     TONSILLECTOMY     TOTAL KNEE ARTHROPLASTY Left 09/12/2017   Procedure: LEFT TOTAL KNEE ARTHROPLASTY;  Surgeon: Mcarthur Rossetti, MD;  Location: [facility];  Service: Orthopedics;  Laterality: Left;   TUBAL LIGATION     Social History:  reports that she has never smoked. She has never used smokeless tobacco. She reports that she does not currently use alcohol. She reports that she does not use drugs.  Allergies  Allergen Reactions   Ibuprofen     Does not take due to kidney function    Levodopa Other (See Comments)    Increase of dopamine levels   Oxycodone     Pt cannot tolerate oxycodone- makes her "loopy"/ AMS    Family History  Problem Relation Age of Onset   Breast cancer Mother 44    Prior to Admission medications   Medication Sig Start Date End Date Taking? Authorizing Provider  tirzepatide [facility]) 12.5 MG/0.5ML Pen Inject 12.5 mg into the skin once a week.   Yes [provider]  amLODipine (NORVASC) 5 MG tablet Take 5 mg by mouth 2 (two) times daily.     [provider]  atorvastatin (LIPITOR) 40 MG tablet Take 40 mg by mouth every evening.     [provider]  buPROPion (WELLBUTRIN XL) 150 MG 24 hr tablet Take 150 mg by mouth every other day.  02/17/20   [provider]  Calcium-Magnesium-Vitamin D (CALCIUM 1200+D3 PO) Take 1 tablet by mouth daily.    [provider]  cyanocobalamin (,VITAMIN B-12,) 1000 MCG/ML injection Inject 1,000 mcg into the muscle every 30 (thirty) days.  03/29/20   [provider]  escitalopram (LEXAPRO) 20 MG tablet Take 20 mg by mouth daily. 02/17/20   [provider]  ferrous sulfate 325 (65 FE) MG tablet Take 325 mg by mouth at bedtime.    [provider]  fluticasone (FLONASE) 50 MCG/ACT nasal spray Place 1 spray into both nostrils at bedtime.     [provider]  furosemide (LASIX) 20 MG tablet Take 10 mg by mouth daily as needed for edema.    [provider]  gabapentin (NEURONTIN) 300 MG capsule Take 600 mg by mouth at bedtime. 04/06/20   [provider]   hydrALAZINE (APRESOLINE) 25 MG tablet Take 25 mg by mouth in the morning and at bedtime.  12/25/18   [provider]  lisinopril (ZESTRIL) 20 MG tablet Take 20 mg by mouth 2 (two) times daily.     [provider]  MAGNESIUM GLYCINATE PO Take 600 mg by mouth 2 (two) times daily.    [provider]  metFORMIN (GLUCOPHAGE) 500 MG tablet Take 1,000 mg by mouth 2 (two) times daily with a meal.    [provider]  metoprolol (LOPRESSOR) 50 MG tablet Take 50 mg by mouth 2 (two) times daily.    [provider]  Multiple Vitamins-Minerals (MULTIVITAMIN ADULT PO) Take 1 tablet by mouth daily.     [provider]  niacin (SLO-NIACIN) 500 MG tablet Take 500 mg by mouth 2 (two) times daily. 12/28/19   [provider]  Omega-3 Fatty Acids (FISH OIL) 1200 MG CAPS Take 1,200 mg by mouth at bedtime.     [provider]  pantoprazole (PROTONIX) 40 MG tablet Take 1 tablet (40 mg total) by mouth at bedtime. 04/27/20   Cherene Altes, MD  sitaGLIPtin (JANUVIA) 25 MG tablet Take 25 mg by mouth at bedtime.     [provider]  SYNTHROID 75 MCG tablet Take 75 mcg by mouth daily. 04/07/20   [provider]  zolpidem (AMBIEN CR) 12.5 MG CR tablet Take 12.5 mg by mouth at bedtime.  01/06/19   [provider]    Physical Exam: Vitals:   08/25/22 0800 08/25/22 0900 08/25/22 1130 08/25/22 1256  BP: (!) 132/99 129/66 135/68 (!) 98/53  Pulse: 95 84 95 92  Resp: '16 20 18 '$ (!) 22  Temp:  98.1 F (36.7 C) 98 F (36.7 C) 98 F (36.7 C)  TempSrc:  Oral  Oral  SpO2: 98% 96% 95% 98%  Weight:      Height:       General: 76 y.o. female resting in bed in NAD Eyes: PERRL, normal sclera ENMT: Nares patent w/o discharge, orophaynx clear, dentition normal, ears w/o discharge/lesions/ulcers Neck: Supple, trachea midline Cardiovascular: RRR, +S1, S2, no m/g/r, equal pulses throughout Respiratory: CTABL, no w/r/r, normal WOB GI:  BS+, NDNT, no masses noted, no organomegaly noted MSK: No e/c/c Neuro: A&O x 3, no focal deficits Psyc: Appropriate interaction and affect, calm/cooperative  Data Reviewed:  Results for orders placed or performed during the hospital encounter of 08/25/22 (from the past 24 hour(s))  Comprehensive metabolic panel     Status: Abnormal   Collection Time: 08/25/22  6:28 AM  Result Value Ref Range   Sodium 135 135 - 145 mmol/L   Potassium 3.9 3.5 - 5.1 mmol/L   Chloride 103 98 - 111 mmol/L  CO2 21 (L) 22 - 32 mmol/L   Glucose, Bld 192 (H) 70 - 99 mg/dL   BUN 44 (H) 8 - 23 mg/dL   Creatinine, Ser 2.56 (H) 0.44 - 1.00 mg/dL   Calcium 9.9 8.9 - 10.3 mg/dL   Total Protein 7.2 6.5 - 8.1 g/dL   Albumin 4.2 3.5 - 5.0 g/dL   AST 22 15 - 41 U/L   ALT 20 0 - 44 U/L   Alkaline Phosphatase 91 38 - 126 U/L   Total Bilirubin 0.7 0.3 - 1.2 mg/dL   GFR, Estimated 19 (L) >60 mL/min   Anion gap 11 5 - 15  Troponin I (High Sensitivity)     Status: None   Collection Time: 08/25/22  6:28 AM  Result Value Ref Range   Troponin I (High Sensitivity) 6 <18 ng/L  CBC with Differential     Status: Abnormal   Collection Time: 08/25/22  6:28 AM  Result Value Ref Range   WBC 14.2 (H) 4.0 - 10.5 K/uL   RBC 4.51 3.87 - 5.11 MIL/uL   Hemoglobin 15.1 (H) 12.0 - 15.0 g/dL   HCT 44.0 36.0 - 46.0 %   MCV 97.6 80.0 - 100.0 fL   MCH 33.5 26.0 - 34.0 pg   MCHC 34.3 30.0 - 36.0 g/dL   RDW 12.9 11.5 - 15.5 %   Platelets 253 150 - 400 K/uL   nRBC 0.0 0.0 - 0.2 %   Neutrophils Relative % 89 %   Neutro Abs 12.7 (H) 1.7 - 7.7 K/uL   Lymphocytes Relative 6 %   Lymphs Abs 0.8 0.7 - 4.0 K/uL   Monocytes Relative 4 %   Monocytes Absolute 0.5 0.1 - 1.0 K/uL   Eosinophils Relative 1 %   Eosinophils Absolute 0.1 0.0 - 0.5 K/uL   Basophils Relative 0 %   Basophils Absolute 0.0 0.0 - 0.1 K/uL   Immature Granulocytes 0 %   Abs Immature Granulocytes 0.06 0.00 - 0.07 K/uL  Magnesium     Status: None   Collection Time:  08/25/22  6:28 AM  Result Value Ref Range   Magnesium 1.8 1.7 - 2.4 mg/dL  Troponin I (High Sensitivity)     Status: None   Collection Time: 08/25/22  8:30 AM  Result Value Ref Range   Troponin I (High Sensitivity) 5 <18 ng/L   CT ab/pelvis 1. Evidence of distal common bile duct Choledocholithiasis (coronal image 64). No biliary ductal dilatation or pancreatic inflammation is evident. Query hyperbilirubinemia. 2. Fluid throughout nondilated small and large bowel compatible with Enteritis/Diarrhea. No discrete bowel inflammation. Normal appendix. 3. Chronic moderate size gastric hiatal hernia. 4. Aortic Atherosclerosis (ICD10-I70.0).  EKG: jxn tach, RBBB/IVCD  Assessment and Plan: AKI     - placed in obs, tele     - no obstruction seen on imaging     - watch nephrotoxins     - fluids, follow  Diarrhea     - c diff, GI PCR pending     - fluids     - if w/u neg, can add lomotil  Choledocholithiasis     - no ab pain     - LFTs are all normal     - will check RUQ ab [place]   N/V     - anti-emetics, fluids  HTN     - hold home diuretic and ACEi d/t AKI  HLD     - resume home regimen when confirmed  Hypothyroidism     -  resume home regimen when confirmed  DM2     - A1c, SSI, glucose checks, DM diet  GERD     - hold her PPI until we know if her diarrhea is infectious  OSA on CPAP     - CPAP qHS  Hemorrhoids     - suppositories; monitor  Anxiety Depression     - continue home regimen when confirmed  Advance Care Planning:   Code Status: FULL  Consults: None  Family Communication: None at bedside.  Severity of Illness: The appropriate patient status for this patient is OBSERVATION. Observation status is judged to be reasonable and necessary in order to provide the required intensity of service to ensure the patient's safety. The patient's presenting symptoms, physical exam findings, and initial radiographic and laboratory data in the context of their medical  condition is felt to place them at decreased risk for further clinical deterioration. Furthermore, it is anticipated that the patient will be medically stable for discharge from the hospital within 2 midnights of admission.   Time spent in coordination of this H&P: 65 minutes  Author: Jonnie Finner, DO 08/25/2022 12:59 PM  For on call review [email or web address].

## 2022-08-25 NOTE — ED Notes (Signed)
[facility] at bedside, report given.

## 2022-08-25 NOTE — ED Notes (Signed)
?  MI per Dr. Ralene Bathe, now at bedside.

## 2022-08-25 NOTE — Progress Notes (Signed)
Plan of Care Note for accepted transfer   Patient: Kristin [name] MRN: [id number]   DOA: 08/25/2022  Facility requesting transfer: [facility] Requesting Provider: Dr. Sherry Ruffing Reason for transfer: AKI Facility course: 76 yo F w/ PMHx of DM2, CKD4, hypothyroidism, HTN, HLD. Presenting with AKI, diarrhea. She's had 2-3 days of N/V/D. No ab pain. W/u revealed Scr up to 2.5 (up from 1.6). CT shows enteritis and a random small choledocholithiasis. LFTs are normal. She was started on fluids. GI PCR/C diff pending.   Plan of care: The patient is accepted for admission to Telemetry unit, at [facility]. While holding at [facility], medical decision making responsibilities remain with the [facility]. Upon arrival to [facility], [facility] will assume care. Thank you.  Author: Jonnie Finner, DO 08/25/2022  Check [email or web address] for on-call coverage.  Nursing staff, Please call [facility] number on Amion as soon as patient's arrival, so appropriate admitting provider can evaluate the pt.

## 2022-08-25 NOTE — ED Notes (Signed)
Pt requested food. Pt provided with crackers and ginger ale.

## 2022-08-25 NOTE — ED Notes (Signed)
ED Provider at bedside. 

## 2022-08-25 NOTE — ED Provider Notes (Signed)
7:40 AM Care assumed from Dr. Ralene Bathe.  At time of transfer of care, patient awaiting for results of CT and reassessment after fluids.  Plan of care will be to admit for likely diarrhea causing electrolyte abnormalities and acute kidney injury.  Dissipate admission after work-up is completed.  9:01 AM CT scan shows no evidence of diverticulitis, appendicitis, obstruction but does show the enteritis/diarrhea still present.  There was also a possible incidental finding of a small choledocholithiasis stone with no inflammation or swelling or other abnormality.  Given her normal LFTs and normal bilirubin, I have low suspicion that this is causing her symptoms.  This may need outpatient follow-up but will discuss with medicine team.  Due to the continued nausea, the dehydration, and the AKI with continued diarrhea, do not feel patient will adequately able to improve her hydration at home.  We will call for admission for AKI and rehydration.   Clinical Impression: 1. Dehydration   2. Nausea vomiting and diarrhea   3. AKI (acute kidney injury) ([place])     Disposition: Admit  This note was prepared with assistance of Dragon voice recognition software. Occasional wrong-word or sound-a-like substitutions may have occurred due to the inherent limitations of voice recognition software.     Yasamin Karel, Gwenyth Allegra, MD 08/25/22 1530

## 2022-08-25 NOTE — ED Notes (Signed)
Cardiac pads on hold per Dr. Ralene Bathe - provider to consult with cardiology.

## 2022-08-25 NOTE — ED Triage Notes (Addendum)
Pt via POV with spouse- to exam room via w/c -- reports onset of diarrhea with indigestion that began 2 days pta- had taken Pepcid AC and Loperamide; indigestion improved; diarrhea has since and pt now also now having vomiting that began approx 3-4hrs pta with "a knot" that pt reports she feels in epigastric region. Pt states approx 5 episodes of diarrhea within 2 hrs of arrival and 1 episode of vomiting since early a.m.

## 2022-08-26 DIAGNOSIS — G4733 Obstructive sleep apnea (adult) (pediatric): Secondary | ICD-10-CM | POA: Diagnosis present

## 2022-08-26 DIAGNOSIS — Z7984 Long term (current) use of oral hypoglycemic drugs: Secondary | ICD-10-CM | POA: Diagnosis not present

## 2022-08-26 DIAGNOSIS — Z79899 Other long term (current) drug therapy: Secondary | ICD-10-CM | POA: Diagnosis not present

## 2022-08-26 DIAGNOSIS — A084 Viral intestinal infection, unspecified: Secondary | ICD-10-CM | POA: Diagnosis present

## 2022-08-26 DIAGNOSIS — N1832 Chronic kidney disease, stage 3b: Secondary | ICD-10-CM | POA: Diagnosis present

## 2022-08-26 DIAGNOSIS — K649 Unspecified hemorrhoids: Secondary | ICD-10-CM | POA: Diagnosis present

## 2022-08-26 DIAGNOSIS — Z96652 Presence of left artificial knee joint: Secondary | ICD-10-CM | POA: Diagnosis present

## 2022-08-26 DIAGNOSIS — E039 Hypothyroidism, unspecified: Secondary | ICD-10-CM | POA: Diagnosis present

## 2022-08-26 DIAGNOSIS — Z853 Personal history of malignant neoplasm of breast: Secondary | ICD-10-CM | POA: Diagnosis not present

## 2022-08-26 DIAGNOSIS — F32A Depression, unspecified: Secondary | ICD-10-CM | POA: Diagnosis present

## 2022-08-26 DIAGNOSIS — Z79890 Hormone replacement therapy (postmenopausal): Secondary | ICD-10-CM | POA: Diagnosis not present

## 2022-08-26 DIAGNOSIS — Z923 Personal history of irradiation: Secondary | ICD-10-CM | POA: Diagnosis not present

## 2022-08-26 DIAGNOSIS — Z803 Family history of malignant neoplasm of breast: Secondary | ICD-10-CM | POA: Diagnosis not present

## 2022-08-26 DIAGNOSIS — Z888 Allergy status to other drugs, medicaments and biological substances status: Secondary | ICD-10-CM | POA: Diagnosis not present

## 2022-08-26 DIAGNOSIS — E1122 Type 2 diabetes mellitus with diabetic chronic kidney disease: Secondary | ICD-10-CM | POA: Diagnosis present

## 2022-08-26 DIAGNOSIS — E7800 Pure hypercholesterolemia, unspecified: Secondary | ICD-10-CM | POA: Diagnosis present

## 2022-08-26 DIAGNOSIS — Z885 Allergy status to narcotic agent status: Secondary | ICD-10-CM | POA: Diagnosis not present

## 2022-08-26 DIAGNOSIS — F419 Anxiety disorder, unspecified: Secondary | ICD-10-CM | POA: Diagnosis present

## 2022-08-26 DIAGNOSIS — N179 Acute kidney failure, unspecified: Secondary | ICD-10-CM | POA: Diagnosis present

## 2022-08-26 DIAGNOSIS — I129 Hypertensive chronic kidney disease with stage 1 through stage 4 chronic kidney disease, or unspecified chronic kidney disease: Secondary | ICD-10-CM | POA: Diagnosis present

## 2022-08-26 DIAGNOSIS — E860 Dehydration: Secondary | ICD-10-CM | POA: Diagnosis present

## 2022-08-26 DIAGNOSIS — K219 Gastro-esophageal reflux disease without esophagitis: Secondary | ICD-10-CM | POA: Diagnosis present

## 2022-08-26 DIAGNOSIS — Z6841 Body Mass Index (BMI) 40.0 and over, adult: Secondary | ICD-10-CM | POA: Diagnosis not present

## 2022-08-26 LAB — CBC
HCT: 37.7 % (ref 36.0–46.0)
Hemoglobin: 12.2 g/dL (ref 12.0–15.0)
MCH: 33.3 pg (ref 26.0–34.0)
MCHC: 32.4 g/dL (ref 30.0–36.0)
MCV: 103 fL — ABNORMAL HIGH (ref 80.0–100.0)
Platelets: 170 10*3/uL (ref 150–400)
RBC: 3.66 MIL/uL — ABNORMAL LOW (ref 3.87–5.11)
RDW: 13.1 % (ref 11.5–15.5)
WBC: 7.6 10*3/uL (ref 4.0–10.5)
nRBC: 0 % (ref 0.0–0.2)

## 2022-08-26 LAB — COMPREHENSIVE METABOLIC PANEL
ALT: 16 U/L (ref 0–44)
AST: 15 U/L (ref 15–41)
Albumin: 3.4 g/dL — ABNORMAL LOW (ref 3.5–5.0)
Alkaline Phosphatase: 71 U/L (ref 38–126)
Anion gap: 6 (ref 5–15)
BUN: 48 mg/dL — ABNORMAL HIGH (ref 8–23)
CO2: 20 mmol/L — ABNORMAL LOW (ref 22–32)
Calcium: 8.9 mg/dL (ref 8.9–10.3)
Chloride: 112 mmol/L — ABNORMAL HIGH (ref 98–111)
Creatinine, Ser: 2.54 mg/dL — ABNORMAL HIGH (ref 0.44–1.00)
GFR, Estimated: 19 mL/min — ABNORMAL LOW (ref >60)
Glucose, Bld: 93 mg/dL (ref 70–99)
Potassium: 4.3 mmol/L (ref 3.5–5.1)
Sodium: 138 mmol/L (ref 135–145)
Total Bilirubin: 0.8 mg/dL (ref 0.3–1.2)
Total Protein: 5.7 g/dL — ABNORMAL LOW (ref 6.5–8.1)

## 2022-08-26 LAB — MRSA NEXT GEN BY PCR, NASAL: MRSA by PCR Next Gen: NOT DETECTED

## 2022-08-26 LAB — HEMOGLOBIN A1C
Hgb A1c MFr Bld: 6 % — ABNORMAL HIGH (ref 4.8–5.6)
Mean Plasma Glucose: 125.5 mg/dL

## 2022-08-26 LAB — GLUCOSE, CAPILLARY
Glucose-Capillary: 74 mg/dL (ref 70–99)
Glucose-Capillary: 92 mg/dL (ref 70–99)
Glucose-Capillary: 92 mg/dL (ref 70–99)
Glucose-Capillary: 88 mg/dL (ref 70–99)

## 2022-08-26 MED ORDER — FERROUS SULFATE 325 (65 FE) MG PO TABS
325.0000 mg | ORAL_TABLET | Freq: Every day | ORAL | Status: DC
  Administered 2022-08-26: 325 mg via ORAL
  Filled 2022-08-26: qty 1

## 2022-08-26 MED ORDER — ATORVASTATIN CALCIUM 40 MG PO TABS
40.0000 mg | ORAL_TABLET | Freq: Every evening | ORAL | Status: DC
  Administered 2022-08-26: 40 mg via ORAL
  Filled 2022-08-26: qty 1

## 2022-08-26 MED ORDER — FAMOTIDINE 20 MG PO TABS
20.0000 mg | ORAL_TABLET | Freq: Every day | ORAL | Status: DC
  Administered 2022-08-26: 20 mg via ORAL
  Filled 2022-08-26: qty 1

## 2022-08-26 MED ORDER — LEVOTHYROXINE SODIUM 75 MCG PO TABS
75.0000 ug | ORAL_TABLET | Freq: Every day | ORAL | Status: DC
  Administered 2022-08-26 – 2022-08-27 (×2): 75 ug via ORAL
  Filled 2022-08-26 (×2): qty 1

## 2022-08-26 MED ORDER — METOPROLOL SUCCINATE ER 50 MG PO TB24
50.0000 mg | ORAL_TABLET | Freq: Every day | ORAL | Status: DC
  Administered 2022-08-26: 50 mg via ORAL
  Filled 2022-08-26 (×2): qty 1

## 2022-08-26 MED ORDER — ASPIRIN 81 MG PO TBEC
81.0000 mg | DELAYED_RELEASE_TABLET | ORAL | Status: DC
  Administered 2022-08-26: 81 mg via ORAL
  Filled 2022-08-26: qty 1

## 2022-08-26 MED ORDER — ISOSORBIDE MONONITRATE ER 30 MG PO TB24
30.0000 mg | ORAL_TABLET | Freq: Every day | ORAL | Status: DC
  Administered 2022-08-26 – 2022-08-27 (×2): 30 mg via ORAL
  Filled 2022-08-26 (×2): qty 1

## 2022-08-26 NOTE — Progress Notes (Signed)
Spiritual Consult to provide prayer for patient. Met with patient provided presence, support and prayer. Husband is undergoing procedure tomorrow, told her I would also remember him.

## 2022-08-26 NOTE — Progress Notes (Signed)
Mobility Specialist - Progress Note   08/26/22 1016  Mobility  Activity Ambulated with assistance in hallway  Level of Assistance Modified independent, requires aide device or extra time  Assistive Device None (IV pole)  Distance Ambulated (ft) 500 ft  Activity Response Tolerated well  $Mobility charge 1 Mobility   Pt received in bed and agreed for mobility, no c/o pain nor discomfort. Pt returned to bed with all needs met.   Roderick Pee Mobility Specialist

## 2022-08-26 NOTE — Progress Notes (Signed)
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  She denies any abdominal pain.  She could not really identify a trigger, and reports that occasionally she does get this bouts of diarrhea that are self-limited.  This time around, her symptoms did not improve and when she started feeling weak and lightheaded decided to seek care.  She was found to have acute kidney injury with a creatinine of 2.5, leukocytosis with a WBC of 14 and she was admitted to the hospital.  Significant events: 9/17-Hospital admit  Significant imaging / results / micro data: CT abdomen pelvis without contrast 9/17-fluid throughout nondilated small and large bowel compatible with enteritis/diarrhea.  No discrete bowel inflammation, normal appendix.  Evidence of CBD choledocholithiasis without biliary ductal dilatation or pancreatic inflammation. Right upper quadrant ultrasound 9/17-status post cholecystectomy, no evidence of biliary dilatation or choledocholithiasis.  Hepatic steatosis  Subjective / 24h Interval events: She is doing well this morning, no significant nausea or vomiting.  Assesement and Plan: Principal Problem:   AKI (acute kidney injury) ([place]) Active Problems:   Diabetes ([place])   Diarrhea   Choledocholithiasis   Nausea and vomiting   HTN (hypertension)   HLD (hyperlipidemia)   Hypothyroidism   GERD (gastroesophageal reflux disease)   OSA (obstructive sleep apnea)   Hemorrhoid  Principal problem Acute kidney injury on chronic kidney disease stage 3b-baseline creatinine 1.6-1.7, clinically still looks dry with a creatinine around 2.5 this morning.  Reports that she is not making as much urine as she would  expect with fluids.  Continue IV fluids, increase the rate today.  Advance diet as tolerated.  Active problems Diarrheal illness-possibly viral gastroenteritis.  No recent antibiotics exposure.  She did have an elevated WBC on admission but now has normalized.  Has not had any more diarrhea since hospitalized so stool sample could not be sent.  If she has recurrent diarrhea obtain C. difficile and GI pathogen panel  Questionable choledocholithiasis-no abdominal pain, LFTs are all normal.  Right upper quadrant ultrasound without CBD dilation.  Unlikely of significance, I will discuss with GI  Essential hypertension-hold home diuretics and ACE inhibitors due to acute kidney injury.  Resume metoprolol.  She is normotensive  Hypothyroidism-resume home Synthroid  Hyperlipidemia-resume statin today  DM2-hold home agents, placed on sliding scale  CBG (last 3)  Recent Labs    08/25/22 1626 08/25/22 2022 08/26/22 0751  GLUCAP 89 109* 74    Scheduled Meds:  gabapentin  600 mg Oral QHS   Gerhardt's butt cream   Topical BID   hydrocortisone   Rectal BID   insulin aspart  0-15 Units Subcutaneous TID WC   insulin aspart  0-5 Units Subcutaneous QHS   Continuous Infusions:  sodium chloride 100 mL/hr at 08/26/22 0027   PRN Meds:.acetaminophen **OR** acetaminophen, prochlorperazine, zolpidem  Current Outpatient Medications  Medication Instructions   aspirin EC 81 mg, Oral, Every other day, Swallow whole.   atorvastatin (LIPITOR) 40 mg, Oral, Every evening   calcium carbonate (CALCIUM 600) 600 mg, Oral, Daily with breakfast   cyanocobalamin (VITAMIN B12) 1,000 mcg, Oral, Daily   famotidine (PEPCID) 20 mg, Oral, Daily at bedtime   ferrous sulfate 325 mg, Oral, Daily at bedtime   Fish  Oil 1,200 mg, Oral, Daily at bedtime   fluticasone (FLONASE) 50 MCG/ACT nasal spray 1 spray, Each Nare, Daily at bedtime   furosemide (LASIX) 10 mg, Oral, Daily PRN   gabapentin (NEURONTIN) 600 mg, Oral, Daily  at bedtime   hydrALAZINE (APRESOLINE) 75 mg, Oral, 2 times daily   isosorbide mononitrate (IMDUR) 30 mg, Oral, Daily   Jardiance 10 mg, Oral, Daily   lisinopril (ZESTRIL) 20 mg, Oral, Daily   MAGNESIUM GLYCINATE PO 600 mg, Oral, 2 times daily   Melatonin 10 mg, Oral, At bedtime PRN   metoprolol succinate (TOPROL-XL) 50 mg, Oral, Daily   Mounjaro 12.5 mg, Subcutaneous, Weekly, Friday   Multiple Vitamins-Minerals (MULTIVITAMIN ADULT PO) 1 tablet, Oral, Daily   niacin ((VITAMIN B3)) 500 mg, Oral, 2 times daily   Synthroid 75 mcg, Oral, Daily   Vitamin D 5,000 Units, Oral, Daily   zolpidem (AMBIEN CR) 12.5 mg, Oral, Daily at bedtime    Diet Orders (From admission, onward)     Start     Ordered   08/25/22 1354  Diet Carb Modified Fluid consistency: Thin; Room service appropriate? Yes  Diet effective now       Question Answer Comment  Diet-HS Snack? Nothing   Calorie Level Medium 1600-2000   Fluid consistency: Thin   Room service appropriate? Yes      08/25/22 1359            DVT prophylaxis: SCDs Start: 08/25/22 1355   Lab Results  Component Value Date   PLT 170 08/26/2022      Code Status: Full Code  Family Communication: no family at bedside   Status is: Observation  The patient will require care spanning > 2 midnights and should be moved to inpatient because: Continues to require IV fluids for persistent acute kidney injury  Level of care: Telemetry  Consultants:  None  Objective: Vitals:   08/25/22 2252 08/26/22 0026 08/26/22 0049 08/26/22 0502  BP:   121/64 132/63  Pulse:   88 76  Resp: (!) '26 16 17 19  '$ Temp:   97.9 F (36.6 C) (!) 97.3 F (36.3 C)  TempSrc:   Oral Oral  SpO2:   95% 99%  Weight:      Height:        Intake/Output Summary (Last 24 hours) at 08/26/2022 1005 Last data filed at 08/25/2022 1507 Gross per 24 hour  Intake 15.81 ml  Output --  Net 15.81 ml   Wt Readings from Last 3 Encounters:  08/25/22 108.6 kg  01/14/22 108.9 kg   04/26/20 118.4 kg    Examination:  Constitutional: NAD Eyes: no scleral icterus ENMT: Mucous membranes are moist.  Neck: normal, supple Respiratory: clear to auscultation bilaterally, no wheezing, no crackles. Normal respiratory effort. No accessory muscle use.  Cardiovascular: Regular rate and rhythm, no murmurs / rubs / gallops. No LE edema.  Abdomen: non distended, no tenderness. Bowel sounds positive.  Musculoskeletal: no clubbing / cyanosis.  Skin: no rashes Neurologic: non focal   Data Reviewed: I have independently reviewed following labs and imaging studies   CBC Recent Labs  Lab 08/25/22 0628 08/26/22 0500  WBC 14.2* 7.6  HGB 15.1* 12.2  HCT 44.0 37.7  PLT 253 170  MCV 97.6 103.0*  MCH 33.5 33.3  MCHC 34.3 32.4  RDW 12.9 13.1  LYMPHSABS 0.8  --   MONOABS 0.5  --   EOSABS 0.1  --   BASOSABS 0.0  --  Recent Labs  Lab 08/25/22 0628 08/26/22 0500  NA 135 138  K 3.9 4.3  CL 103 112*  CO2 21* 20*  GLUCOSE 192* 93  BUN 44* 48*  CREATININE 2.56* 2.54*  CALCIUM 9.9 8.9  AST 22 15  ALT 20 16  ALKPHOS 91 71  BILITOT 0.7 0.8  ALBUMIN 4.2 3.4*  MG 1.8  --   HGBA1C  --  6.0*    ------------------------------------------------------------------------------------------------------------------ No results for input(s): "CHOL", "HDL", "LDLCALC", "TRIG", "CHOLHDL", "LDLDIRECT" in the last 72 hours.  Lab Results  Component Value Date   HGBA1C 6.0 (H) 08/26/2022   ------------------------------------------------------------------------------------------------------------------ No results for input(s): "TSH", "T4TOTAL", "T3FREE", "THYROIDAB" in the last 72 hours.  Invalid input(s): "FREET3"  Cardiac Enzymes No results for input(s): "CKMB", "TROPONINI", "MYOGLOBIN" in the last 168 hours.  Invalid input(s): "CK" ------------------------------------------------------------------------------------------------------------------ No results found for:  "BNP"  CBG: Recent Labs  Lab 08/25/22 1309 08/25/22 1626 08/25/22 2022 08/26/22 0751  GLUCAP 106* 89 109* 74    No results found for this or any previous visit (from the past 240 hour(s)).   Radiology Studies: US Abdomen Limited RUQ (LIVER/GB)  Result Date: 08/25/2022 CLINICAL DATA:  Choledocholithiasis suspected by prior CT, history of cholecystectomy EXAM: ULTRASOUND ABDOMEN LIMITED RIGHT UPPER QUADRANT COMPARISON:  None Available. FINDINGS: Examination is significantly limited by body habitus and bowel gas. Gallbladder: Status post cholecystectomy.  No sonographic Murphy sign. Common bile duct: Diameter: 0.2 cm. Liver: No focal lesion identified. Increased parenchymal echogenicity. Portal vein is patent on color Doppler imaging with normal direction of blood flow towards the liver. Other: None. IMPRESSION: 1. Ultrasound examination is technically very limited by body habitus and overlying bowel gas. 2. Status post cholecystectomy. 3. No evidence of biliary dilatation or choledocholithiasis. 4. Hepatic steatosis. Electronically Signed   By: Delanna Ahmadi M.D.   On: 08/25/2022 16:46     Marzetta Board, MD, PhD [facility]  Between 7 am - 7 pm I am available, please contact me via Amion (for emergencies) or Securechat (non urgent messages)  Between 7 pm - 7 am I am not available, please contact night coverage MD/APP via Amion

## 2022-08-26 NOTE — Progress Notes (Signed)
  Transition of Care [facility]) Screening Note   Patient Details  Name: Kristin [name] Date of Birth: 1946-10-04   Transition [facility] CM/SW Contact:    Vassie Moselle, LCSW Phone Number: 08/26/2022, 11:30 AM    Transition of Care Department [facility]) has reviewed patient and no TOC needs have been identified at this time. We will continue to monitor patient advancement through interdisciplinary progression rounds. If new patient transition needs arise, please place a TOC consult.

## 2022-08-27 DIAGNOSIS — N179 Acute kidney failure, unspecified: Secondary | ICD-10-CM | POA: Diagnosis not present

## 2022-08-27 LAB — GLUCOSE, CAPILLARY
Glucose-Capillary: 71 mg/dL (ref 70–99)
Glucose-Capillary: 89 mg/dL (ref 70–99)

## 2022-08-27 LAB — CBC
HCT: 33.3 % — ABNORMAL LOW (ref 36.0–46.0)
Hemoglobin: 10.8 g/dL — ABNORMAL LOW (ref 12.0–15.0)
MCH: 33.5 pg (ref 26.0–34.0)
MCHC: 32.4 g/dL (ref 30.0–36.0)
MCV: 103.4 fL — ABNORMAL HIGH (ref 80.0–100.0)
Platelets: 136 10*3/uL — ABNORMAL LOW (ref 150–400)
RBC: 3.22 MIL/uL — ABNORMAL LOW (ref 3.87–5.11)
RDW: 12.9 % (ref 11.5–15.5)
WBC: 5.6 10*3/uL (ref 4.0–10.5)
nRBC: 0 % (ref 0.0–0.2)

## 2022-08-27 LAB — COMPREHENSIVE METABOLIC PANEL
ALT: 15 U/L (ref 0–44)
AST: 14 U/L — ABNORMAL LOW (ref 15–41)
Albumin: 3.2 g/dL — ABNORMAL LOW (ref 3.5–5.0)
Alkaline Phosphatase: 67 U/L (ref 38–126)
Anion gap: 6 (ref 5–15)
BUN: 40 mg/dL — ABNORMAL HIGH (ref 8–23)
CO2: 20 mmol/L — ABNORMAL LOW (ref 22–32)
Calcium: 8.5 mg/dL — ABNORMAL LOW (ref 8.9–10.3)
Chloride: 116 mmol/L — ABNORMAL HIGH (ref 98–111)
Creatinine, Ser: 1.87 mg/dL — ABNORMAL HIGH (ref 0.44–1.00)
GFR, Estimated: 27 mL/min — ABNORMAL LOW (ref >60)
Glucose, Bld: 79 mg/dL (ref 70–99)
Potassium: 4.2 mmol/L (ref 3.5–5.1)
Sodium: 142 mmol/L (ref 135–145)
Total Bilirubin: 0.9 mg/dL (ref 0.3–1.2)
Total Protein: 5.2 g/dL — ABNORMAL LOW (ref 6.5–8.1)

## 2022-08-27 LAB — GASTROINTESTINAL PANEL BY PCR, STOOL (REPLACES STOOL CULTURE)

## 2022-08-27 LAB — MAGNESIUM: Magnesium: 1.8 mg/dL (ref 1.7–2.4)

## 2022-08-27 NOTE — Progress Notes (Signed)
Mobility Specialist - Progress Note   08/27/22 0954  Mobility  Activity Ambulated with assistance in hallway  Level of Assistance Independent after set-up  Assistive Device None  Distance Ambulated (ft) 500 ft  Activity Response Tolerated well  $Mobility charge 1 Mobility   Pt received in chair and agreed for mobility, no c/o pain nor discomfort during ambulation. Pt to chair with all needs met.  Roderick Pee Mobility Specialist

## 2022-08-27 NOTE — Discharge Instructions (Signed)
Follow with PCP in 5-7 days  Follow up with gastroenterology in 1-2 weeks regarding the small stone in the bile duct.  Please get a complete blood count and chemistry panel checked by your Primary MD at your next visit, and again as instructed by your Primary MD. Please get your medications reviewed and adjusted by your Primary MD.  Please request your Primary MD to go over all Hospital Tests and Procedure/Radiological results at the follow up, please get all Hospital records sent to your Prim MD by signing hospital release before you go home.  In some cases, there will be blood work, cultures and biopsy results pending at the time of your discharge. Please request that your primary care M.D. goes through all the records of your hospital data and follows up on these results.  If you had Pneumonia of Lung problems at the Hospital: Please get a 2 view Chest X ray done in 6-8 weeks after hospital discharge or sooner if instructed by your Primary MD.  If you have Congestive Heart Failure: Please call your Cardiologist or Primary MD anytime you have any of the following symptoms:  1) 3 pound weight gain in 24 hours or 5 pounds in 1 week  2) shortness of breath, with or without a dry hacking cough  3) swelling in the hands, feet or stomach  4) if you have to sleep on extra pillows at night in order to breathe  Follow cardiac low salt diet and 1.5 lit/day fluid restriction.  If you have diabetes Accuchecks 4 times/day, Once in AM empty stomach and then before each meal. Log in all results and show them to your primary doctor at your next visit. If any glucose reading is under 80 or above 300 call your primary MD immediately.  If you have Seizure/Convulsions/Epilepsy: Please do not drive, operate heavy machinery, participate in activities at heights or participate in high speed sports until you have seen by Primary MD or a Neurologist and advised to do so again. Per [facility] statutes,  patients with seizures are not allowed to drive until they have been seizure-free for six months.  Use caution when using heavy equipment or power tools. Avoid working on ladders or at heights. Take showers instead of baths. Ensure the water temperature is not too high on the home water heater. Do not go swimming alone. Do not lock yourself in a room alone (i.e. bathroom). When caring for infants or small children, sit down when holding, feeding, or changing them to minimize risk of injury to the child in the event you have a seizure. Maintain good sleep hygiene. Avoid alcohol.   If you had Gastrointestinal Bleeding: Please ask your Primary MD to check a complete blood count within one week of discharge or at your next visit. Your endoscopic/colonoscopic biopsies that are pending at the time of discharge, will also need to followed by your Primary MD.  Get Medicines reviewed and adjusted. Please take all your medications with you for your next visit with your Primary MD  Please request your Primary MD to go over all hospital tests and procedure/radiological results at the follow up, please ask your Primary MD to get all Hospital records sent to his/her office.  If you experience worsening of your admission symptoms, develop shortness of breath, life threatening emergency, suicidal or homicidal thoughts you must seek medical attention immediately by calling 911 or calling your MD immediately  if symptoms less severe.  You must read complete instructions/literature  along with all the possible adverse reactions/side effects for all the Medicines you take and that have been prescribed to you. Take any new Medicines after you have completely understood and accpet all the possible adverse reactions/side effects.   Do not drive or operate heavy machinery when taking Pain medications.   Do not take more than prescribed Pain, Sleep and Anxiety Medications  Special Instructions: If you have smoked or chewed  Tobacco  in the last 2 yrs please stop smoking, stop any regular Alcohol  and or any Recreational drug use.  Wear Seat belts while driving.  Please note You were cared for by a hospitalist during your hospital stay. If you have any questions about your discharge medications or the care you received while you were in the hospital after you are discharged, you can call the unit and asked to speak with the hospitalist on call if the hospitalist that took care of you is not available. Once you are discharged, your primary care physician will handle any further medical issues. Please note that NO REFILLS for any discharge medications will be authorized once you are discharged, as it is imperative that you return to your primary care physician (or establish a relationship with a primary care physician if you do not have one) for your aftercare needs so that they can reassess your need for medications and monitor your lab values.  You can reach the hospitalist office at phone [phone] or fax [phone]   If you do not have a primary care physician, you can call [phone] for a physician referral.  Activity: As tolerated with Full fall precautions use walker/cane & assistance as needed    Diet: regular  Disposition Home

## 2022-08-27 NOTE — Discharge Summary (Addendum)
Physician Discharge Summary  Kristin [name] PJA:[id number] DOB: August 24, 1946 DOA: 08/25/2022  PCP: Pcp, No  Admit date: 08/25/2022 Discharge date: 08/27/2022  Admitted From: home Disposition:  home  Recommendations for Outpatient Follow-up:  Follow up with PCP in 1-2 weeks Please obtain BMP/CBC in one week Follow up with Dr Alessandra Bevels for the incidental CBD stone  Home Health: none Equipment/Devices: none  Discharge Condition: stable CODE STATUS: Full code  HPI: Per admitting MD, Kristin [name] is a 76 y.o. female with medical history significant of DM2, CKD4, hypothyroidism, HTN, HLD. Presenting with N/V/D. She reports her symptoms started 2 days ago. She didn't have any abdomen pain. She noticed that she was having diarrhea and some indigestion. She took some loperamide and pepcid, but they didn't seem to help. Her diarrhea worsened over the next day. She then began to have nausea and an episode of vomiting. She tried some zofran for the nausea.  She didn't have any fevers, sick contacts, recent abx use. She didn't have any recent exotic meals. She reports that her diarrhea became more watery and she started developing fatigue this morning. So she decided to come to the ED for evaluation. She denies any other aggravating or alleviating factors.   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  Her baseline creatinine is around 1.6-1.7.  She was placed on IV fluids, creatinine has improved and returned  to her baseline, 1.8 on discharge.  She is followed by nephrology regularly at [facility].   Active problems Diarrheal illness-possibly viral gastroenteritis.  No recent antibiotics exposure.  She did have an elevated WBC on admission but now has normalized.  Her diarrhea has resolved shortly after admission.  She did have 1 loose bowel movements in the last 24 hours, GI pathogen panel was negative.  C. difficile could not be run due to the stool being too formed.  She no longer has diarrhea, she is tolerating a regular diet, no nausea or vomiting and will be discharged home in stable condition  Questionable choledocholithiasis-CT scan showed small calcified stone in the distal common bile duct. RUQ US showed prior cholecystectomy but could not identify CBD dilatation nor choledocholithiasis. She has no abdominal pain, LFTs are all normal. Case briefly discussed with Dr Paulita Fujita, given calcification the stone might be chronic in nature. She is asymptomatic, recommend follow up with her primary gastroenterologist. She has seen Dr Alessandra Bevels in the past Essential hypertension-resume home medications Hypothyroidism-resume home Synthroid Hyperlipidemia-resume home medications DM2-resume home medications  Sepsis ruled out   Discharge Instructions   Allergies as of 08/27/2022       Reactions   Ibuprofen    Does not take due to kidney function    Levodopa Other (See Comments)   Increase of dopamine levels   Oxycodone    Pt cannot tolerate oxycodone- makes her "loopy"/ AMS        Medication List     TAKE these medications    aspirin EC 81 MG tablet Take 81 mg by mouth every  other day. Swallow whole.   atorvastatin 40 MG tablet Commonly known as: LIPITOR Take 40 mg by mouth every evening.   Calcium 600 600 MG Tabs tablet Generic drug: calcium carbonate Take 600 mg by mouth daily with breakfast.   cyanocobalamin 1000 MCG tablet Commonly known as: VITAMIN B12 Take 1,000 mcg by mouth  daily.   famotidine 20 MG tablet Commonly known as: PEPCID Take 20 mg by mouth at bedtime.   ferrous sulfate 325 (65 FE) MG tablet Take 325 mg by mouth at bedtime.   Fish Oil 1200 MG Caps Take 1,200 mg by mouth at bedtime.   fluticasone 50 MCG/ACT nasal spray Commonly known as: FLONASE Place 1 spray into both nostrils at bedtime.   gabapentin 300 MG capsule Commonly known as: NEURONTIN Take 600 mg by mouth at bedtime.   hydrALAZINE 25 MG tablet Commonly known as: APRESOLINE Take 75 mg by mouth in the morning and at bedtime.   isosorbide mononitrate 30 MG 24 hr tablet Commonly known as: IMDUR Take 30 mg by mouth daily.   Jardiance 10 MG Tabs tablet Generic drug: empagliflozin Take 10 mg by mouth daily.   Lasix 20 MG tablet Generic drug: furosemide Take 10 mg by mouth daily as needed for edema.   lisinopril 20 MG tablet Commonly known as: ZESTRIL Take 20 mg by mouth daily.   MAGNESIUM GLYCINATE PO Take 600 mg by mouth 2 (two) times daily.   Melatonin 10 MG Tabs Take 10 mg by mouth at bedtime as needed (sleep).   metoprolol succinate 50 MG 24 hr tablet Commonly known as: TOPROL-XL Take 50 mg by mouth daily.   Mounjaro 12.5 MG/0.5ML Pen Generic drug: tirzepatide Inject 12.5 mg into the skin once a week. Friday   MULTIVITAMIN ADULT PO Take 1 tablet by mouth daily.   niacin 500 MG tablet Commonly known as: (VITAMIN B3) Take 500 mg by mouth 2 (two) times daily.   Synthroid 75 MCG tablet Generic drug: levothyroxine Take 75 mcg by mouth daily.   Vitamin D 125 MCG (5000 UT) Caps Take 5,000 Units by mouth daily.   zolpidem 12.5 MG CR tablet Commonly known as: AMBIEN CR Take 12.5 mg by mouth at bedtime.         Consultations:   Procedures/Studies:  US Abdomen Limited RUQ (LIVER/GB)  Result Date: 08/25/2022 CLINICAL DATA:  Choledocholithiasis suspected by prior CT, history of cholecystectomy EXAM: ULTRASOUND ABDOMEN LIMITED RIGHT UPPER QUADRANT  COMPARISON:  None Available. FINDINGS: Examination is significantly limited by body habitus and bowel gas. Gallbladder: Status post cholecystectomy.  No sonographic Murphy sign. Common bile duct: Diameter: 0.2 cm. Liver: No focal lesion identified. Increased parenchymal echogenicity. Portal vein is patent on color Doppler imaging with normal direction of blood flow towards the liver. Other: None. IMPRESSION: 1. Ultrasound examination is technically very limited by body habitus and overlying bowel gas. 2. Status post cholecystectomy. 3. No evidence of biliary dilatation or choledocholithiasis. 4. Hepatic steatosis. Electronically Signed   By: Delanna Ahmadi M.D.   On: 08/25/2022 16:46   CT Abdomen Pelvis Wo Contrast  Result Date: 08/25/2022 CLINICAL DATA:  76 year old female with abdominal pain, nausea vomiting, diarrhea. EXAM: CT ABDOMEN AND PELVIS WITHOUT CONTRAST TECHNIQUE: Multidetector CT imaging of the abdomen and pelvis was performed following the standard protocol without IV contrast. RADIATION DOSE REDUCTION: This exam was performed according to the departmental dose-optimization program which includes automated exposure control, adjustment of the mA and/or kV according to patient size and/or use  of iterative reconstruction technique. COMPARISON:  Chest CT 07/03/2016.  Lumbar MRI 10/13/2021. FINDINGS: Lower chest: Moderate size gastric hiatal hernia appears stable since 2017. Otherwise negative lung bases. No pericardial or pleural effusion. Hepatobiliary: Absent gallbladder.  Negative noncontrast liver. Pancreas: Despite the absence of CBD or intrahepatic biliary ductal dilatation, there is evidence of a small calcified stone in the distal Common bile duct near the head of the pancreas on series 2, image 31 and on coronal image 64. Calculus is 3-4 mm. Noncontrast pancreas otherwise appears negative, no inflammation. Spleen: Incidental calcified splenic granulomas. Adrenals/Urinary Tract: Negative adrenal  glands. Negative noncontrast kidneys and ureters except for a degree of renal volume loss. Chronic left renal cyst (no follow-up imaging recommended). Incidental pelvic phleboliths. Stomach/Bowel: Fluid in nondilated large bowel to the rectum. Decompressed terminal ileum. Normal appendix (series 2, image 55). No discrete large bowel inflammation. Fluid-filled nondilated small bowel throughout much of the abdomen. Chronic moderate size gastric hiatal hernia. Intragastric food debris. Duodenum appears negative. No free air, free fluid, or focal mesenteric inflammation. Vascular/Lymphatic: Normal caliber abdominal aorta. Mild-to-moderate Aortoiliac calcified atherosclerosis. No lymphadenopathy identified. Reproductive: Negative noncontrast appearance. Other: No pelvic free fluid. Musculoskeletal: Chronic lumbar spine degeneration and spondylolisthesis. Multilevel lumbar Baastrup's disease. Lower thoracic interbody ankylosis and vacuum disc adjacent segment disease. No acute osseous abnormality identified. IMPRESSION: 1. Evidence of distal common bile duct Choledocholithiasis (coronal image 64). No biliary ductal dilatation or pancreatic inflammation is evident. Query hyperbilirubinemia. 2. Fluid throughout nondilated small and large bowel compatible with Enteritis/Diarrhea. No discrete bowel inflammation. Normal appendix. 3. Chronic moderate size gastric hiatal hernia. 4. Aortic Atherosclerosis (ICD10-I70.0). Electronically Signed   By: Genevie Ann M.D.   On: 08/25/2022 08:15     Subjective: - no chest pain, shortness of breath, no abdominal pain, nausea or vomiting.   Discharge Exam: BP (!) 142/86 (BP Location: Left Arm)   Pulse 75   Temp 98.2 F (36.8 C) (Oral)   Resp 17   Ht '5\' 4"'$  (1.626 m)   Wt 108.6 kg   SpO2 99%   BMI 41.09 kg/m   General: Pt is alert, awake, not in acute distress Cardiovascular: RRR, S1/S2 +, no rubs, no gallops Respiratory: CTA bilaterally, no wheezing, no rhonchi Abdominal:  Soft, NT, ND, bowel sounds + Extremities: no edema, no cyanosis    The results of significant diagnostics from this hospitalization (including imaging, microbiology, ancillary and laboratory) are listed below for reference.     Microbiology: Recent Results (from the past 240 hour(s))  MRSA Next Gen by PCR, Nasal     Status: None   Collection Time: 08/26/22  8:41 AM   Specimen: Nasal Mucosa; Nasal Swab  Result Value Ref Range Status   MRSA by PCR Next Gen NOT DETECTED NOT DETECTED Final    Comment: (NOTE) The GeneXpert MRSA Assay (FDA approved for NASAL specimens only), is one component of a comprehensive MRSA colonization surveillance program. It is not intended to diagnose MRSA infection nor to guide or monitor treatment for MRSA infections. Test performance is not FDA approved in patients less than 56 years old. Performed at [facility], [place] [address], [place], [place] [address]   Gastrointestinal Panel by PCR , Stool     Status: None   Collection Time: 08/26/22  1:24 PM   Specimen: Stool  Result Value Ref Range Status   Campylobacter species NOT DETECTED NOT DETECTED Final   Plesimonas shigelloides NOT DETECTED NOT DETECTED Final   Salmonella species  NOT DETECTED NOT DETECTED Final   Yersinia enterocolitica NOT DETECTED NOT DETECTED Final   Vibrio species NOT DETECTED NOT DETECTED Final   Vibrio cholerae NOT DETECTED NOT DETECTED Final   Enteroaggregative E coli (EAEC) NOT DETECTED NOT DETECTED Final   Enteropathogenic E coli (EPEC) NOT DETECTED NOT DETECTED Final   Enterotoxigenic E coli (ETEC) NOT DETECTED NOT DETECTED Final   Shiga like toxin producing E coli (STEC) NOT DETECTED NOT DETECTED Final   Shigella/Enteroinvasive E coli (EIEC) NOT DETECTED NOT DETECTED Final   Cryptosporidium NOT DETECTED NOT DETECTED Final   Cyclospora cayetanensis NOT DETECTED NOT DETECTED Final   Entamoeba histolytica NOT DETECTED NOT DETECTED Final   Giardia lamblia  NOT DETECTED NOT DETECTED Final   Adenovirus F40/41 NOT DETECTED NOT DETECTED Final   Astrovirus NOT DETECTED NOT DETECTED Final   Norovirus GI/GII NOT DETECTED NOT DETECTED Final   Rotavirus A NOT DETECTED NOT DETECTED Final   Sapovirus (I, II, IV, and V) NOT DETECTED NOT DETECTED Final    Comment: Performed at [facility], [address] [place], [place] [address]     Labs: Basic Metabolic Panel: Recent Labs  Lab 08/25/22 0628 08/26/22 0500 08/27/22 0519  NA 135 138 142  K 3.9 4.3 4.2  CL 103 112* 116*  CO2 21* 20* 20*  GLUCOSE 192* 93 79  BUN 44* 48* 40*  CREATININE 2.56* 2.54* 1.87*  CALCIUM 9.9 8.9 8.5*  MG 1.8  --  1.8   Liver Function Tests: Recent Labs  Lab 08/25/22 0628 08/26/22 0500 08/27/22 0519  AST 22 15 14*  ALT '20 16 15  '$ ALKPHOS 91 71 67  BILITOT 0.7 0.8 0.9  PROT 7.2 5.7* 5.2*  ALBUMIN 4.2 3.4* 3.2*   CBC: Recent Labs  Lab 08/25/22 0628 08/26/22 0500 08/27/22 0519  WBC 14.2* 7.6 5.6  NEUTROABS 12.7*  --   --   HGB 15.1* 12.2 10.8*  HCT 44.0 37.7 33.3*  MCV 97.6 103.0* 103.4*  PLT 253 170 136*   CBG: Recent Labs  Lab 08/26/22 1116 08/26/22 1700 08/26/22 2208 08/27/22 0900 08/27/22 1245  GLUCAP 92 92 88 71 89   Hgb A1c Recent Labs    08/26/22 0500  HGBA1C 6.0*   Lipid Profile No results for input(s): "CHOL", "HDL", "LDLCALC", "TRIG", "CHOLHDL", "LDLDIRECT" in the last 72 hours. Thyroid function studies No results for input(s): "TSH", "T4TOTAL", "T3FREE", "THYROIDAB" in the last 72 hours.  Invalid input(s): "FREET3" Urinalysis    Component Value Date/Time   COLORURINE YELLOW 04/25/2020 1256   APPEARANCEUR HAZY (A) 04/25/2020 1256   LABSPEC 1.025 04/25/2020 1256   PHURINE 5.5 04/25/2020 1256   GLUCOSEU NEGATIVE 04/25/2020 1256   [place] 04/25/2020 [place] 04/25/2020 [place] 04/25/2020 1256   PROTEINUR NEGATIVE 04/25/2020 1256   NITRITE NEGATIVE 04/25/2020 1256    LEUKOCYTESUR SMALL (A) 04/25/2020 1256    FURTHER DISCHARGE INSTRUCTIONS:   Get Medicines reviewed and adjusted: Please take all your medications with you for your next visit with your Primary MD   Laboratory/radiological data: Please request your Primary MD to go over all hospital tests and procedure/radiological results at the follow up, please ask your Primary MD to get all Hospital records sent to his/her office.   In some cases, they will be blood work, cultures and biopsy results pending at the time of your discharge. Please request that your primary care M.D. goes through all the records of your hospital  data and follows up on these results.   Also Note the following: If you experience worsening of your admission symptoms, develop shortness of breath, life threatening emergency, suicidal or homicidal thoughts you must seek medical attention immediately by calling 911 or calling your MD immediately  if symptoms less severe.   You must read complete instructions/literature along with all the possible adverse reactions/side effects for all the Medicines you take and that have been prescribed to you. Take any new Medicines after you have completely understood and accpet all the possible adverse reactions/side effects.    Do not drive when taking Pain medications or sleeping medications (Benzodaizepines)   Do not take more than prescribed Pain, Sleep and Anxiety Medications. It is not advisable to combine anxiety,sleep and pain medications without talking with your primary care practitioner   Special Instructions: If you have smoked or chewed Tobacco  in the last 2 yrs please stop smoking, stop any regular Alcohol  and or any Recreational drug use.   Wear Seat belts while driving.   Please note: You were cared for by a hospitalist during your hospital stay. Once you are discharged, your primary care physician will handle any further medical issues. Please note that NO REFILLS for any  discharge medications will be authorized once you are discharged, as it is imperative that you return to your primary care physician (or establish a relationship with a primary care physician if you do not have one) for your post hospital discharge needs so that they can reassess your need for medications and monitor your lab values.  Time coordinating discharge: 40 minutes  SIGNED:  Marzetta Board, MD, PhD 08/27/2022, 1:27 PM

## 2022-08-29 ENCOUNTER — Other Ambulatory Visit (HOSPITAL_COMMUNITY): Payer: Self-pay | Admitting: Gastroenterology

## 2022-08-29 ENCOUNTER — Other Ambulatory Visit: Payer: Self-pay | Admitting: Gastroenterology

## 2022-08-29 DIAGNOSIS — K8050 Calculus of bile duct without cholangitis or cholecystitis without obstruction: Secondary | ICD-10-CM

## 2022-09-21 ENCOUNTER — Ambulatory Visit (HOSPITAL_BASED_OUTPATIENT_CLINIC_OR_DEPARTMENT_OTHER): Payer: Medicare Other

## 2022-09-25 ENCOUNTER — Ambulatory Visit (HOSPITAL_BASED_OUTPATIENT_CLINIC_OR_DEPARTMENT_OTHER)
Admission: RE | Admit: 2022-09-25 | Discharge: 2022-09-25 | Disposition: A | Discharge status: Home / Self Care | Payer: Medicare Other | Source: Ambulatory Visit | Attending: Gastroenterology | Admitting: Gastroenterology

## 2022-09-25 DIAGNOSIS — K8050 Calculus of bile duct without cholangitis or cholecystitis without obstruction: Secondary | ICD-10-CM | POA: Insufficient documentation

## 2022-10-09 HISTORY — PX: COLONOSCOPY: SHX174

## 2023-02-24 ENCOUNTER — Ambulatory Visit (INDEPENDENT_AMBULATORY_CARE_PROVIDER_SITE_OTHER): Payer: Medicare Other | Admitting: Orthopaedic Surgery

## 2023-02-24 ENCOUNTER — Encounter: Payer: Self-pay | Admitting: Orthopaedic Surgery

## 2023-02-24 DIAGNOSIS — G8929 Other chronic pain: Secondary | ICD-10-CM

## 2023-02-24 DIAGNOSIS — M25512 Pain in left shoulder: Secondary | ICD-10-CM

## 2023-02-24 MED ORDER — ACETAMINOPHEN-CODEINE 300-30 MG PO TABS: 1.0000 | ORAL_TABLET | Freq: Three times a day (TID) | ORAL | 0 refills | Status: DC | PRN

## 2023-02-24 NOTE — Progress Notes (Signed)
The patient is well-known to me.  She is an active 77 year old female who has chronic left shoulder pain with weakness as well.  Last year we did provide a subacromial steroid injection of that left shoulder and sent her through 6 weeks of outpatient physical therapy.  She still reports decreased range of motion and and decreased strength of the left shoulder.  She is having a hard time performing her activities day living such as reaching behind her for hygiene purposes and for putting on close and undergarments.  She hurts with overhead activities.  The pain does wake her up significantly at night.  She cannot take anti-inflammatories due to other health issues.  On examination of her left shoulder there is weakness with abduction.  She is using more of her deltoid to abduct her shoulder.  There is also weakness with external rotation.  Reaching behind her with the right shoulder is full and she can reach higher up to her upper thoracic spine where is with the left shoulder reaching behind her she can only reach to her L5 level.  Previous x-rays of left shoulder showed a slight cortical irregularity off of the greater tuberosity suggesting potentially a chronic rotator cuff issue.  At this point a MRI of her left shoulder is warranted to assess for rotator cuff tear given the failure of all forms of conservative treatment.  I will try some Tylenol 3 at night and we will see her back in follow-up after the MRI.  She agrees with this treatment plan.

## 2023-02-25 ENCOUNTER — Other Ambulatory Visit: Payer: Self-pay

## 2023-02-25 DIAGNOSIS — G8929 Other chronic pain: Secondary | ICD-10-CM

## 2023-02-27 ENCOUNTER — Ambulatory Visit: Payer: Medicare Other | Admitting: Orthopaedic Surgery

## 2023-03-08 ENCOUNTER — Ambulatory Visit (HOSPITAL_BASED_OUTPATIENT_CLINIC_OR_DEPARTMENT_OTHER)
Admission: RE | Admit: 2023-03-08 | Discharge: 2023-03-08 | Disposition: A | Discharge status: Home / Self Care | Payer: Medicare Other | Source: Ambulatory Visit | Attending: Orthopaedic Surgery | Admitting: Orthopaedic Surgery

## 2023-03-08 DIAGNOSIS — G8929 Other chronic pain: Secondary | ICD-10-CM | POA: Diagnosis present

## 2023-03-08 DIAGNOSIS — M25512 Pain in left shoulder: Secondary | ICD-10-CM | POA: Insufficient documentation

## 2023-03-26 ENCOUNTER — Ambulatory Visit (INDEPENDENT_AMBULATORY_CARE_PROVIDER_SITE_OTHER): Payer: Medicare Other | Admitting: Orthopaedic Surgery

## 2023-03-26 ENCOUNTER — Encounter: Payer: Self-pay | Admitting: Orthopaedic Surgery

## 2023-03-26 DIAGNOSIS — M75122 Complete rotator cuff tear or rupture of left shoulder, not specified as traumatic: Secondary | ICD-10-CM

## 2023-03-26 NOTE — Progress Notes (Signed)
The patient is a 77 year old female who comes in today with her husband to go over a MRI of her left shoulder.  She has had shoulder weakness and pain for over a year now.  She has problems performing her actives daily living and reaching overhead.  She had had at least 1 subacromial steroid injection and been through some activity modification and strengthening of the shoulder.  It does still wake her up at night as well.  On exam there is still significant weakness of the rotator cuff.  She is abducting and reaching overhead a little better but it still significantly weak.  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  There are some subscapularis tendon tearing as well.  There is significant muscle atrophy and moderate glenohumeral arthritic changes.  We went over the MRI findings and I showed her shoulder model.  I would like to send her to my partner Dr. August Saucer to discuss surgical treatment options.  Her and her husband agree with this referral.

## 2023-04-14 ENCOUNTER — Ambulatory Visit (INDEPENDENT_AMBULATORY_CARE_PROVIDER_SITE_OTHER): Payer: Medicare Other | Admitting: Orthopedic Surgery

## 2023-04-14 DIAGNOSIS — M19212 Secondary osteoarthritis, left shoulder: Secondary | ICD-10-CM

## 2023-04-14 DIAGNOSIS — M25512 Pain in left shoulder: Secondary | ICD-10-CM

## 2023-04-14 DIAGNOSIS — G8929 Other chronic pain: Secondary | ICD-10-CM | POA: Diagnosis not present

## 2023-04-15 ENCOUNTER — Encounter: Payer: Self-pay | Admitting: Orthopedic Surgery

## 2023-04-15 NOTE — Progress Notes (Signed)
Office Visit Note   Kristin [name]: Kristin Kristin [name]           Date of Birth: Mar 10, 1946           MRN: [id number] Visit Date: 04/14/2023 Requested by: No referring provider defined for this encounter. PCP: System, Provider Not In  Subjective: Chief Complaint  Kristin [name] presents with   Left Shoulder - Pain    HPI: Kristin Kristin [name] is a 77 y.o. female who presents to the office reporting left shoulder pain.  She has had pain for over a year.  Last year had some relief with subacromial injection and physical therapy.  In October the pain became severe.  Pain wakes her from sleep at night.  She is left-hand dominant.  Reports decreased range of motion.  Complains of constant pain including scapula and a little bit of neck pain.  Denies any numbness and tingling in her arm and hand.  Right shoulder is okay.  She cannot do any lifting with the left.  She does report some limited range of motion.  Pain does wake her some nights.  She uses tramadol.  Hard for her to lift overhead.  Last hemoglobin A1c 5.9.  She did do well with left total knee replacement.  That in terms of recovery as well as pain management.  She cannot take the oxycodone but she can take Norco.  She cannot take anti-inflammatories due to kidney damage from chemo.  She did have lymph node excision in 1992 for breast cancer.  She anticipates doing physical therapy at [facility] where she lives with her husband..                ROS: All systems reviewed are negative as they relate to the chief complaint within the history of present illness.  Kristin [name] denies fevers or chills.  Assessment & Plan: Visit Diagnoses:  1. Chronic left shoulder pain     Plan: Impression is left shoulder rotator cuff arthropathy with biceps tendinitis.  She is having weakness consistent with rotator cuff arthropathy.  I had a long discussion today about operative and nonoperative treatment options.  She really has had an excellent course of nonoperative  treatment including physical therapy and injections.  This gave her temporary relief but her pain is recurring and is severe.  Interferes with her activities of daily living enough in her dominant arm that she would like to proceed with reverse shoulder replacement.  With the use of models and an explanation of Kristin [name] specific instrumentation the rationale of reverse shoulder replacement is discussed with the Kristin [name].  The risk and benefits include not limited to infection nerve and vessel damage instability as well as incomplete functional restoration and incomplete pain relief are all discussed.  I do think it should be a slightly easier recovery than her knee replacement.  The fact that she did well with the knee replacement is a good prognostic factor for her.  All questions answered.  Plan thin cut CT scan and surgery for sometime within 6 weeks.  Follow-Up Instructions: No follow-ups on file.   Orders:  Orders Placed This Encounter  Procedures   CT SHOULDER LEFT WO CONTRAST   No orders of the defined types were placed in this encounter.     Procedures: No procedures performed   Clinical Data: No additional findings.  Objective: Vital Signs: There were no vitals taken for this visit.  Physical Exam:  Constitutional: Kristin [name] appears well-developed HEENT:  Head: Normocephalic Eyes:EOM are  normal Neck: Normal range of motion Cardiovascular: Normal rate Pulmonary/chest: Effort normal Neurologic: Kristin [name] is alert Skin: Skin is warm Psychiatric: Kristin [name] has normal mood and affect  Ortho Exam: Ortho exam demonstrates passive range of motion on the left of 70/90/150.  She does have weakness to infraspinatus and supraspinatus testing.  Subscap strength is intact.  No Popeye deformity is present but she does have tenderness in the bicipital groove and tenderness with resisted supination on the left.  Deltoid is functional.  She does have some grinding in that left shoulder with passive  range of motion.  Neck range of motion is full.  She has 5 out of 5 grip EPL FPL interosseous resection extension bicep triceps and deltoid strength.  Specialty Comments:  No specialty comments available.  Imaging: No results found.   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/2017   Closed nondisplaced fracture of head of right radius with routine healing 07/10/2017   Chronic pain of left knee 07/10/2017   Acute pain of left knee 07/10/2017   Unilateral primary osteoarthritis, left knee 07/10/2017   Closed nondisplaced fracture of head of right radius 06/09/2017   Past Medical History:  Diagnosis Date   Anemia 1992   Arthritis    oa   Breast CA (HCC) 1992   lumpectomy with chemo and radiation left breast   Chronic kidney disease stage 3    watching creatining levels sees dr nwbu   Diabetes mellitus without complication (HCC)    type 2    GERD (gastroesophageal reflux disease)    Hiatal hernia    High cholesterol    Hypertension    Hypothyroidism    Personal history of radiation therapy    Sleep apnea     Family History  Problem Relation Age of Onset   Breast cancer Mother 26    Past Surgical History:  Procedure Laterality Date   BACK SURGERY     upper and lower upper back has clamp   BREAST LUMPECTOMY Left    CHOLECYSTECTOMY     TONSILLECTOMY     TOTAL KNEE ARTHROPLASTY Left 09/12/2017    Procedure: LEFT TOTAL KNEE ARTHROPLASTY;  Surgeon: Kathryne Hitch, MD;  Location: [facility];  Service: Orthopedics;  Laterality: Left;   TUBAL LIGATION     Social History   Occupational History   Not on file  Tobacco Use   Smoking status: Never   Smokeless tobacco: Never  Vaping Use   Vaping Use: Never used  Substance and Sexual Activity   Alcohol use: Not Currently    Comment: rare   Drug use: No   Sexual activity: Not on file

## 2023-04-24 ENCOUNTER — Ambulatory Visit
Admission: RE | Admit: 2023-04-24 | Discharge: 2023-04-24 | Disposition: A | Discharge status: Home / Self Care | Payer: Medicare Other | Source: Ambulatory Visit | Attending: Orthopedic Surgery | Admitting: Orthopedic Surgery

## 2023-04-24 DIAGNOSIS — G8929 Other chronic pain: Secondary | ICD-10-CM

## 2023-04-30 ENCOUNTER — Other Ambulatory Visit: Payer: Self-pay

## 2023-05-13 NOTE — Progress Notes (Signed)
     Your surgery and Pre-Admission testing visit will be at [facility] located at [address], [place], [place] [address].  Please let all your doctors (i.e., Primary Care Physician, Cardiologist, Endocrinologist, Pulmonologist) know you are having surgery. You may need clearance for surgery. If you are on blood thinners, notify your surgeon and ask the doctor who prescribed them how long to hold them before surgery.  If you have had a heart test, such as an EKG, stress test, heart ultrasound, etc., or lab work performed outside of [facility], please bring copies of these tests to your Pre-Admission testing, if possible.  These departments may contact you before the day of surgery:  Pre-Service Center - insurance/ billing: [phone] Pharmacy- to review your medications: [phone] Pre-Admission Testing- to set an appointment for your visit: [phone]  (Often, these numbers show up as "SPAM" on your phone)  The Pre-Admission Testing (PAT) visit focuses on Anesthesia for your upcoming surgery.  You do NOT need to fast; take your medications as usual. Please arrive 30 minutes early to allow for parking and admitting.  The visit may last up to an hour. Bring a photo ID and medical insurance card. Reschedule if you are sick. ([phone]) and please, NO children under age 16 at the visit.  During the PAT visit:  We will review your medical and surgical history.   You will receive pre-operative instructions, including the time of arrival at the hospital and surgical start time.  We will review what medication(s) you can take on the day of surgery.  After speaking with the nurse, you will have blood drawn and, if needed, a chest x-ray and EKG.  Most lab results from your doctor are good for 30 days, Hemoglobin A1C is good for 60 days. If you cannot talk to the Pharmacy, bring your medications or a list of them to the PST visit.   Infection control for the [facility] requires: All fingernail and toenail products should be removed before the day of surgery.  (SNS, Acrylic, Gel, Polish, Stickers, Press on, and Poly gel nails.)   Parking information:  Address: [facility] - [address], [place], [place] [address]  Please look for signs for entrance A off of [address]. Free valet parking is available Monday-Friday 05:30am-06:00pm     

## 2023-05-16 NOTE — Pre-Procedure Instructions (Signed)
Surgical Instructions    Your procedure is scheduled on May 27, 2023.  Report to [facility] Main Entrance "A" at 5:30 A.M., then check in with the Admitting office.  Call this number if you have problems the morning of surgery:  [phone]  If you have any questions prior to your surgery date call [phone]: Open Monday-Friday 8am-4pm If you experience any cold or flu symptoms such as cough, fever, chills, shortness of breath, etc. between now and your scheduled surgery, please notify us at the above number.     Remember:  Do not eat after midnight the night before your surgery  You may drink clear liquids until 4:30 AM the morning of your surgery.   Clear liquids allowed are: Water, Non-Citrus Juices (without pulp), Carbonated Beverages, Clear Tea, Black Coffee Only (NO MILK, CREAM OR POWDERED CREAMER of any kind), and Gatorade.  Patient Instructions  The night before surgery:  No food after midnight. ONLY clear liquids after midnight  The day of surgery (if you have diabetes): Drink ONE (1) 12 oz G2 given to you in your pre admission testing appointment by 4:30 AM the morning of surgery. Drink in one sitting. Do not sip.  This drink was given to you during your hospital  pre-op appointment visit.  Nothing else to drink after completing the  12 oz bottle of G2.         If you have questions, please contact your surgeon's office.     Take these medicines the morning of surgery with A SIP OF WATER:  famotidine (PEPCID)   hydrALAZINE (APRESOLINE)   isosorbide mononitrate (IMDUR)   metoprolol succinate (TOPROL-XL)   SYNTHROID    May take these medicines IF NEEDED:  fluticasone (FLONASE) nasal spray   Polyethyl Glycol-Propyl Glycol (SYSTANE) eye drops  traMADol (ULTRAM)    Follow your surgeon's instructions on when to stop Aspirin.  If no instructions were given by your surgeon then you will need to call the office to get those instructions.     As of today, STOP  taking any Aleve, Naproxen, Ibuprofen, Motrin, Advil, Goody's, BC's, all herbal medications, fish oil, and all vitamins.   WHAT DO I DO ABOUT MY DIABETES MEDICATION?   STOP taking your JARDIANCE three days prior to surgery. Your last dose will be June 14th.     STOP taking your OZEMPIC one week prior to surgery.    HOW TO MANAGE YOUR DIABETES BEFORE AND AFTER SURGERY  Why is it important to control my blood sugar before and after surgery? Improving blood sugar levels before and after surgery helps healing and can limit problems. A way of improving blood sugar control is eating a healthy diet by:  Eating less sugar and carbohydrates  Increasing activity/exercise  Talking with your doctor about reaching your blood sugar goals High blood sugars (greater than 180 mg/dL) can raise your risk of infections and slow your recovery, so you will need to focus on controlling your diabetes during the weeks before surgery. Make sure that the doctor who takes care of your diabetes knows about your planned surgery including the date and location.  How do I manage my blood sugar before surgery? Check your blood sugar at least 4 times a day, starting 2 days before surgery, to make sure that the level is not too high or low.  Check your blood sugar the morning of your surgery when you wake up and every 2 hours until you get to the Short Stay  unit.  If your blood sugar is less than 70 mg/dL, you will need to treat for low blood sugar: Do not take insulin. Treat a low blood sugar (less than 70 mg/dL) with  cup of clear juice (cranberry or apple), 4 glucose tablets, OR glucose gel. Recheck blood sugar in 15 minutes after treatment (to make sure it is greater than 70 mg/dL). If your blood sugar is not greater than 70 mg/dL on recheck, call [phone] for further instructions. Report your blood sugar to the short stay nurse when you get to Short Stay.  If you are admitted to the hospital after  surgery: Your blood sugar will be checked by the staff and you will probably be given insulin after surgery (instead of oral diabetes medicines) to make sure you have good blood sugar levels. The goal for blood sugar control after surgery is 80-180 mg/dL.                      Do NOT Smoke (Tobacco/Vaping) for 24 hours prior to your procedure.  If you use a CPAP at night, you may bring your mask/headgear for your overnight stay.   Contacts, glasses, piercing's, hearing aid's, dentures or partials may not be worn into surgery, please bring cases for these belongings.    For patients admitted to the hospital, discharge time will be determined by your treatment team.   Patients discharged the day of surgery will not be allowed to drive home, and someone needs to stay with them for 24 hours.  SURGICAL WAITING ROOM VISITATION Patients having surgery or a procedure may have no more than 2 support people in the waiting area - these visitors may rotate.   Children under the age of 110 must have an adult with them who is not the patient. If the patient needs to stay at the hospital during part of their recovery, the visitor guidelines for inpatient rooms apply. Pre-op nurse will coordinate an appropriate time for 1 support person to accompany patient in pre-op.  This support person may not rotate.   Please refer to the [facility] website for the visitor guidelines for Inpatients (after your surgery is over and you are in a regular room).   If you received a COVID test during your pre-op visit  it is requested that you wear a mask when out in public, stay away from anyone that may not be feeling well and notify your surgeon if you develop symptoms. If you have been in contact with anyone that has tested positive in the last 10 days please notify you surgeon.    Pre-operative 5 CHG Bath Instructions   You can play a key role in reducing the risk of infection after surgery. Your skin needs to be as  free of germs as possible. You can reduce the number of germs on your skin by washing with CHG (chlorhexidine gluconate) soap before surgery. CHG is an antiseptic soap that kills germs and continues to kill germs even after washing.   DO NOT use if you have an allergy to chlorhexidine/CHG or antibacterial soaps. If your skin becomes reddened or irritated, stop using the CHG and notify one of our RNs at [phone].   Please shower with the CHG soap starting 4 days before surgery using the following schedule:     Please keep in mind the following:  DO NOT shave, including legs and underarms, starting the day of your first shower.   You may shave your face  at any point before/day of surgery.  Place clean sheets on your bed the day you start using CHG soap. Use a clean washcloth (not used since being washed) for each shower. DO NOT sleep with pets once you start using the CHG.   CHG Shower Instructions:  If you choose to wash your hair and private area, wash first with your normal shampoo/soap.  After you use shampoo/soap, rinse your hair and body thoroughly to remove shampoo/soap residue.  Turn the water OFF and apply about 3 tablespoons (45 ml) of CHG soap to a CLEAN washcloth.  Apply CHG soap ONLY FROM YOUR NECK DOWN TO YOUR TOES (washing for 3-5 minutes)  DO NOT use CHG soap on face, private areas, open wounds, or sores.  Pay special attention to the area where your surgery is being performed.  If you are having back surgery, having someone wash your back for you may be helpful. Wait 2 minutes after CHG soap is applied, then you may rinse off the CHG soap.  Pat dry with a clean towel  Put on clean clothes/pajamas   If you choose to wear lotion, please use ONLY the CHG-compatible lotions on the back of this paper.     Additional instructions for the day of surgery: DO NOT APPLY any lotions, deodorants, cologne, or perfumes.   Do not wear jewelry or makeup Do not wear nail polish, gel  polish, artificial nails, or any other type of covering on natural nails (fingers and toes) Do not bring valuables to the hospital. [facility] is not responsible for any belongings or valuables. Put on clean/comfortable clothes.  Brush your teeth.  Ask your nurse before applying any prescription medications to the skin.      CHG Compatible Lotions   Aveeno Moisturizing lotion  Cetaphil Moisturizing Cream  Cetaphil Moisturizing Lotion  Clairol Herbal Essence Moisturizing Lotion, Dry Skin  Clairol Herbal Essence Moisturizing Lotion, Extra Dry Skin  Clairol Herbal Essence Moisturizing Lotion, Normal Skin  Curel Age Defying Therapeutic Moisturizing Lotion with Alpha Hydroxy  Curel Extreme Care Body Lotion  Curel Soothing Hands Moisturizing Hand Lotion  Curel Therapeutic Moisturizing Cream, Fragrance-Free  Curel Therapeutic Moisturizing Lotion, Fragrance-Free  Curel Therapeutic Moisturizing Lotion, Original Formula  Eucerin Daily Replenishing Lotion  Eucerin Dry Skin Therapy Plus Alpha Hydroxy Crme  Eucerin Dry Skin Therapy Plus Alpha Hydroxy Lotion  Eucerin Original Crme  Eucerin Original Lotion  Eucerin Plus Crme Eucerin Plus Lotion  Eucerin TriLipid Replenishing Lotion  Keri Anti-Bacterial Hand Lotion  Keri Deep Conditioning Original Lotion Dry Skin Formula Softly Scented  Keri Deep Conditioning Original Lotion, Fragrance Free Sensitive Skin Formula  Keri Lotion Fast Absorbing Fragrance Free Sensitive Skin Formula  Keri Lotion Fast Absorbing Softly Scented Dry Skin Formula  Keri Original Lotion  Keri Skin Renewal Lotion Keri Silky Smooth Lotion  Keri Silky Smooth Sensitive Skin Lotion  Nivea Body Creamy Conditioning Oil  Nivea Body Extra Enriched Lotion  Nivea Body Original Lotion  Nivea Body Sheer Moisturizing Lotion Nivea Crme  Nivea Skin Firming Lotion  NutraDerm 30 Skin Lotion  NutraDerm Skin Lotion  NutraDerm Therapeutic Skin Cream  NutraDerm Therapeutic Skin  Lotion  ProShield Protective Hand Cream  Provon moisturizing lotion   Please read over the following fact sheets that you were given.

## 2023-05-19 ENCOUNTER — Other Ambulatory Visit: Payer: Self-pay

## 2023-05-19 ENCOUNTER — Encounter (HOSPITAL_COMMUNITY): Payer: Self-pay

## 2023-05-19 ENCOUNTER — Encounter (HOSPITAL_COMMUNITY)
Admission: RE | Admit: 2023-05-19 | Discharge: 2023-05-19 | Disposition: A | Discharge status: Home / Self Care | Payer: Medicare Other | Source: Ambulatory Visit | Attending: Orthopedic Surgery | Admitting: Orthopedic Surgery

## 2023-05-19 VITALS — BP 135/64 | HR 77 | Temp 98.3°F | Resp 18 | Ht 64.0 in | Wt 238.8 lb

## 2023-05-19 DIAGNOSIS — Z01812 Encounter for preprocedural laboratory examination: Secondary | ICD-10-CM | POA: Insufficient documentation

## 2023-05-19 DIAGNOSIS — E1122 Type 2 diabetes mellitus with diabetic chronic kidney disease: Secondary | ICD-10-CM | POA: Diagnosis not present

## 2023-05-19 DIAGNOSIS — Z9221 Personal history of antineoplastic chemotherapy: Secondary | ICD-10-CM | POA: Diagnosis not present

## 2023-05-19 DIAGNOSIS — I340 Nonrheumatic mitral (valve) insufficiency: Secondary | ICD-10-CM | POA: Insufficient documentation

## 2023-05-19 DIAGNOSIS — N183 Chronic kidney disease, stage 3 unspecified: Secondary | ICD-10-CM | POA: Diagnosis not present

## 2023-05-19 DIAGNOSIS — I429 Cardiomyopathy, unspecified: Secondary | ICD-10-CM | POA: Diagnosis not present

## 2023-05-19 DIAGNOSIS — Z7985 Long-term (current) use of injectable non-insulin antidiabetic drugs: Secondary | ICD-10-CM | POA: Insufficient documentation

## 2023-05-19 DIAGNOSIS — Z923 Personal history of irradiation: Secondary | ICD-10-CM | POA: Insufficient documentation

## 2023-05-19 DIAGNOSIS — G4733 Obstructive sleep apnea (adult) (pediatric): Secondary | ICD-10-CM | POA: Diagnosis not present

## 2023-05-19 DIAGNOSIS — Z79899 Other long term (current) drug therapy: Secondary | ICD-10-CM | POA: Diagnosis not present

## 2023-05-19 DIAGNOSIS — Z853 Personal history of malignant neoplasm of breast: Secondary | ICD-10-CM | POA: Insufficient documentation

## 2023-05-19 DIAGNOSIS — I129 Hypertensive chronic kidney disease with stage 1 through stage 4 chronic kidney disease, or unspecified chronic kidney disease: Secondary | ICD-10-CM | POA: Diagnosis not present

## 2023-05-19 DIAGNOSIS — Z01818 Encounter for other preprocedural examination: Secondary | ICD-10-CM

## 2023-05-19 LAB — CBC
HCT: 37.9 % (ref 36.0–46.0)
Hemoglobin: 12.5 g/dL (ref 12.0–15.0)
MCH: 33.1 pg (ref 26.0–34.0)
MCHC: 33 g/dL (ref 30.0–36.0)
MCV: 100.3 fL — ABNORMAL HIGH (ref 80.0–100.0)
Platelets: 238 10*3/uL (ref 150–400)
RBC: 3.78 MIL/uL — ABNORMAL LOW (ref 3.87–5.11)
RDW: 13.2 % (ref 11.5–15.5)
WBC: 8 10*3/uL (ref 4.0–10.5)
nRBC: 0 % (ref 0.0–0.2)

## 2023-05-19 LAB — URINALYSIS, W/ REFLEX TO CULTURE (INFECTION SUSPECTED)
Bacteria, UA: NONE SEEN
Bilirubin Urine: NEGATIVE
Glucose, UA: >=500 mg/dL — AB
Hgb urine dipstick: NEGATIVE
Ketones, ur: NEGATIVE mg/dL
Leukocytes,Ua: NEGATIVE
Nitrite: NEGATIVE
Protein, ur: NEGATIVE mg/dL
Specific Gravity, Urine: 1.005 (ref 1.005–1.030)
pH: 5 (ref 5.0–8.0)

## 2023-05-19 LAB — BASIC METABOLIC PANEL
Anion gap: 10 (ref 5–15)
BUN: 40 mg/dL — ABNORMAL HIGH (ref 8–23)
CO2: 23 mmol/L (ref 22–32)
Calcium: 9.1 mg/dL (ref 8.9–10.3)
Chloride: 103 mmol/L (ref 98–111)
Creatinine, Ser: 1.74 mg/dL — ABNORMAL HIGH (ref 0.44–1.00)
GFR, Estimated: 30 mL/min — ABNORMAL LOW (ref >60)
Glucose, Bld: 125 mg/dL — ABNORMAL HIGH (ref 70–99)
Potassium: 4.4 mmol/L (ref 3.5–5.1)
Sodium: 136 mmol/L (ref 135–145)

## 2023-05-19 LAB — HEMOGLOBIN A1C
Hgb A1c MFr Bld: 5.9 % — ABNORMAL HIGH (ref 4.8–5.6)
Mean Plasma Glucose: 122.63 mg/dL

## 2023-05-19 LAB — SURGICAL PCR SCREEN
MRSA, PCR: NEGATIVE
Staphylococcus aureus: NEGATIVE

## 2023-05-19 LAB — GLUCOSE, CAPILLARY: Glucose-Capillary: 130 mg/dL — ABNORMAL HIGH (ref 70–99)

## 2023-05-19 NOTE — Progress Notes (Signed)
PCP - Bill Salinas, NP Cardiologist - Dr. Bertram Gala (Last office visit Jan. 2024. Next visit July 2024)  PPM/ICD - Denies Device Orders - n/a Rep Notified - n/a  Chest x-ray - Denies EKG - 08/25/2022 Stress Test - Per pt, many years ago before she moved here ECHO - 10/2022 TEE Cardiac Cath - Left cath 01/29/2022  Sleep Study - +OSA. Pt wears CPAP nightly. Pressure setting is 8  Pt is currently checking blood sugar 2x/day, but she normally has CGM on. Normal fasting range is around 100. CBG at pre-op 130. She had protein chake for breakfast. A1c result pending.  Last dose of GLP1 agonist- Last dose of Ozempic 05/17/23. GLP1 instructions: Pt will continue to hold medication until after surgery  Blood Thinner Instructions: n/a Aspirin Instructions: Pts last dose of ASA was 05/17/23.  ERAS Protcol - Clear liquids until 0430 morning of surgery PRE-SURGERY Ensure or G2- G2 given to pt with instructions  COVID TEST- n/a   Anesthesia review: Yes. Cardiomyopathy with EF 45%, CKD3, DM and OSA with CPAP.   Patient denies shortness of breath, fever, cough and chest pain at PAT appointment. Pt denies any respiratory illness/infection in the last two months.   All instructions explained to the patient, with a verbal understanding of the material. Patient agrees to go over the instructions while at home for a better understanding. Patient also instructed to self quarantine after being tested for COVID-19. The opportunity to ask questions was provided.

## 2023-05-20 NOTE — Progress Notes (Signed)
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  Most recent echo November 2023 showed EF 45%, mild to mod MR. She is maintained on metoprolol succinate 50 mg daily, lisinopril 20 mg daily, dapagliflozin 10 mg daily, hydralazine 75 mg twice daily and Imdur 30 mg daily. Last seen by Dr. Karren Burly 12/19/22. Per note, "Moderate mitral regurgitation appears to be functional secondary to her cardiomyopathy. Stable on TTE November 2023. Optimize GDMT recheck TTE Nov 2024."  History of CKD 3.  Non-insulin-dependent DM2, A1c 5.9 on preop labs.  She is on once weekly GLP-1 agonist Ozempic, last dose 05/17/2023.  History of left breast cancer s/p lumpectomy and radiation 1992.  Preop labs reviewed, creatinine elevated 1.74 consistent with history of CKD 3, otherwise unremarkable.  EKG 08/25/2022 (in setting of ED admission for diarrheal illness with AKI on CKD): Junctional tachycardia.  Rate 103.  IVCD, consider atypical right bundle branch block.  LVH with IVCD, LAD and secondary repolarization abnormalities.  Prolonged QT.  When compared to prior, similar appearance with known right bundle branch block.  TTE 11/14/2022 (Care Everywhere): The left ventricular size is normal. There is mild concentric left ventricular  hypertrophy. There is mild global hypokinesis of the left ventricle. Left  ventricular systolic function is mildly reduced. LV ejection fraction = 45%.  Left ventricular filling pattern is indeterminate.  The right ventricle is normal in size and function.  The left atrium is severely dilated.  There is mild to moderate mitral regurgitation.  There was insufficient TR detected to calculate RV systolic pressure.  The inferior vena cava was not visualized during the exam.  The aortic sinus is normal size.   Cardiac cath 01/29/2022 (Care Everywhere): DIAGNOSTIC FINDINGS      1.  Normal coronary arteriogram.  Right dominant system.  2.  Normal LVEDP, 13 mmHg.  A ventriculogram was not performed in order to  reduce contrast exposure in this patient with renal insufficiency.  3.  Low normal cardiac output, cardiac output 4.4 L-min.  Cardiac index  2.1  4. High-normal pulmonary artery pressure, 36-17 mmHg, mean 24 mmHg.     Zannie [facility] Short Stay Center/Anesthesiology Phone [phone] 05/20/2023 3:31 PM

## 2023-05-20 NOTE — Anesthesia Preprocedure Evaluation (Addendum)
Anesthesia Evaluation  Patient identified by MRN, date of birth, ID band Patient awake    Reviewed: Allergy & Precautions, NPO status , Patient's Chart, lab work & pertinent test results  Airway Mallampati: III  TM Distance: <3 FB Neck ROM: Full    Dental no notable dental hx.    Pulmonary sleep apnea    Pulmonary exam normal        Cardiovascular hypertension, Pt. on medications and Pt. on home beta blockers + dysrhythmias  Rhythm:Regular Rate:Normal     Neuro/Psych negative neurological ROS  negative psych ROS   GI/Hepatic Neg liver ROS, hiatal hernia,GERD  ,,  Endo/Other  diabetes, Type 2, Oral Hypoglycemic AgentsHypothyroidism    Renal/GU CRFRenal disease     Musculoskeletal  (+) Arthritis , Osteoarthritis,    Abdominal Normal abdominal exam  (+)   Peds  Hematology  (+) Blood dyscrasia, anemia Lab Results      Component                Value               Date                      WBC                      8.0                 05/19/2023                HGB                      12.5                05/19/2023                HCT                      37.9                05/19/2023                MCV                      100.3 (H)           05/19/2023                PLT                      238                 05/19/2023              Anesthesia Other Findings   Reproductive/Obstetrics                             Anesthesia Physical Anesthesia Plan  ASA: 3  Anesthesia Plan: General and Regional   Post-op Pain Management: Regional block*   Induction: Intravenous  PONV Risk Score and Plan: 3 and Ondansetron, Dexamethasone and Treatment may vary due to age or medical condition  Airway Management Planned: Mask and Oral ETT  Additional Equipment: None  Intra-op Plan:   Post-operative Plan: Extubation in OR  Informed Consent: I have reviewed the patients History and Physical, chart,  labs and discussed the procedure including the risks,  benefits and alternatives for the proposed anesthesia with the patient or authorized representative who has indicated his/her understanding and acceptance.     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  Most recent echo November 2023 showed EF 45%, mild to mod MR. She is maintained on metoprolol succinate 50 mg daily, lisinopril 20 mg daily, dapagliflozin 10 mg daily, hydralazine 75 mg twice daily and Imdur 30 mg daily. Last seen by Dr. Karren Burly 12/19/22. Per note, "Moderate mitral regurgitation appears to be functional secondary to her cardiomyopathy. Stable on TTE November 2023. Optimize GDMT recheck TTE Nov 2024."  History of CKD 3.  Non-insulin-dependent DM2, A1c 5.9 on preop labs.  She is on once weekly GLP-1 agonist Ozempic, last dose 05/17/2023.  History of left breast cancer s/p lumpectomy and radiation 1992.  Preop labs reviewed, creatinine elevated 1.74 consistent with history of CKD 3, otherwise unremarkable.  EKG 08/25/2022 (in setting of ED admission for diarrheal illness with AKI on CKD): Junctional tachycardia.  Rate 103.  IVCD, consider atypical right bundle branch block.  LVH with IVCD, LAD and secondary repolarization abnormalities.  Prolonged QT.  When compared to prior, similar appearance with known right bundle branch block.  TTE 11/14/2022 (Care Everywhere): The left ventricular size is normal. There is mild concentric left ventricular  hypertrophy. There is mild global hypokinesis of the left ventricle. Left  ventricular systolicfunction is mildly reduced. LV ejection fraction = 45%.  Left ventricular filling pattern is indeterminate.  The right ventricle is normal in size and function.  The left atrium is  severely dilated.  There is mild to moderate mitral regurgitation.  There was insufficient TR detected to calculate RV systolic pressure.  The inferior vena cava was not visualized during the exam.  The aortic sinus is normal size.   Cardiac cath 01/29/2022 (Care Everywhere): DIAGNOSTIC FINDINGS   1. Normal coronary arteriogram. Right dominant system.  2. Normal LVEDP, 13 mmHg. A ventriculogram was not performed in order to  reduce contrast exposure in this patient with renal insufficiency.  3. Low normal cardiac output, cardiac output 4.4 L-min. Cardiac index  2.1  4.High-normal pulmonary artery pressure, 36-17 mmHg, mean 24 mmHg.   )        Anesthesia Quick Evaluation

## 2023-05-27 ENCOUNTER — Other Ambulatory Visit: Payer: Self-pay

## 2023-05-27 ENCOUNTER — Observation Stay (HOSPITAL_COMMUNITY)
Admission: RE | Admit: 2023-05-27 | Discharge: 2023-05-28 | Disposition: A | Discharge status: Home / Self Care | Payer: Medicare Other | Source: Ambulatory Visit | Attending: Orthopedic Surgery | Admitting: Orthopedic Surgery

## 2023-05-27 ENCOUNTER — Ambulatory Visit (HOSPITAL_BASED_OUTPATIENT_CLINIC_OR_DEPARTMENT_OTHER): Payer: Medicare Other

## 2023-05-27 ENCOUNTER — Observation Stay (HOSPITAL_COMMUNITY): Payer: Medicare Other

## 2023-05-27 ENCOUNTER — Encounter (HOSPITAL_COMMUNITY)
Admission: RE | Disposition: A | Discharge status: Home / Self Care | Payer: Self-pay | Source: Ambulatory Visit | Attending: Orthopedic Surgery

## 2023-05-27 ENCOUNTER — Ambulatory Visit (HOSPITAL_COMMUNITY): Payer: Medicare Other | Admitting: Vascular Surgery

## 2023-05-27 DIAGNOSIS — Z7982 Long term (current) use of aspirin: Secondary | ICD-10-CM | POA: Diagnosis not present

## 2023-05-27 DIAGNOSIS — N183 Chronic kidney disease, stage 3 unspecified: Secondary | ICD-10-CM | POA: Diagnosis not present

## 2023-05-27 DIAGNOSIS — Z96652 Presence of left artificial knee joint: Secondary | ICD-10-CM | POA: Insufficient documentation

## 2023-05-27 DIAGNOSIS — M7522 Bicipital tendinitis, left shoulder: Secondary | ICD-10-CM

## 2023-05-27 DIAGNOSIS — Z79899 Other long term (current) drug therapy: Secondary | ICD-10-CM | POA: Diagnosis not present

## 2023-05-27 DIAGNOSIS — E1122 Type 2 diabetes mellitus with diabetic chronic kidney disease: Secondary | ICD-10-CM | POA: Diagnosis not present

## 2023-05-27 DIAGNOSIS — E039 Hypothyroidism, unspecified: Secondary | ICD-10-CM | POA: Insufficient documentation

## 2023-05-27 DIAGNOSIS — Z96612 Presence of left artificial shoulder joint: Secondary | ICD-10-CM

## 2023-05-27 DIAGNOSIS — M19012 Primary osteoarthritis, left shoulder: Secondary | ICD-10-CM

## 2023-05-27 DIAGNOSIS — M75102 Unspecified rotator cuff tear or rupture of left shoulder, not specified as traumatic: Secondary | ICD-10-CM | POA: Insufficient documentation

## 2023-05-27 DIAGNOSIS — M12812 Other specific arthropathies, not elsewhere classified, left shoulder: Secondary | ICD-10-CM

## 2023-05-27 DIAGNOSIS — Z01818 Encounter for other preprocedural examination: Secondary | ICD-10-CM

## 2023-05-27 DIAGNOSIS — D638 Anemia in other chronic diseases classified elsewhere: Secondary | ICD-10-CM | POA: Diagnosis not present

## 2023-05-27 DIAGNOSIS — I129 Hypertensive chronic kidney disease with stage 1 through stage 4 chronic kidney disease, or unspecified chronic kidney disease: Secondary | ICD-10-CM | POA: Diagnosis not present

## 2023-05-27 HISTORY — PX: BICEPT TENODESIS: SHX5116

## 2023-05-27 HISTORY — PX: REVERSE SHOULDER ARTHROPLASTY: SHX5054

## 2023-05-27 LAB — GLUCOSE, CAPILLARY
Glucose-Capillary: 134 mg/dL — ABNORMAL HIGH (ref 70–99)
Glucose-Capillary: 139 mg/dL — ABNORMAL HIGH (ref 70–99)
Glucose-Capillary: 176 mg/dL — ABNORMAL HIGH (ref 70–99)
Glucose-Capillary: 154 mg/dL — ABNORMAL HIGH (ref 70–99)

## 2023-05-27 SURGERY — ARTHROPLASTY, SHOULDER, TOTAL, REVERSE
Anesthesia: Regional | Site: Shoulder | Laterality: Left

## 2023-05-27 MED ORDER — CHLORHEXIDINE GLUCONATE 0.12 % MT SOLN
OROMUCOSAL | Status: AC
  Administered 2023-05-27: 15 mL via OROMUCOSAL
  Filled 2023-05-27: qty 15

## 2023-05-27 MED ORDER — BUPIVACAINE LIPOSOME 1.3 % IJ SUSP
INTRAMUSCULAR | Status: DC | PRN
  Administered 2023-05-27: 10 mL via PERINEURAL

## 2023-05-27 MED ORDER — ONDANSETRON HCL 4 MG/2ML IJ SOLN
INTRAMUSCULAR | Status: AC
  Filled 2023-05-27: qty 2

## 2023-05-27 MED ORDER — MELATONIN 5 MG PO TABS
10.0000 mg | ORAL_TABLET | Freq: Every evening | ORAL | Status: DC | PRN
  Administered 2023-05-28: 10 mg via ORAL
  Filled 2023-05-27: qty 2

## 2023-05-27 MED ORDER — FENTANYL CITRATE (PF) 100 MCG/2ML IJ SOLN: 25.0000 ug | INTRAMUSCULAR | Status: DC | PRN

## 2023-05-27 MED ORDER — METHOCARBAMOL 1000 MG/10ML IJ SOLN: 500.0000 mg | Freq: Four times a day (QID) | INTRAVENOUS | Status: DC | PRN

## 2023-05-27 MED ORDER — TRANEXAMIC ACID-NACL 1000-0.7 MG/100ML-% IV SOLN
1000.0000 mg | INTRAVENOUS | Status: AC
  Administered 2023-05-27: 1000 mg via INTRAVENOUS
  Filled 2023-05-27: qty 100

## 2023-05-27 MED ORDER — DEXAMETHASONE SODIUM PHOSPHATE 10 MG/ML IJ SOLN
INTRAMUSCULAR | Status: DC | PRN
  Administered 2023-05-27: 10 mg via INTRAVENOUS

## 2023-05-27 MED ORDER — ASPIRIN 81 MG PO TBEC
81.0000 mg | DELAYED_RELEASE_TABLET | Freq: Every day | ORAL | Status: DC
  Administered 2023-05-27: 81 mg via ORAL
  Filled 2023-05-27: qty 1

## 2023-05-27 MED ORDER — ROCURONIUM BROMIDE 10 MG/ML (PF) SYRINGE
PREFILLED_SYRINGE | INTRAVENOUS | Status: AC
  Filled 2023-05-27: qty 10

## 2023-05-27 MED ORDER — PHENYLEPHRINE 80 MCG/ML (10ML) SYRINGE FOR IV PUSH (FOR BLOOD PRESSURE SUPPORT)
PREFILLED_SYRINGE | INTRAVENOUS | Status: DC | PRN
  Administered 2023-05-27: 80 ug via INTRAVENOUS
  Administered 2023-05-27: 160 ug via INTRAVENOUS

## 2023-05-27 MED ORDER — PHENYLEPHRINE HCL-NACL 20-0.9 MG/250ML-% IV SOLN
INTRAVENOUS | Status: DC | PRN
  Administered 2023-05-27: 20 ug/min via INTRAVENOUS

## 2023-05-27 MED ORDER — IRRISEPT - 450ML BOTTLE WITH 0.05% CHG IN STERILE WATER, USP 99.95% OPTIME
TOPICAL | Status: DC | PRN
  Administered 2023-05-27: 450 mL via TOPICAL

## 2023-05-27 MED ORDER — PHENYLEPHRINE HCL-NACL 20-0.9 MG/250ML-% IV SOLN
INTRAVENOUS | Status: AC
  Filled 2023-05-27: qty 250

## 2023-05-27 MED ORDER — METOCLOPRAMIDE HCL 5 MG PO TABS: 5.0000 mg | ORAL_TABLET | Freq: Three times a day (TID) | ORAL | Status: DC | PRN

## 2023-05-27 MED ORDER — INSULIN ASPART 100 UNIT/ML IJ SOLN: 0.0000 [IU] | Freq: Every day | INTRAMUSCULAR | Status: DC

## 2023-05-27 MED ORDER — ONDANSETRON HCL 4 MG/2ML IJ SOLN
INTRAMUSCULAR | Status: DC | PRN
  Administered 2023-05-27: 4 mg via INTRAVENOUS

## 2023-05-27 MED ORDER — FENTANYL CITRATE (PF) 250 MCG/5ML IJ SOLN
INTRAMUSCULAR | Status: AC
  Filled 2023-05-27: qty 5

## 2023-05-27 MED ORDER — FENTANYL CITRATE (PF) 250 MCG/5ML IJ SOLN
INTRAMUSCULAR | Status: DC | PRN
  Administered 2023-05-27 (×2): 50 ug via INTRAVENOUS
  Administered 2023-05-27: 25 ug via INTRAVENOUS
  Administered 2023-05-27: 50 ug via INTRAVENOUS
  Administered 2023-05-27: 25 ug via INTRAVENOUS

## 2023-05-27 MED ORDER — PROPOFOL 10 MG/ML IV BOLUS
INTRAVENOUS | Status: AC
  Filled 2023-05-27: qty 20

## 2023-05-27 MED ORDER — METOPROLOL SUCCINATE ER 50 MG PO TB24: 50.0000 mg | ORAL_TABLET | Freq: Every day | ORAL | Status: DC

## 2023-05-27 MED ORDER — PHENOL 1.4 % MT LIQD: 1.0000 | OROMUCOSAL | Status: DC | PRN

## 2023-05-27 MED ORDER — PROPOFOL 10 MG/ML IV BOLUS
INTRAVENOUS | Status: DC | PRN
  Administered 2023-05-27: 130 mg via INTRAVENOUS

## 2023-05-27 MED ORDER — ROCURONIUM BROMIDE 10 MG/ML (PF) SYRINGE
PREFILLED_SYRINGE | INTRAVENOUS | Status: DC | PRN
  Administered 2023-05-27: 50 mg via INTRAVENOUS

## 2023-05-27 MED ORDER — EPHEDRINE SULFATE-NACL 50-0.9 MG/10ML-% IV SOSY
PREFILLED_SYRINGE | INTRAVENOUS | Status: DC | PRN
  Administered 2023-05-27 (×2): 5 mg via INTRAVENOUS
  Administered 2023-05-27: 7.5 mg via INTRAVENOUS

## 2023-05-27 MED ORDER — LACTATED RINGERS IV SOLN: INTRAVENOUS | Status: DC

## 2023-05-27 MED ORDER — BUPIVACAINE HCL (PF) 0.5 % IJ SOLN
INTRAMUSCULAR | Status: DC | PRN
  Administered 2023-05-27: 15 mL via PERINEURAL

## 2023-05-27 MED ORDER — GABAPENTIN 300 MG PO CAPS
600.0000 mg | ORAL_CAPSULE | Freq: Every day | ORAL | Status: DC
  Administered 2023-05-27: 600 mg via ORAL
  Filled 2023-05-27: qty 2

## 2023-05-27 MED ORDER — GLYCOPYRROLATE PF 0.2 MG/ML IJ SOSY
PREFILLED_SYRINGE | INTRAMUSCULAR | Status: AC
  Filled 2023-05-27: qty 1

## 2023-05-27 MED ORDER — HYDRALAZINE HCL 50 MG PO TABS
75.0000 mg | ORAL_TABLET | Freq: Two times a day (BID) | ORAL | Status: DC
  Administered 2023-05-27: 75 mg via ORAL
  Filled 2023-05-27 (×2): qty 1

## 2023-05-27 MED ORDER — LIDOCAINE 2% (20 MG/ML) 5 ML SYRINGE
INTRAMUSCULAR | Status: AC
  Filled 2023-05-27: qty 5

## 2023-05-27 MED ORDER — FUROSEMIDE 20 MG PO TABS: 10.0000 mg | ORAL_TABLET | Freq: Every day | ORAL | Status: DC | PRN

## 2023-05-27 MED ORDER — GLYCOPYRROLATE 0.2 MG/ML IJ SOLN
INTRAMUSCULAR | Status: DC | PRN
  Administered 2023-05-27: .2 mg via INTRAVENOUS

## 2023-05-27 MED ORDER — ACETAMINOPHEN 10 MG/ML IV SOLN: 1000.0000 mg | Freq: Once | INTRAVENOUS | Status: DC | PRN

## 2023-05-27 MED ORDER — VANCOMYCIN HCL 1000 MG IV SOLR
INTRAVENOUS | Status: DC | PRN
  Administered 2023-05-27: 1000 mg

## 2023-05-27 MED ORDER — ACETAMINOPHEN 500 MG PO TABS
500.0000 mg | ORAL_TABLET | Freq: Four times a day (QID) | ORAL | Status: AC
  Administered 2023-05-27 – 2023-05-28 (×4): 500 mg via ORAL
  Filled 2023-05-27 (×4): qty 1

## 2023-05-27 MED ORDER — SUGAMMADEX SODIUM 200 MG/2ML IV SOLN
INTRAVENOUS | Status: DC | PRN
  Administered 2023-05-27: 100 mg via INTRAVENOUS

## 2023-05-27 MED ORDER — CALCIUM CARBONATE 1250 (500 CA) MG PO TABS
0.5000 | ORAL_TABLET | Freq: Two times a day (BID) | ORAL | Status: DC
  Administered 2023-05-27 – 2023-05-28 (×2): 625 mg via ORAL
  Filled 2023-05-27 (×2): qty 1

## 2023-05-27 MED ORDER — DOCUSATE SODIUM 100 MG PO CAPS
100.0000 mg | ORAL_CAPSULE | Freq: Two times a day (BID) | ORAL | Status: DC
  Administered 2023-05-27 (×2): 100 mg via ORAL
  Filled 2023-05-27 (×2): qty 1

## 2023-05-27 MED ORDER — POVIDONE-IODINE 7.5 % EX SOLN
Freq: Once | CUTANEOUS | Status: AC
  Filled 2023-05-27: qty 118

## 2023-05-27 MED ORDER — POVIDONE-IODINE 10 % EX SWAB
2.0000 | Freq: Once | CUTANEOUS | Status: AC
  Administered 2023-05-27: 2 via TOPICAL

## 2023-05-27 MED ORDER — HYDROCODONE-ACETAMINOPHEN 5-325 MG PO TABS: 1.0000 | ORAL_TABLET | ORAL | Status: DC | PRN

## 2023-05-27 MED ORDER — CEFAZOLIN SODIUM-DEXTROSE 2-4 GM/100ML-% IV SOLN
2.0000 g | INTRAVENOUS | Status: AC
  Administered 2023-05-27: 2 g via INTRAVENOUS
  Filled 2023-05-27: qty 100

## 2023-05-27 MED ORDER — MIDAZOLAM HCL 2 MG/2ML IJ SOLN
INTRAMUSCULAR | Status: AC
  Filled 2023-05-27: qty 2

## 2023-05-27 MED ORDER — ACETAMINOPHEN 325 MG PO TABS: 325.0000 mg | ORAL_TABLET | Freq: Four times a day (QID) | ORAL | Status: DC | PRN

## 2023-05-27 MED ORDER — ISOSORBIDE MONONITRATE ER 30 MG PO TB24
30.0000 mg | ORAL_TABLET | Freq: Every day | ORAL | Status: DC
  Filled 2023-05-27: qty 1

## 2023-05-27 MED ORDER — SODIUM CHLORIDE 0.9 % IV SOLN: INTRAVENOUS | Status: DC

## 2023-05-27 MED ORDER — INSULIN ASPART 100 UNIT/ML IJ SOLN
0.0000 [IU] | Freq: Three times a day (TID) | INTRAMUSCULAR | Status: DC
  Administered 2023-05-27: 3 [IU] via SUBCUTANEOUS
  Administered 2023-05-28: 2 [IU] via SUBCUTANEOUS

## 2023-05-27 MED ORDER — CEFAZOLIN SODIUM-DEXTROSE 2-4 GM/100ML-% IV SOLN
2.0000 g | Freq: Three times a day (TID) | INTRAVENOUS | Status: AC
  Administered 2023-05-27 – 2023-05-28 (×3): 2 g via INTRAVENOUS
  Filled 2023-05-27 (×3): qty 100

## 2023-05-27 MED ORDER — METHOCARBAMOL 500 MG PO TABS: 500.0000 mg | ORAL_TABLET | Freq: Four times a day (QID) | ORAL | Status: DC | PRN

## 2023-05-27 MED ORDER — FLUTICASONE PROPIONATE 50 MCG/ACT NA SUSP: 1.0000 | Freq: Every day | NASAL | Status: DC | PRN

## 2023-05-27 MED ORDER — LEVOTHYROXINE SODIUM 75 MCG PO TABS
75.0000 ug | ORAL_TABLET | Freq: Every day | ORAL | Status: DC
  Administered 2023-05-28: 75 ug via ORAL
  Filled 2023-05-27: qty 1

## 2023-05-27 MED ORDER — LISINOPRIL 20 MG PO TABS
20.0000 mg | ORAL_TABLET | Freq: Every day | ORAL | Status: DC
  Administered 2023-05-27: 20 mg via ORAL
  Filled 2023-05-27: qty 1

## 2023-05-27 MED ORDER — ORAL CARE MOUTH RINSE: 15.0000 mL | Freq: Once | OROMUCOSAL | Status: AC

## 2023-05-27 MED ORDER — MIDAZOLAM HCL 2 MG/2ML IJ SOLN
INTRAMUSCULAR | Status: DC | PRN
  Administered 2023-05-27: 1 mg via INTRAVENOUS
  Administered 2023-05-27: .5 mg via INTRAVENOUS

## 2023-05-27 MED ORDER — DEXAMETHASONE SODIUM PHOSPHATE 10 MG/ML IJ SOLN
INTRAMUSCULAR | Status: AC
  Filled 2023-05-27: qty 1

## 2023-05-27 MED ORDER — METOCLOPRAMIDE HCL 5 MG/ML IJ SOLN: 5.0000 mg | Freq: Three times a day (TID) | INTRAMUSCULAR | Status: DC | PRN

## 2023-05-27 MED ORDER — VANCOMYCIN HCL 1000 MG IV SOLR
INTRAVENOUS | Status: AC
  Filled 2023-05-27: qty 20

## 2023-05-27 MED ORDER — CHLORHEXIDINE GLUCONATE 0.12 % MT SOLN: 15.0000 mL | Freq: Once | OROMUCOSAL | Status: AC

## 2023-05-27 MED ORDER — FAMOTIDINE 20 MG PO TABS
20.0000 mg | ORAL_TABLET | Freq: Two times a day (BID) | ORAL | Status: DC
  Administered 2023-05-27: 20 mg via ORAL
  Filled 2023-05-27: qty 1

## 2023-05-27 MED ORDER — 0.9 % SODIUM CHLORIDE (POUR BTL) OPTIME
TOPICAL | Status: DC | PRN
  Administered 2023-05-27: 4000 mL
  Administered 2023-05-27: 1000 mL

## 2023-05-27 MED ORDER — EPHEDRINE 5 MG/ML INJ
INTRAVENOUS | Status: AC
  Filled 2023-05-27: qty 5

## 2023-05-27 MED ORDER — LIDOCAINE 2% (20 MG/ML) 5 ML SYRINGE
INTRAMUSCULAR | Status: DC | PRN
  Administered 2023-05-27: 60 mg via INTRAVENOUS
  Administered 2023-05-27: 2 mg via INTRAVENOUS

## 2023-05-27 MED ORDER — INSULIN ASPART 100 UNIT/ML IJ SOLN: 0.0000 [IU] | INTRAMUSCULAR | Status: DC | PRN

## 2023-05-27 MED ORDER — MENTHOL 3 MG MT LOZG: 1.0000 | LOZENGE | OROMUCOSAL | Status: DC | PRN

## 2023-05-27 MED ORDER — MORPHINE SULFATE (PF) 2 MG/ML IV SOLN: 0.5000 mg | INTRAVENOUS | Status: DC | PRN

## 2023-05-27 MED ORDER — PHENYLEPHRINE 80 MCG/ML (10ML) SYRINGE FOR IV PUSH (FOR BLOOD PRESSURE SUPPORT)
PREFILLED_SYRINGE | INTRAVENOUS | Status: AC
  Filled 2023-05-27: qty 10

## 2023-05-27 SURGICAL SUPPLY — 75 items
AID PSTN UNV HD RSTRNT DISP (MISCELLANEOUS) ×1
ALCOHOL 70% 16 OZ (MISCELLANEOUS) ×1 IMPLANT
APL PRP STRL LF DISP 70% ISPRP (MISCELLANEOUS) ×1
BAG COUNTER SPONGE SURGICOUNT (BAG) ×1 IMPLANT
BAG SPNG CNTER NS LX DISP (BAG) ×1
BASEPLATE GLENOSPHERE 25 (Plate) IMPLANT
BIT DRILL 1.5X85 (BIT) IMPLANT
BIT DRILL 2.7 W/STOP DISP (BIT) IMPLANT
BIT DRILL QUICK REL 1/8 2PK SL (BIT) IMPLANT
BIT DRILL TWIST 2.7 (BIT) IMPLANT
BLADE SAW SGTL 13X75X1.27 (BLADE) ×1 IMPLANT
BRNG HUM +3 36 RVRS SHLDR (Shoulder) ×1 IMPLANT
CHLORAPREP W/TINT 26 (MISCELLANEOUS) ×1 IMPLANT
CLSR STERI-STRIP ANTIMIC 1/2X4 (GAUZE/BANDAGES/DRESSINGS) IMPLANT
COOLER ICEMAN CLASSIC (MISCELLANEOUS) ×1 IMPLANT
COVER SURGICAL LIGHT HANDLE (MISCELLANEOUS) ×1 IMPLANT
DRAPE INCISE IOBAN 66X45 STRL (DRAPES) ×1 IMPLANT
DRAPE U-SHAPE 47X51 STRL (DRAPES) ×2 IMPLANT
DRSG AQUACEL AG ADV 3.5X10 (GAUZE/BANDAGES/DRESSINGS) ×1 IMPLANT
ELECT BLADE 4.0 EZ CLEAN MEGAD (MISCELLANEOUS) ×1
ELECT REM PT RETURN 9FT ADLT (ELECTROSURGICAL) ×1
ELECTRODE BLDE 4.0 EZ CLN MEGD (MISCELLANEOUS) ×1 IMPLANT
ELECTRODE REM PT RTRN 9FT ADLT (ELECTROSURGICAL) ×1 IMPLANT
GAUZE SPONGE 4X4 12PLY STRL LF (GAUZE/BANDAGES/DRESSINGS) ×1 IMPLANT
GLENOID SPHERE 36+6 (Joint) IMPLANT
GLOVE BIOGEL PI IND STRL 6.5 (GLOVE) ×1 IMPLANT
GLOVE BIOGEL PI IND STRL 8 (GLOVE) ×1 IMPLANT
GLOVE ECLIPSE 6.5 STRL STRAW (GLOVE) ×1 IMPLANT
GLOVE ECLIPSE 8.0 STRL XLNG CF (GLOVE) ×1 IMPLANT
GOWN STRL REUS W/ TWL LRG LVL3 (GOWN DISPOSABLE) ×1 IMPLANT
GOWN STRL REUS W/ TWL XL LVL3 (GOWN DISPOSABLE) ×1 IMPLANT
GOWN STRL REUS W/TWL LRG LVL3 (GOWN DISPOSABLE) ×1
GOWN STRL REUS W/TWL XL LVL3 (GOWN DISPOSABLE) ×1
GUIDE MODEL REV SHLD LT SZ3 (ORTHOPEDIC DISPOSABLE SUPPLIES) IMPLANT
HYDROGEN PEROXIDE 16OZ (MISCELLANEOUS) ×1 IMPLANT
JET LAVAGE IRRISEPT WOUND (IRRIGATION / IRRIGATOR) ×1
KIT BASIN OR (CUSTOM PROCEDURE TRAY) ×1 IMPLANT
KIT TURNOVER KIT B (KITS) ×1 IMPLANT
LAVAGE JET IRRISEPT WOUND (IRRIGATION / IRRIGATOR) ×1 IMPLANT
MANIFOLD NEPTUNE II (INSTRUMENTS) ×1 IMPLANT
NDL SUT 6 .5 CRC .975X.05 MAYO (NEEDLE) IMPLANT
NEEDLE MAYO TAPER (NEEDLE) ×1
NS IRRIG 1000ML POUR BTL (IV SOLUTION) ×1 IMPLANT
PACK SHOULDER (CUSTOM PROCEDURE TRAY) ×1 IMPLANT
PAD COLD SHLDR WRAP-ON (PAD) ×1 IMPLANT
PIN THREADED REVERSE (PIN) IMPLANT
REAMER GUIDE BUSHING SURG DISP (MISCELLANEOUS) IMPLANT
REAMER GUIDE W/SCREW AUG (MISCELLANEOUS) IMPLANT
RESTRAINT HEAD UNIVERSAL NS (MISCELLANEOUS) ×1 IMPLANT
RETRIEVER SUT HEWSON (MISCELLANEOUS) ×1 IMPLANT
SCREW BONE LOCKING 4.75X30X3.5 (Screw) IMPLANT
SCREW BONE STRL 6.5MMX25MM (Screw) IMPLANT
SCREW CENTRAL 6.5X20MM (Screw) IMPLANT
SCREW LOCKING 4.75MMX15MM (Screw) IMPLANT
SCREW LOCKING STRL 4.75X25X3.5 (Screw) IMPLANT
SLING ARM IMMOBILIZER LRG (SOFTGOODS) ×1 IMPLANT
SOL PREP POV-IOD 4OZ 10% (MISCELLANEOUS) ×1 IMPLANT
SPONGE T-LAP 18X18 ~~LOC~~+RFID (SPONGE) ×1 IMPLANT
STEM SHOULDER (Stem) IMPLANT
STRIP CLOSURE SKIN 1/2X4 (GAUZE/BANDAGES/DRESSINGS) ×1 IMPLANT
SUCTION TUBE FRAZIER 10FR DISP (SUCTIONS) ×1 IMPLANT
SUT BROADBAND TAPE 2PK 1.5 (SUTURE) IMPLANT
SUT MNCRL AB 3-0 PS2 18 (SUTURE) ×1 IMPLANT
SUT SILK 2 0 TIES 10X30 (SUTURE) ×1 IMPLANT
SUT VIC AB 0 CT1 27 (SUTURE) ×4
SUT VIC AB 0 CT1 27XBRD ANBCTR (SUTURE) ×4 IMPLANT
SUT VIC AB 1 CT1 27 (SUTURE) ×2
SUT VIC AB 1 CT1 27XBRD ANBCTR (SUTURE) ×2 IMPLANT
SUT VIC AB 2-0 CT1 27 (SUTURE) ×3
SUT VIC AB 2-0 CT1 TAPERPNT 27 (SUTURE) ×3 IMPLANT
SUT VICRYL 0 UR6 27IN ABS (SUTURE) ×2 IMPLANT
TOWEL GREEN STERILE (TOWEL DISPOSABLE) ×1 IMPLANT
TRAY HUM REV SHOULDER 36 +3 (Shoulder) IMPLANT
TRAY HUM REV SHOULDER STD +6 (Shoulder) IMPLANT
WATER STERILE IRR 1000ML POUR (IV SOLUTION) ×1 IMPLANT

## 2023-05-27 NOTE — Anesthesia Procedure Notes (Signed)
Anesthesia Regional Block: Interscalene brachial plexus block   Pre-Anesthetic Checklist: , timeout performed,  Correct Patient, Correct Site, Correct Laterality,  Correct Procedure, Correct Position, site marked,  Risks and benefits discussed,  Surgical consent,  Pre-op evaluation,  At surgeon's request and post-op pain management  Laterality: Left  Prep: Dura Prep       Needles:  Injection technique: Single-shot  Needle Type: Echogenic Stimulator Needle     Needle Length: 5cm  Needle Gauge: 20     Additional Needles:   Procedures:,,,, ultrasound used (permanent image in chart),,    Narrative:  Start time: 05/27/2023 7:00 AM End time: 05/27/2023 7:10 AM Injection made incrementally with aspirations every 5 mL.  Performed by: Personally  Anesthesiologist: Atilano Median, DO  Additional Notes: Patient identified. Risks/Benefits/Options discussed with patient including but not limited to bleeding, infection, nerve damage, failed block, incomplete pain control. Patient expressed understanding and wished to proceed. All questions were answered. Sterile technique was used throughout the entire procedure. Please see nursing notes for vital signs. Aspirated in 5cc intervals with injection for negative confirmation. Patient was given instructions on fall risk and not to get out of bed. All questions and concerns addressed with instructions to call with any issues or inadequate analgesia.

## 2023-05-27 NOTE — Op Note (Signed)
NAME: Melka, Kayde L. MEDICAL RECORD NO: [id number] ACCOUNT NO: [id number] DATE OF BIRTH: 25-Mar-1946 FACILITY: [facility] LOCATION: [facility] PHYSICIAN: Graylin Shiver. August Saucer, MD  Operative Report   DATE OF PROCEDURE: 05/27/2023  PREOPERATIVE DIAGNOSIS:  Left shoulder rotator cuff arthropathy, arthritis, biceps tendinitis.  POSTOPERATIVE DIAGNOSIS:  Left shoulder rotator cuff arthropathy, arthritis, biceps tendinitis.  PROCEDURES PERFORMED: 1.  Left shoulder reverse shoulder replacement using comprehensive Biomet system, mini baseplate with 36 +6 glenosphere, 10 mm mini humeral stem with 40 mm +6 tapered offset humeral tray with +3 thickness 36 bearing. 2.  Biceps tenodesis.  SURGEON ATTENDING:  Graylin Shiver. August Saucer, MD  ASSISTANT:  Karenann Cai, PA.  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  The patient also has biceps tendinitis symptoms.  DESCRIPTION OF PROCEDURE:  The patient was brought to the operating room where general endotracheal anesthesia was induced.  Preoperative antibiotics administered.  Timeout was called.  The patient was placed in the beach chair position with head in  neutral position.  Left shoulder, arm and hand prescrubbed with hydrogen peroxide followed by alcohol and Betadine, which was allowed to air dry, then prepped with ChloraPrep solution and draped in a sterile manner.  Collier Flowers was used to cover the operative  field.  After calling timeout, deltopectoral approach was made.  IrriSept solution utilized. Cephalic vein mobilized laterally.  Crossing vessels were ligated.  Manual release of the anterior portion of the deltoid was performed.  Axillary nerve  palpated and protected at all times during the case.  Subdeltoid and subacromial adhesions were released.  Significant rotator cuff tear was present but the subscapularis was intact.  At  this time, the upper 1.5 cm of the pec was released.  Biceps tendon  then tenodesed to that remnant pec tendon stump using five 0 Vicryl sutures.  Remaining biceps tendon was then elevated superiorly to open up the rotator interval as well as release the transverse humeral ligament.  This biceps stump was tagged.   Circumflex vessels were ligated.  Subscap detached from the lesser tuberosity using a 15 blade and that dissection was carried around to the 7 o'clock position including blunt dissection using a Cobb elevator of 2 cm of the capsule off the inferior  humeral neck.  Next, the head was dislocated.  Reaming was performed up to size 10.  The broaching was then performed in 30 degrees of retroversion comparable to the patient's native version.  Head was cut first prior to broaching.  Good head cut was  obtained.  Next, after broaching, a 38 mm cap was placed and the posterior retractor and anterior retractors were placed.  At this time, the labrum was circumferentially excised.  Bankart lesion created from the 12 o'clock to 6 o'clock position on the  anterior portion of the humerus.  Then, using the guide, reaming was performed for the baseplate.  There was a slight tilt placed in accordance with preoperative templating.  The baseplate was then tapped into position.  Trial reduction was then  performed with multiple combinations and the most stable combination was the 36 +6 glenosphere with the +6 offset humeral tray with a +3 liner.  Trial components were removed.  Thorough irrigation was performed. Suture tapes x 6 were placed into the  lesser tuberosity for subscap repair.  Next, the glenosphere was placed into the [facility] taper, which was dried.  At this  time, we placed the stem into the canal, which had been irrigated with IrriSept solution, which was sucked out and then vancomycin  powder was placed.  Very good fit of the stem was obtained in the canal. Re-reduction with the standard and +3 liner  was performed and +3 liner gave optimal stability with extension as well as adduction and forward force along with internal and external  rotation, 90 degrees of abduction. True components placed at that time.  Thorough irrigation was performed x 3 liters of irrigating solution.  Next, the subscap was then repaired with the arm in about 40 degrees of external rotation.  Good release of the  subscap had been performed in order to allow for mobilization.  Next, the IrriSept solution followed by vancomycin powder was placed on the prosthesis.  The axillary nerve again palpated and found to be intact.  Deltopectoral approach was then closed  using a combination of #1 Vicryl suture followed by interrupted inverted 0 Vicryl suture, 2-0 Vicryl suture, and 3-0 Monocryl with Steri-Strips and Aquacel dressing applied along with a shoulder immobilizer.  Luke's assistance was required at all times  for retraction, opening, closing, mobilization of tissue.  His assistance was a medical necessity.   MUK D: 05/27/2023 11:06:17 am T: 05/27/2023 11:16:00 am  JOB: [id number]/ [id number]

## 2023-05-27 NOTE — Anesthesia Procedure Notes (Signed)
Procedure Name: Intubation Date/Time: 05/27/2023 7:35 AM  Performed by: Kayleen Memos, CRNAPre-anesthesia Checklist: Patient identified, Emergency Drugs available, Suction available, Patient being monitored and Timeout performed Patient Re-evaluated:Patient Re-evaluated prior to induction Oxygen Delivery Method: Circle system utilized Preoxygenation: Pre-oxygenation with 100% oxygen Induction Type: IV induction Ventilation: Mask ventilation without difficulty Laryngoscope Size: Mac and 4 Grade View: Grade IV Tube type: Oral Tube size: 7.0 mm Number of attempts: 1 Airway Equipment and Method: Bougie stylet Placement Confirmation: positive ETCO2, breath sounds checked- equal and bilateral and CO2 detector Secured at: 21 cm Tube secured with: Tape Dental Injury: Teeth and Oropharynx as per pre-operative assessment

## 2023-05-27 NOTE — Anesthesia Postprocedure Evaluation (Signed)
Anesthesia Post Note  Patient: Karl Pock  Procedure(s) Performed: LEFT REVERSE SHOULDER ARTHROPLASTY (Left: Shoulder) BICEPS TENODESIS (Left: Shoulder)     Patient location during evaluation: PACU Anesthesia Type: Regional and General Level of consciousness: awake and alert Pain management: pain level controlled Vital Signs Assessment: post-procedure vital signs reviewed and stable Respiratory status: spontaneous breathing, nonlabored ventilation, respiratory function stable and patient connected to nasal cannula oxygen Cardiovascular status: blood pressure returned to baseline and stable Postop Assessment: no apparent nausea or vomiting Anesthetic complications: no   No notable events documented.  Last Vitals:  Vitals:   05/27/23 1145 05/27/23 1218  BP: (!) 109/58 111/68  Pulse: (!) 58 66  Resp: 11 18  Temp:  36.4 C  SpO2: 95% 96%    Last Pain:  Vitals:   05/27/23 1230  TempSrc:   PainSc: 0-No pain                 Earl Lites P Jovannie Ulibarri

## 2023-05-27 NOTE — Brief Op Note (Signed)
   05/27/2023  10:59 AM  PATIENT:  Kristin [name]  77 y.o. female  PRE-OPERATIVE DIAGNOSIS:  left shoulder rotator cuff arthropathy, biceps tendinitis  POST-OPERATIVE DIAGNOSIS:  left shoulder rotator cuff arthropathy, biceps tendinitis  PROCEDURE:  Procedure(s): LEFT REVERSE SHOULDER ARTHROPLASTY BICEPS TENODESIS  SURGEON:  Surgeon(s): August Saucer, Corrie Mckusick, MD  ASSISTANT: Karenann Cai, PA  ANESTHESIA:   General  EBL: 100 ml    Total I/O In: 900 [I.V.:700; IV Piggyback:200] Out: 100 [Blood:100]  BLOOD ADMINISTERED: none  DRAINS: None  LOCAL MEDICATIONS USED:  none  SPECIMEN:  No Specimen  COUNTS:  YES  TOURNIQUET:  * No tourniquets in log *  DICTATION: .Other Dictation: Dictation Number [id number]  PLAN OF CARE: Admit for overnight observation  PATIENT DISPOSITION:  PACU - hemodynamically stable

## 2023-05-27 NOTE — H&P (Signed)
Kristin [name] is an 77 y.o. female.   Chief Complaint: Left shoulder pain HPI: Kristin [name] is a 77 y.o. female who presents reporting left shoulder pain.  She has had pain for over a year.  Last year had some relief with subacromial injection and physical therapy.  In October the pain became severe.  Pain wakes her from sleep at night.  She is left-hand dominant.  Reports decreased range of motion.  Complains of constant pain including scapula and a little bit of neck pain.  Denies any numbness and tingling in her arm and hand.  Right shoulder is okay.  She cannot do any lifting with the left.  She does report some limited range of motion.  Pain does wake her some nights.  She uses tramadol.  Hard for her to lift overhead.  Last hemoglobin A1c 5.9.  She did do well with left total knee replacement.  That in terms of recovery as well as pain management.  She cannot take the oxycodone but she can take Norco.  She cannot take anti-inflammatories due to kidney damage from chemo.  She did have lymph node excision in 1992 for breast cancer.  She anticipates doing physical therapy at [facility] where she lives with her husband..    Past Medical History:  Diagnosis Date   Anemia 1992   Arthritis    oa   Breast CA (HCC) 1992   lumpectomy with chemo and radiation left breast   Chronic kidney disease stage 3    watching creatining levels sees dr nwbu   Diabetes mellitus without complication (HCC)    type 2    GERD (gastroesophageal reflux disease)    Hiatal hernia    High cholesterol    Hypertension    Hypothyroidism    Personal history of radiation therapy    Sleep apnea     Past Surgical History:  Procedure Laterality Date   BACK SURGERY     upper and lower upper back has clamp   BREAST LUMPECTOMY Left    and lymph nodes removed (16-18)   CARDIAC CATHETERIZATION  01/29/2022   CATARACT EXTRACTION Bilateral    CHOLECYSTECTOMY     COLONOSCOPY  10/2022   TONSILLECTOMY     TOTAL  KNEE ARTHROPLASTY Left 09/12/2017   Procedure: LEFT TOTAL KNEE ARTHROPLASTY;  Surgeon: Kathryne Hitch, MD;  Location: [facility];  Service: Orthopedics;  Laterality: Left;   TUBAL LIGATION      Family History  Problem Relation Age of Onset   Breast cancer Mother 45   Social History:  reports that she has never smoked. She has never used smokeless tobacco. She reports that she does not currently use alcohol. She reports that she does not use drugs.  Allergies:  Allergies  Allergen Reactions   Ibuprofen Other (See Comments)    Does not take due to kidney function    Levodopa Other (See Comments)    Increase of dopamine levels   Oxycodone Other (See Comments)    Pt cannot tolerate oxycodone- makes her "loopy"/ AMS    Medications Prior to Admission  Medication Sig Dispense Refill   aspirin EC 81 MG tablet Take 81 mg by mouth every other day. Swallow whole.     atorvastatin (LIPITOR) 40 MG tablet Take 40 mg by mouth at bedtime.     calcium carbonate (CALCIUM 600) 600 MG TABS tablet Take 600 mg by mouth 2 (two) times daily with a meal.  Cholecalciferol (VITAMIN D) 125 MCG (5000 UT) CAPS Take 5,000 Units by mouth daily.     colestipol (COLESTID) 1 g tablet Take 1 g by mouth daily as needed (diarrhea).     cyanocobalamin (VITAMIN B12) 1000 MCG tablet Take 1,000 mcg by mouth daily.     famotidine (PEPCID) 20 MG tablet Take 20 mg by mouth 2 (two) times daily.     ferrous sulfate 325 (65 FE) MG tablet Take 325 mg by mouth at bedtime.     fluticasone (FLONASE) 50 MCG/ACT nasal spray Place 1 spray into both nostrils daily as needed for allergies or rhinitis.     furosemide (LASIX) 20 MG tablet Take 10 mg by mouth daily as needed for edema.     gabapentin (NEURONTIN) 300 MG capsule Take 600 mg by mouth at bedtime.     hydrALAZINE (APRESOLINE) 25 MG tablet Take 75 mg by mouth in the morning and at bedtime.     isosorbide mononitrate (IMDUR) 30 MG 24 hr tablet Take 30 mg by mouth daily.      JARDIANCE 10 MG TABS tablet Take 10 mg by mouth daily.     lisinopril (ZESTRIL) 20 MG tablet Take 20 mg by mouth at bedtime.     MAGNESIUM GLYCINATE PO Take 200 mg by mouth See admin instructions. Take 600 mg in the morning and 400 mg at bedtime     Melatonin 10 MG TABS Take 10 mg by mouth at bedtime as needed (sleep).     Menaquinone-7 (VITAMIN K2 PO) Take 45 mg by mouth daily.     metoprolol succinate (TOPROL-XL) 50 MG 24 hr tablet Take 50 mg by mouth daily.     Multiple Vitamins-Minerals (MULTIVITAMIN ADULT PO) Take 1 tablet by mouth daily.      niacin (SLO-NIACIN) 500 MG tablet Take 500 mg by mouth daily.     Omega-3 Fatty Acids (FISH OIL) 1200 MG CAPS Take 1,200 mg by mouth at bedtime.      OZEMPIC, 0.25 OR 0.5 MG/DOSE, 2 MG/3ML SOPN Inject 0.5 mg into the skin once a week. In the morning     Polyethyl Glycol-Propyl Glycol (SYSTANE) 0.4-0.3 % SOLN Take 1 drop by mouth daily as needed (Dry eyes).     SYNTHROID 75 MCG tablet Take 75 mcg by mouth daily before breakfast.     traMADol (ULTRAM) 50 MG tablet Take 50 mg by mouth as needed for severe pain or moderate pain.     zolpidem (AMBIEN CR) 12.5 MG CR tablet Take 12.5 mg by mouth at bedtime.       Results for orders placed or performed during the hospital encounter of 05/27/23 (from the past 48 hour(s))  Glucose, capillary     Status: Abnormal   Collection Time: 05/27/23  5:50 AM  Result Value Ref Range   Glucose-Capillary 134 (H) 70 - 99 mg/dL    Comment: Glucose reference range applies only to samples taken after fasting for at least 8 hours.   No results found.  Review of Systems  Musculoskeletal:  Positive for arthralgias.  All other systems reviewed and are negative.   Blood pressure (!) 153/70, pulse 72, temperature 98.5 F (36.9 C), temperature source Oral, resp. rate 20, height 5\' 4"  (1.626 m), weight 108.3 kg, SpO2 96 %. Physical Exam Vitals reviewed.  HENT:     Head: Normocephalic.     Nose: Nose normal.      Mouth/Throat:     Mouth: Mucous membranes are moist.  Eyes:     Pupils: Pupils are equal, round, and reactive to light.  Cardiovascular:     Rate and Rhythm: Normal rate.     Pulses: Normal pulses.  Pulmonary:     Effort: Pulmonary effort is normal.  Abdominal:     General: Abdomen is flat.  Musculoskeletal:     Cervical back: Normal range of motion.  Skin:    General: Skin is warm.     Capillary Refill: Capillary refill takes less than 2 seconds.  Neurological:     General: No focal deficit present.     Mental Status: She is alert.  Psychiatric:        Mood and Affect: Mood normal.     Ortho exam demonstrates passive range of motion on the left of 70/90/150. She does have weakness to infraspinatus and supraspinatus testing. Subscap strength is intact. No Popeye deformity is present but she does have tenderness in the bicipital groove and tenderness with resisted supination on the left. Deltoid is functional. She does have some grinding in that left shoulder with passive range of motion. Neck range of motion is full. She has 5 out of 5 grip EPL FPL interosseous resection extension bicep triceps and deltoid strength.  Skin intact in that left shoulder region with no long-term obvious detrimental effects from remote radiation treatment Assessment/Plan  Impression is left shoulder rotator cuff arthropathy with biceps tendinitis.  She is having weakness consistent with rotator cuff arthropathy.  I had a long discussion today about operative and nonoperative treatment options.  She really has had an excellent course of nonoperative treatment including physical therapy and injections.  This gave her temporary relief but her pain is recurring and is severe.  Interferes with her activities of daily living enough in her dominant arm that she would like to proceed with reverse shoulder replacement.  With the use of models and an explanation of patient specific instrumentation the rationale of reverse  shoulder replacement is discussed with the patient.  The risk and benefits include not limited to infection nerve and vessel damage instability as well as incomplete functional restoration and incomplete pain relief are all discussed.  I do think it should be a slightly easier recovery than her knee replacement.  The fact that she did well with the knee replacement is a good prognostic factor for her.  All questions answered.    Burnard Bunting, MD 05/27/2023, 6:21 AM

## 2023-05-27 NOTE — Transfer of Care (Signed)
Immediate Anesthesia Transfer of Care Note  Patient: Kristin [name]  Procedure(s) Performed: LEFT REVERSE SHOULDER ARTHROPLASTY (Left: Shoulder) BICEPS TENODESIS (Left: Shoulder)  Patient Location: PACU  Anesthesia Type:General  Level of Consciousness: awake, alert , and oriented  Airway & Oxygen Therapy: Patient Spontanous Breathing and Patient connected to nasal cannula oxygen  Post-op Assessment: Report given to RN, Post -op Vital signs reviewed and stable, and Patient moving all extremities  Post vital signs: stable  Last Vitals:  Vitals Value Taken Time  BP    Temp    Pulse 71 05/27/23 1049  Resp 0 05/27/23 1049  SpO2 93 % 05/27/23 1049  Vitals shown include unvalidated device data.  Last Pain:  Vitals:   05/27/23 0635  TempSrc:   PainSc: 0-No pain         Complications: No notable events documented.

## 2023-05-28 ENCOUNTER — Encounter (HOSPITAL_COMMUNITY): Payer: Self-pay | Admitting: Orthopedic Surgery

## 2023-05-28 DIAGNOSIS — M19012 Primary osteoarthritis, left shoulder: Secondary | ICD-10-CM | POA: Diagnosis not present

## 2023-05-28 LAB — GLUCOSE, CAPILLARY: Glucose-Capillary: 140 mg/dL — ABNORMAL HIGH (ref 70–99)

## 2023-05-28 MED ORDER — METHOCARBAMOL 500 MG PO TABS: 500.0000 mg | ORAL_TABLET | Freq: Three times a day (TID) | ORAL | 0 refills | Status: DC | PRN

## 2023-05-28 MED ORDER — HYDROCODONE-ACETAMINOPHEN 5-325 MG PO TABS: 1.0000 | ORAL_TABLET | ORAL | 0 refills | Status: DC | PRN

## 2023-05-28 MED ORDER — DOCUSATE SODIUM 100 MG PO CAPS: 100.0000 mg | ORAL_CAPSULE | Freq: Two times a day (BID) | ORAL | 0 refills | Status: DC

## 2023-05-28 NOTE — Progress Notes (Signed)
  Subjective: Kristin [name] is a 77 y.o. female s/p left RSA.  They are POD1.  Pt's pain is controlled.  Patient denies any complaints of chest pain, shortness of breath, abdominal pain. Block still in effect. Has been ambulatory around the unit  Objective: Vital signs in last 24 hours: Temp:  [97.6 F (36.4 C)-98.3 F (36.8 C)] 98.3 F (36.8 C) (06/19 0821) Pulse Rate:  [58-72] 72 (06/19 0821) Resp:  [10-20] 18 (06/19 0821) BP: (101-145)/(48-78) 116/70 (06/19 0821) SpO2:  [92 %-99 %] 99 % (06/19 0821)  Intake/Output from previous day: 06/18 0701 - 06/19 0700 In: 1592.9 [P.O.:480; I.V.:700; IV Piggyback:412.9] Out: 100 [Blood:100] Intake/Output this shift: No intake/output data recorded.  Exam:  No gross blood or drainage overlying the dressing 2+ radial pulse of the operative extremity Muscular exam limited by interscalene block   Labs: No results for input(s): "HGB" in the last 72 hours. No results for input(s): "WBC", "RBC", "HCT", "PLT" in the last 72 hours. No results for input(s): "NA", "K", "CL", "CO2", "BUN", "CREATININE", "GLUCOSE", "CALCIUM" in the last 72 hours. No results for input(s): "LABPT", "INR" in the last 72 hours.  Assessment/Plan: Pt is POD1 s/p left RSA    -Plan to discharge to home today  -No lifting with the operative arm  -Stay in sling except for showering/sleeping and using CPM machine at home.  No lifting with the operative arm more than 1 to 2 pounds  -Follow-up with Dr. August Saucer in clinic 2 weeks postoperatively     [facility] 05/28/2023, 8:30 AM

## 2023-05-28 NOTE — Plan of Care (Signed)
Pt doing well. Pt and husband given D/C instructions with verbal understanding. Rx's were sent to the pharmacy by MD. Pt's incision is clean and dry with no sign of infection. Pt's IV was removed prior to D/C. Pt D/C'd home via wheelchair per MD order. Pt is stable @ D/C and has no other needs at this time. Stacie Knutzen, RN  

## 2023-05-28 NOTE — TOC Transition Note (Signed)
Transition of Care [facility]) - CM/SW Discharge Note   Patient Details  Name: Kristin [name] MRN: [id number] Date of Birth: Jun 25, 1946  Transition of Care [facility]) CM/SW Contact:  Lawerance Sabal, RN Phone Number: 05/28/2023, 9:57 AM   Clinical Narrative:      Patient with order to DC to home today. Unit staff to provide DME needed for home.   No HH needs identified  Patient will have family/ friends provide transportation home. No other TOC needs identified for DC       Patient Goals and CMS Choice      Discharge Placement                         Discharge Plan and Services Additional resources added to the After Visit Summary for                                       Social Determinants of Health (SDOH) Interventions SDOH Screenings   Food Insecurity: No Food Insecurity (08/25/2022)  Housing: Low Risk  (08/25/2022)  Transportation Needs: No Transportation Needs (08/25/2022)  Utilities: Not At Risk (08/25/2022)  Tobacco Use: Low Risk  (05/19/2023)     Readmission Risk Interventions     No data to display

## 2023-05-28 NOTE — Progress Notes (Signed)
Physical Therapy Note  Spoke with occupational therapy after their initial evaluation. OT reports patient is functioning at a high level of independence and no physical therapy is indicated at this time. He recommended use of a SPC. PT is signing-off. Please re-order if there is any significant change in status. Thank you for this referral.  Kathlyn Sacramento, PT, DPT [facility]  Rehabilitation Services Physical Therapist Office: [phone] Website: [email or web address]

## 2023-05-28 NOTE — Evaluation (Signed)
Occupational Therapy Evaluation Patient Details Name: Kristin [name] MRN: [id number] DOB: 11/16/1946 Today's Date: 05/28/2023   History of Present Illness Pt is a 77 y/o female who presents reporting left shoulder pain.  She has had pain for over a year and decreased ROM. She is s/p L Reverse TSR 05/27/23. PMH of breast cancer status post lumpectomy/chemo/radiation, CKD stage III-IV, non-insulin-dependent type 2 diabetes, hypertension, hyperlipidemia, hypothyroidism, GERD, sleep apnea   Clinical Impression   Pt admitted for above dx, PTA patient lived with spouse and ambulation no AD, Mod I for ADLs. Pt currently presenting with impaired dominant LUE functional use. Pt needing more assistance with bADLs due to impaired functional use, educated pt on hemi dressing techniques using RUE also educated pt on the proper positioning of LUE and sling use. Educated pt on post op shoulder exercises and AROM of uninvolved joints. Pt needing Min to Mod A to complete ADLs. Pt would benefit from acute skilled OT services to address above deficits and help transition to next level of care. Recommend pt follow-up with doctors recommendations for now and follow up with post acute OT when able.      Recommendations for follow up therapy are one component of a multi-disciplinary discharge planning process, led by the attending physician.  Recommendations may be updated based on patient status, additional functional criteria and insurance authorization.   Assistance Recommended at Discharge Set up Supervision/Assistance  Patient can return home with the following A lot of help with bathing/dressing/bathroom;Assistance with cooking/housework;Assist for transportation    Functional Status Assessment  Patient has had a recent decline in their functional status and demonstrates the ability to make significant improvements in function in a reasonable and predictable amount of time.  Equipment Recommendations  None  recommended by OT    Recommendations for Other Services       Precautions / Restrictions Precautions Precautions: Other (comment) Precaution Comments: L total shoulder Required Braces or Orthoses: Sling Restrictions Weight Bearing Restrictions: Yes LUE Weight Bearing: Non weight bearing      Mobility Bed Mobility               General bed mobility comments: pt rec'd and left sitting in recliner. Disucussed using bed rail at home to aid in bed mobility and techniques for husband to provide physcial assist    Transfers Overall transfer level: Needs assistance Equipment used: None Transfers: Sit to/from Stand Sit to Stand: Min guard                  Balance Overall balance assessment: Needs assistance Sitting-balance support: Feet supported, Bilateral upper extremity supported Sitting balance-Leahy Scale: Good Sitting balance - Comments: in recliner   Standing balance support: During functional activity, No upper extremity supported Standing balance-Leahy Scale: Fair Standing balance comment: LUE in sling                           ADL either performed or assessed with clinical judgement   ADL Overall ADL's : Needs assistance/impaired Eating/Feeding: Set up;Minimal assistance   Grooming: Standing;Set up;Supervision/safety   Upper Body Bathing: Sitting;Moderate assistance   Lower Body Bathing: Sitting/lateral leans;Minimal assistance   Upper Body Dressing : Sitting;Min guard Upper Body Dressing Details (indicate cue type and reason): Educated pt on hemi dressing techniques for post op LUE Lower Body Dressing: Sitting/lateral leans;Minimal assistance Lower Body Dressing Details (indicate cue type and reason): Educated pt on compensatory strategies for one handed dressing for  socks Toilet Transfer: Ambulation;Min guard   Toileting- Clothing Manipulation and Hygiene: Sit to/from stand;Min guard       Functional mobility during ADLs: Min  guard General ADL Comments: LUE in sling during OOB mobility     Vision         Perception     Praxis      Pertinent Vitals/Pain Pain Assessment Pain Assessment: No/denies pain Pain Location: LUE still numb post op Pain Intervention(s): Repositioned     Hand Dominance Left   Extremity/Trunk Assessment Upper Extremity Assessment RUE Deficits / Details: WFL LUE Deficits / Details: S/p RTSR, shoulder immobilized in sling.   Lower Extremity Assessment Lower Extremity Assessment: Defer to PT evaluation       Communication Communication Communication: No difficulties   Cognition Arousal/Alertness: Awake/alert Behavior During Therapy: WFL for tasks assessed/performed Overall Cognitive Status: Within Functional Limits for tasks assessed                                       General Comments  VSS on RA, SOB noted following hall ambulation    Exercises Shoulder Exercises Pendulum Exercise: AAROM, 5 reps, Standing, Left (each direction) Shoulder Extension: AAROM, Left, 5 reps, Seated Shoulder ABduction: AAROM, 5 reps, Left, Seated Shoulder External Rotation: AAROM, Left, 5 reps, Seated Elbow Flexion: Limitations Elbow Flexion Limitations: LUE still numb Elbow Extension: Limitations Elbow Extension Limitations: LUE numbness Wrist Flexion: Limitations Wrist Flexion Limitations: L numbness Wrist Extension: Limitations Wrist Extension Limitations: L numbness   Shoulder Instructions Shoulder Instructions Donning/doffing shirt without moving shoulder: Min-guard Donning/doffing sling/immobilizer: Moderate assistance Pendulum exercises (written home exercise program): Min-guard ROM for elbow, wrist and digits of operated UE: Modified independent Sling wearing schedule (on at all times/off for ADL's): Modified independent    Home Living Family/patient expects to be discharged to:: Private residence Living Arrangements: Spouse/significant other Available  Help at Discharge: Family;Available 24 hours/day Type of Home: House Home Access: Level entry     Home Layout: One level     Bathroom Shower/Tub: Producer, television/film/video: Handicapped height (counter on L hand side) Bathroom Accessibility: Yes   Home Equipment: Cane - single point;Rolling Walker (2 wheels);Shower seat - built in;Grab bars - tub/shower;Adaptive equipment Adaptive Equipment: Reacher        Prior Functioning/Environment Prior Level of Function : Independent/Modified Independent             Mobility Comments: Pt reports ambulating no AD ADLs Comments: Mod I        OT Problem List: Impaired UE functional use      OT Treatment/Interventions: Self-care/ADL training;Balance training;DME and/or AE instruction;Therapeutic exercise;Patient/family education;Therapeutic activities    OT Goals(Current goals can be found in the care plan section) Acute Rehab OT Goals Patient Stated Goal: to go home OT Goal Formulation: With patient Time For Goal Achievement: 06/11/23 Potential to Achieve Goals: Good ADL Goals Pt Will Perform Upper Body Dressing: with modified independence;sitting Additional ADL Goal #1: Pt will demonstrate independent compliance with PROM/AAROM HEP LUE/  OT Frequency: Min 4X/week    Co-evaluation              AM-PAC OT "6 Clicks" Daily Activity     Outcome Measure Help from another person eating meals?: A Little Help from another person taking care of personal grooming?: A Little Help from another person toileting, which includes using toliet, bedpan, or urinal?: A  Little Help from another person bathing (including washing, rinsing, drying)?: A Lot Help from another person to put on and taking off regular upper body clothing?: A Little Help from another person to put on and taking off regular lower body clothing?: A Lot 6 Click Score: 16   End of Session Equipment Utilized During Treatment: Gait belt Nurse Communication:  Mobility status  Activity Tolerance: Patient tolerated treatment well Patient left: in chair;with call bell/phone within reach;with family/visitor present  OT Visit Diagnosis: Unsteadiness on feet (R26.81)                Time: 2956-2130 OT Time Calculation (min): 35 min Charges:  OT General Charges $OT Visit: 1 Visit OT Evaluation $OT Eval Moderate Complexity: 1 Mod OT Treatments $Self Care/Home Management : 8-22 mins  05/28/2023  AB, OTR/L  Acute Rehabilitation Services  Office: [phone]   Tristan Schroeder 05/28/2023, 10:16 AM

## 2023-06-04 ENCOUNTER — Ambulatory Visit (INDEPENDENT_AMBULATORY_CARE_PROVIDER_SITE_OTHER): Payer: Medicare Other | Admitting: Orthopedic Surgery

## 2023-06-04 ENCOUNTER — Other Ambulatory Visit (INDEPENDENT_AMBULATORY_CARE_PROVIDER_SITE_OTHER): Payer: Medicare Other

## 2023-06-04 ENCOUNTER — Encounter: Payer: Self-pay | Admitting: Orthopedic Surgery

## 2023-06-04 DIAGNOSIS — Z96612 Presence of left artificial shoulder joint: Secondary | ICD-10-CM

## 2023-06-04 NOTE — Progress Notes (Unsigned)
Post-Op Visit Note   Patient: Kristin [name]           Date of Birth: [birth year]           MRN: [id number] Visit Date: 06/04/2023 PCP: System, Provider Not In   Assessment & Plan:  Chief Complaint:  Chief Complaint  Patient presents with   Left Shoulder - Routine Post Op    05/27/23 left RSA with BT   Visit Diagnoses:  1. History of arthroplasty of left shoulder     Plan: Kristin [name] is a 77 year old patient who is now 2 weeks out left reverse shoulder replacement with biceps tenodesis.  She is in a sling.  On 67 degrees on shoulder CPM.  Doing this 3 times a day for 1 hour each session.  Taking Norco but sparingly.  She notices that it is hard for her to do forward flexion.  On examination her deltoid fires.  Passive range of motion is almost to 90 degrees of forward flexion and abduction.  Motor or sensory function of the hand is intact.  Incision intact.  Plan at this time is prescription for physical therapy given to be done at [facility]  4-week return for clinical recheck.  Continue with home CPM machine with no restriction on passive range of motion.  She has had for about 3 more weeks.  Follow-Up Instructions: No follow-ups on file.   Orders:  Orders Placed This Encounter  Procedures   XR Shoulder Left   No orders of the defined types were placed in this encounter.   Imaging: No results found.  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   AKI (acute kidney injury) (HCC) 08/25/2022   Diarrhea 08/25/2022   Choledocholithiasis 08/25/2022   Nausea and vomiting 08/25/2022   HTN (hypertension) 08/25/2022   HLD (hyperlipidemia) 08/25/2022   Hypothyroidism 08/25/2022   GERD (gastroesophageal reflux disease) 08/25/2022   OSA (obstructive sleep apnea) 08/25/2022   Hemorrhoid 08/25/2022   Unilateral primary osteoarthritis, right knee 01/14/2022   Anemia 04/26/2020   RBBB 04/26/2020    Diabetes (HCC) 04/26/2020   QT prolongation 04/26/2020   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/2017   Closed nondisplaced fracture of head of right radius with routine healing 07/10/2017   Chronic pain of left knee 07/10/2017   Acute pain of left knee 07/10/2017   Unilateral primary osteoarthritis, left knee 07/10/2017   Closed nondisplaced fracture of head of right radius 06/09/2017   Past Medical History:  Diagnosis Date   Anemia 1992   Arthritis    oa   Breast CA (HCC) 1992   lumpectomy with chemo and radiation left breast   Chronic kidney disease stage 3    watching creatining levels sees dr nwbu   Diabetes mellitus without complication (HCC)    type 2    GERD (gastroesophageal reflux disease)    Hiatal hernia    High cholesterol    Hypertension    Hypothyroidism    Personal history of radiation therapy    Sleep apnea     Family History  Problem Relation Age of Onset   Breast cancer Mother 67    Past Surgical History:  Procedure Laterality Date   BACK SURGERY     upper and lower upper back has clamp   BICEPT TENODESIS Left 05/27/2023   Procedure: BICEPS TENODESIS;  Surgeon: Cammy Copa, MD;  Location: [facility];  Service:  Orthopedics;  Laterality: Left;   BREAST LUMPECTOMY Left    and lymph nodes removed (16-18)   CARDIAC CATHETERIZATION  01/29/2022   CATARACT EXTRACTION Bilateral    CHOLECYSTECTOMY     COLONOSCOPY  10/2022   REVERSE SHOULDER ARTHROPLASTY Left 05/27/2023   Procedure: LEFT REVERSE SHOULDER ARTHROPLASTY;  Surgeon: Cammy Copa, MD;  Location[facility] OR;  Service: Orthopedics;  Laterality: Left;   TONSILLECTOMY     TOTAL KNEE ARTHROPLASTY Left 09/12/2017   Procedure: LEFT TOTAL KNEE ARTHROPLASTY;  Surgeon: Kathryne Hitch, MD;  Location: [facility] ORS;  Service: Orthopedics;  Laterality: Left;   TUBAL LIGATION     Social History   Occupational History   Not on file  Tobacco Use   Smoking status: Never   Smokeless tobacco:  Never  Vaping Use   Vaping Use: Never used  Substance and Sexual Activity   Alcohol use: Not Currently    Comment: rare   Drug use: No   Sexual activity: Yes    Birth control/protection: Post-menopausal

## 2023-06-08 DIAGNOSIS — M19012 Primary osteoarthritis, left shoulder: Secondary | ICD-10-CM

## 2023-06-08 DIAGNOSIS — M7522 Bicipital tendinitis, left shoulder: Secondary | ICD-10-CM

## 2023-06-20 NOTE — Discharge Summary (Signed)
Physician Discharge Summary      Patient ID: Kristin [name] MRN: [id number] DOB/AGE: [birth year] 77 y.o.  Admit date: 05/27/2023 Discharge date: 05/28/2023  Admission Diagnoses:  Principal Problem:   S/P reverse total shoulder arthroplasty, left Active Problems:   Arthritis of left shoulder region   Biceps tendonitis on left   Discharge Diagnoses:  Same  Surgeries: Procedure(s): LEFT REVERSE SHOULDER ARTHROPLASTY BICEPS TENODESIS on 05/27/2023   Consultants:   Discharged Condition: Stable  Hospital Course: Bradlie Magruder is an 77 y.o. 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  They were brought to the operating room on 05/27/2023 and underwent the above named procedures.  Pt awoke from anesthesia without complication and was transferred to the floor. On POD1, patient's pain was well-controlled.  No red flag symptoms.  She was ambulatory without any issue or deoxygenation.  Discharged home on POD 1..  Pt will f/u with Dr. August Saucer in clinic in ~2 weeks.   Antibiotics given:  Anti-infectives (From admission, onward)    Start     Dose/Rate Route Frequency Ordered Stop   05/27/23 1400  ceFAZolin (ANCEF) IVPB 2g/100 mL premix        2 g 200 mL/hr over 30 Minutes Intravenous Every 8 hours 05/27/23 1213 05/28/23 0608   05/27/23 0818  vancomycin (VANCOCIN) powder  Status:  Discontinued          As needed 05/27/23 0819 05/27/23 1044   05/27/23 0600  ceFAZolin (ANCEF) IVPB 2g/100 mL premix        2 g 200 mL/hr over 30 Minutes Intravenous On call to O.R. 05/27/23 4098 05/27/23 0803     .  Recent vital signs:  Vitals:   05/28/23 0517 05/28/23 0821  BP: (!) 145/65 116/70  Pulse: 71 72  Resp: 16 18  Temp: 98.3 F (36.8 C) 98.3 F (36.8 C)  SpO2: 97% 99%    Recent laboratory studies:  Results for orders placed or performed during the hospital encounter of 05/27/23   Glucose, capillary  Result Value Ref Range   Glucose-Capillary 134 (H) 70 - 99 mg/dL  Glucose, capillary  Result Value Ref Range   Glucose-Capillary 139 (H) 70 - 99 mg/dL  Glucose, capillary  Result Value Ref Range   Glucose-Capillary 176 (H) 70 - 99 mg/dL  Glucose, capillary  Result Value Ref Range   Glucose-Capillary 154 (H) 70 - 99 mg/dL   Comment 1 Notify RN    Comment 2 Document in Chart   Glucose, capillary  Result Value Ref Range   Glucose-Capillary 140 (H) 70 - 99 mg/dL   Comment 1 Notify RN    Comment 2 Document in Chart     Discharge Medications:   Allergies as of 05/28/2023       Reactions   Ibuprofen Other (See Comments)   Does not take due to kidney function    Levodopa Other (See Comments)   Increase of dopamine levels   Oxycodone Other (See Comments)   Pt cannot tolerate oxycodone- makes her "loopy"/ AMS        Medication List     STOP taking these medications    traMADol 50 MG tablet Commonly known as: ULTRAM       TAKE these medications    aspirin EC 81 MG tablet Take 81 mg by mouth every other day. Swallow whole.   atorvastatin 40 MG tablet Commonly known  as: LIPITOR Take 40 mg by mouth at bedtime.   Calcium 600 600 MG Tabs tablet Generic drug: calcium carbonate Take 600 mg by mouth 2 (two) times daily with a meal.   colestipol 1 g tablet Commonly known as: COLESTID Take 1 g by mouth daily as needed (diarrhea).   cyanocobalamin 1000 MCG tablet Commonly known as: VITAMIN B12 Take 1,000 mcg by mouth daily.   docusate sodium 100 MG capsule Commonly known as: COLACE Take 1 capsule (100 mg total) by mouth 2 (two) times daily.   famotidine 20 MG tablet Commonly known as: PEPCID Take 20 mg by mouth 2 (two) times daily.   ferrous sulfate 325 (65 FE) MG tablet Take 325 mg by mouth at bedtime.   Fish Oil 1200 MG Caps Take 1,200 mg by mouth at bedtime.   fluticasone 50 MCG/ACT nasal spray Commonly known as: FLONASE Place 1  spray into both nostrils daily as needed for allergies or rhinitis.   gabapentin 300 MG capsule Commonly known as: NEURONTIN Take 600 mg by mouth at bedtime.   hydrALAZINE 25 MG tablet Commonly known as: APRESOLINE Take 75 mg by mouth in the morning and at bedtime.   HYDROcodone-acetaminophen 5-325 MG tablet Commonly known as: NORCO/VICODIN Take 1 tablet by mouth every 4 (four) hours as needed for moderate pain (pain score 4-6).   isosorbide mononitrate 30 MG 24 hr tablet Commonly known as: IMDUR Take 30 mg by mouth daily.   Jardiance 10 MG Tabs tablet Generic drug: empagliflozin Take 10 mg by mouth daily.   Lasix 20 MG tablet Generic drug: furosemide Take 10 mg by mouth daily as needed for edema.   lisinopril 20 MG tablet Commonly known as: ZESTRIL Take 20 mg by mouth at bedtime.   MAGNESIUM GLYCINATE PO Take 200 mg by mouth See admin instructions. Take 600 mg in the morning and 400 mg at bedtime   Melatonin 10 MG Tabs Take 10 mg by mouth at bedtime as needed (sleep).   methocarbamol 500 MG tablet Commonly known as: ROBAXIN Take 1 tablet (500 mg total) by mouth every 8 (eight) hours as needed for muscle spasms.   metoprolol succinate 50 MG 24 hr tablet Commonly known as: TOPROL-XL Take 50 mg by mouth daily.   MULTIVITAMIN ADULT PO Take 1 tablet by mouth daily.   niacin 500 MG tablet Commonly known as: (VITAMIN B3) Take 500 mg by mouth daily.   Ozempic (0.25 or 0.5 MG/DOSE) 2 MG/3ML Sopn Generic drug: Semaglutide(0.25 or 0.5MG /DOS) Inject 0.5 mg into the skin once a week. In the morning   Synthroid 75 MCG tablet Generic drug: levothyroxine Take 75 mcg by mouth daily before breakfast.   Systane 0.4-0.3 % Soln Generic drug: Polyethyl Glycol-Propyl Glycol Take 1 drop by mouth daily as needed (Dry eyes).   Vitamin D 125 MCG (5000 UT) Caps Take 5,000 Units by mouth daily.   VITAMIN K2 PO Take 45 mg by mouth daily.   zolpidem 12.5 MG CR tablet Commonly  known as: AMBIEN CR Take 12.5 mg by mouth at bedtime.        Diagnostic Studies: XR Shoulder Left  Result Date: 06/05/2023 AP axillary outlet radiographs left shoulder reviewed.  Reverse shoulder prosthesis in good position alignment with no complicating features.  Shoulder is located.  No acute fracture.  Visualized lung fields clear.  DG Shoulder Left Port  Result Date: 05/27/2023 CLINICAL DATA:  Status post left shoulder reverse arthroplasty EXAM: LEFT SHOULDER COMPARISON:  02/11/2022 FINDINGS:  The reverse left total shoulder prosthesis is present with expected orientation. No periprosthetic fracture on this single view. Left axillary clips noted. There is likely some mild atelectasis in the left lower lobe. Lower cervical plate and screw fixator noted. Thoracic spondylosis is present. IMPRESSION: 1. Left reverse total shoulder prosthesis without early complicating feature. 2. Mild atelectasis in the left lower lobe. Electronically Signed   By: Gaylyn Rong M.D.   On: 05/27/2023 17:47    Disposition: Discharge disposition: 01-Home or Self Care       Discharge Instructions     Call MD / Call 911   Complete by: As directed    If you experience chest pain or shortness of breath, CALL 911 and be transported to the hospital emergency room.  If you develope a fever above 101 F, pus (white drainage) or increased drainage or redness at the wound, or calf pain, call your surgeon's office.   Constipation Prevention   Complete by: As directed    Drink plenty of fluids.  Prune juice may be helpful.  You may use a stool softener, such as Colace (over the counter) 100 mg twice a day.  Use MiraLax (over the counter) for constipation as needed.   Diet - low sodium heart healthy   Complete by: As directed    Discharge instructions   Complete by: As directed    You may shower, dressing is waterproof.  Do not bathe or soak the operative shoulder in a tub, pool.  Use the CPM machine 3 times a  day for one hour each time.  No lifting with the operative shoulder. Continue use of the sling.  Follow-up with Dr. August Saucer or Drema Pry in ~2 weeks on your given appointment date.  We will remove your adhesive bandage at that time.    Dental Antibiotics:  In most cases prophylactic antibiotics for Dental procdeures after total joint surgery are not necessary.  Exceptions are as follows:  1. History of prior total joint infection  2. Severely immunocompromised (Organ Transplant, cancer chemotherapy, Rheumatoid biologic meds such as Humera)  3. Poorly controlled diabetes (A1C &gt; 8.0, blood glucose over 200)  If you have one of these conditions, contact your surgeon for an antibiotic prescription, prior to your dental procedure.   Increase activity slowly as tolerated   Complete by: As directed    Post-operative opioid taper instructions:   Complete by: As directed    POST-OPERATIVE OPIOID TAPER INSTRUCTIONS: It is important to wean off of your opioid medication as soon as possible. If you do not need pain medication after your surgery it is ok to stop day one. Opioids include: Codeine, Hydrocodone(Norco, Vicodin), Oxycodone(Percocet, oxycontin) and hydromorphone amongst others.  Long term and even short term use of opiods can cause: Increased pain response Dependence Constipation Depression Respiratory depression And more.  Withdrawal symptoms can include Flu like symptoms Nausea, vomiting And more Techniques to manage these symptoms Hydrate well Eat regular healthy meals Stay active Use relaxation techniques(deep breathing, meditating, yoga) Do Not substitute Alcohol to help with tapering If you have been on opioids for less than two weeks and do not have pain than it is ok to stop all together.  Plan to wean off of opioids This plan should start within one week post op of your joint replacement. Maintain the same interval or time between taking each dose and first  decrease the dose.  Cut the total daily intake of opioids by one tablet each day Next  start to increase the time between doses. The last dose that should be eliminated is the evening dose.             SignedKarenann Cai 06/20/2023, 11:26 AM

## 2023-07-04 ENCOUNTER — Ambulatory Visit (INDEPENDENT_AMBULATORY_CARE_PROVIDER_SITE_OTHER): Payer: Medicare Other | Admitting: Surgical

## 2023-07-04 ENCOUNTER — Encounter: Payer: Self-pay | Admitting: Orthopedic Surgery

## 2023-07-04 DIAGNOSIS — Z96612 Presence of left artificial shoulder joint: Secondary | ICD-10-CM

## 2023-07-04 NOTE — Progress Notes (Signed)
Post-Op Visit Note   Patient: Kristin [name]           Date of Birth: 08-19-1946           MRN: [id number] Visit Date: 07/04/2023 PCP: System, Provider Not In   Assessment & Plan:  Chief Complaint:  Chief Complaint  Patient presents with   Left Shoulder - Routine Post Op    05/27/23 left RSA with BT   Visit Diagnoses:  1. History of arthroplasty of left shoulder     Plan: Kristin [name] is a 77 y.o. female who presents s/p left reverse shoulder arthroplasty on 05/27/2023.  Patient is doing well and pain is overall controlled.  She is in physical therapy for the last 2 weeks going 3 times per week.  Not taking any muscle relaxers or opioid pain medication any longer.  She is taking Tylenol and ibuprofen on occasion.  She sleeps okay at night with sleeping through the night without difficulty and 3-4 nights out of the week but occasionally she will have to spend some time on the recliner.  Using ice and heat.  Doing some scar massage with her physical therapist.  Denies any chest pain, SOB, fevers, chills.  No complaint of any instability symptoms.    On exam, patient has range of motion 40 degrees X rotation, 65 degrees abduction, 100 degrees forward elevation passively and actively incision is healing.  Intact EPL, FPL, finger abduction, finger adduction, pronation/supination, bicep, tricep, deltoid of operative extremity.  Axillary nerve intact with deltoid firing.  Incision is healing well without evidence of infection or dehiscence.  Subscap strength rated 5 -/5.  Plan is discontinue sling full-time.  She is okay to start some light strengthening exercises in physical therapy but avoid internal rotation strengthening for the next 2 weeks to facilitate subscapularis healing continuing.  No external rotation past 40 degrees.  Okay for full active and passive range of motion aside from this external rotation limitation.  Follow-up for likely final check in 6 weeks with Dr. August Saucer.   Overall she is progressing very well.  Main thing she wants to be able to do at this point is be able to brush her hair with just her left arm without assistance.  She has noticed steady improvement without any plateau yet.  Follow-Up Instructions: No follow-ups on file.   Orders:  No orders of the defined types were placed in this encounter.  No orders of the defined types were placed in this encounter.   Imaging: No results found.  PMFS History: Patient Active Problem List   Diagnosis Date Noted   Arthritis of left shoulder region 06/08/2023   Biceps tendonitis on left 06/08/2023   S/P reverse total shoulder arthroplasty, left 05/27/2023   AKI (acute kidney injury) (HCC) 08/25/2022   Diarrhea 08/25/2022   Choledocholithiasis 08/25/2022   Nausea and vomiting 08/25/2022   HTN (hypertension) 08/25/2022   HLD (hyperlipidemia) 08/25/2022   Hypothyroidism 08/25/2022   GERD (gastroesophageal reflux disease) 08/25/2022   OSA (obstructive sleep apnea) 08/25/2022   Hemorrhoid 08/25/2022   Unilateral primary osteoarthritis, right knee 01/14/2022   Anemia 04/26/2020   RBBB 04/26/2020   Diabetes (HCC) 04/26/2020   QT prolongation 04/26/2020   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/2017   Closed nondisplaced fracture of head of right radius with routine healing 07/10/2017   Chronic pain of left knee 07/10/2017   Acute pain of left knee 07/10/2017   Unilateral primary osteoarthritis, left knee 07/10/2017  Closed nondisplaced fracture of head of right radius 06/09/2017   Past Medical History:  Diagnosis Date   Anemia 1992   Arthritis    oa   Breast CA (HCC) 1992   lumpectomy with chemo and radiation left breast   Chronic kidney disease stage 3    watching creatining levels sees dr nwbu   Diabetes mellitus without complication (HCC)    type 2    GERD (gastroesophageal reflux disease)    Hiatal hernia    High cholesterol    Hypertension    Hypothyroidism    Personal  history of radiation therapy    Sleep apnea     Family History  Problem Relation Age of Onset   Breast cancer Mother 28    Past Surgical History:  Procedure Laterality Date   BACK SURGERY     upper and lower upper back has clamp   BICEPT TENODESIS Left 05/27/2023   Procedure: BICEPS TENODESIS;  Surgeon: Cammy Copa, MD;  Location: [facility] OR;  Service: Orthopedics;  Laterality: Left;   BREAST LUMPECTOMY Left    and lymph nodes removed (16-18)   CARDIAC CATHETERIZATION  01/29/2022   CATARACT EXTRACTION Bilateral    CHOLECYSTECTOMY     COLONOSCOPY  10/2022   REVERSE SHOULDER ARTHROPLASTY Left 05/27/2023   Procedure: LEFT REVERSE SHOULDER ARTHROPLASTY;  Surgeon: Cammy Copa, MD;  Location: [facility] OR;  Service: Orthopedics;  Laterality: Left;   TONSILLECTOMY     TOTAL KNEE ARTHROPLASTY Left 09/12/2017   Procedure: LEFT TOTAL KNEE ARTHROPLASTY;  Surgeon: Kathryne Hitch, MD;  Location: [facility] ORS;  Service: Orthopedics;  Laterality: Left;   TUBAL LIGATION     Social History   Occupational History   Not on file  Tobacco Use   Smoking status: Never   Smokeless tobacco: Never  Vaping Use   Vaping status: Never Used  Substance and Sexual Activity   Alcohol use: Not Currently    Comment: rare   Drug use: No   Sexual activity: Yes    Birth control/protection: Post-menopausal

## 2023-08-13 ENCOUNTER — Encounter: Payer: Self-pay | Admitting: Orthopedic Surgery

## 2023-08-13 ENCOUNTER — Ambulatory Visit (INDEPENDENT_AMBULATORY_CARE_PROVIDER_SITE_OTHER): Payer: Medicare Other | Admitting: Orthopedic Surgery

## 2023-08-13 DIAGNOSIS — Z96612 Presence of left artificial shoulder joint: Secondary | ICD-10-CM

## 2023-08-13 NOTE — Progress Notes (Unsigned)
Post-Op Visit Note   Patient: Kristin [name]           Date of Birth: Jun 29, 1946           MRN: [id number] Visit Date: 08/13/2023 PCP: System, Provider Not In   Assessment & Plan:  Chief Complaint:  Chief Complaint  Patient presents with   Left Shoulder - Routine Post Op    05/27/23 left RSA with BT   Visit Diagnoses:  1. History of arthroplasty of left shoulder     Plan: Sadiyah is a patient is now about 10 weeks out left RSA with biceps tenodesis.  Doing therapy 3 times a week.  Doing exercises with massage and ultrasound.  Also doing home exercise program.  On examination that left shoulder has range of motion of 45/90/150.  She is able to brush her hair at this time.  Plan at this time is to continue therapy and to continue work on range of motion and strengthening.  Follow-up in 3 months for final check.  Follow-Up Instructions: No follow-ups on file.   Orders:  No orders of the defined types were placed in this encounter.  No orders of the defined types were placed in this encounter.   Imaging: No results found.  PMFS History: Patient Active Problem List   Diagnosis Date Noted   Arthritis of left shoulder region 06/08/2023   Biceps tendonitis on left 06/08/2023   S/P reverse total shoulder arthroplasty, left 05/27/2023   AKI (acute kidney injury) (HCC) 08/25/2022   Diarrhea 08/25/2022   Choledocholithiasis 08/25/2022   Nausea and vomiting 08/25/2022   HTN (hypertension) 08/25/2022   HLD (hyperlipidemia) 08/25/2022   Hypothyroidism 08/25/2022   GERD (gastroesophageal reflux disease) 08/25/2022   OSA (obstructive sleep apnea) 08/25/2022   Hemorrhoid 08/25/2022   Unilateral primary osteoarthritis, right knee 01/14/2022   Anemia 04/26/2020   RBBB 04/26/2020   Diabetes (HCC) 04/26/2020   QT prolongation 04/26/2020   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/2017   Closed nondisplaced fracture of head of right radius with routine healing 07/10/2017    Chronic pain of left knee 07/10/2017   Acute pain of left knee 07/10/2017   Unilateral primary osteoarthritis, left knee 07/10/2017   Closed nondisplaced fracture of head of right radius 06/09/2017   Past Medical History:  Diagnosis Date   Anemia 1992   Arthritis    oa   Breast CA (HCC) 1992   lumpectomy with chemo and radiation left breast   Chronic kidney disease stage 3    watching creatining levels sees dr nwbu   Diabetes mellitus without complication (HCC)    type 2    GERD (gastroesophageal reflux disease)    Hiatal hernia    High cholesterol    Hypertension    Hypothyroidism    Personal history of radiation therapy    Sleep apnea     Family History  Problem Relation Age of Onset   Breast cancer Mother 62    Past Surgical History:  Procedure Laterality Date   BACK SURGERY     upper and lower upper back has clamp   BICEPT TENODESIS Left 05/27/2023   Procedure: BICEPS TENODESIS;  Surgeon: Cammy Copa, MD;  Location: [facility] OR;  Service: Orthopedics;  Laterality: Left;   BREAST LUMPECTOMY Left    and lymph nodes removed (16-18)   CARDIAC CATHETERIZATION  01/29/2022   CATARACT EXTRACTION Bilateral    CHOLECYSTECTOMY     COLONOSCOPY  10/2022   REVERSE  SHOULDER ARTHROPLASTY Left 05/27/2023   Procedure: LEFT REVERSE SHOULDER ARTHROPLASTY;  Surgeon: Cammy Copa, MD;  Location: [facility] OR;  Service: Orthopedics;  Laterality: Left;   TONSILLECTOMY     TOTAL KNEE ARTHROPLASTY Left 09/12/2017   Procedure: LEFT TOTAL KNEE ARTHROPLASTY;  Surgeon: Kathryne Hitch, MD;  Location: [facility] ORS;  Service: Orthopedics;  Laterality: Left;   TUBAL LIGATION     Social History   Occupational History   Not on file  Tobacco Use   Smoking status: Never   Smokeless tobacco: Never  Vaping Use   Vaping status: Never Used  Substance and Sexual Activity   Alcohol use: Not Currently    Comment: rare   Drug use: No   Sexual activity: Yes    Birth control/protection:  Post-menopausal

## 2023-09-05 ENCOUNTER — Encounter: Payer: Self-pay | Admitting: *Deleted

## 2023-09-05 NOTE — Progress Notes (Signed)
Reached out to Kristin [name] to introduce myself as the office RN Navigator and explain our new patient process. Reviewed the reason for their referral and scheduled their new patient appointment along with labs. Provided address and directions to the office including call back phone number. Reviewed with patient any concerns they may have or any possible barriers to attending their appointment.   Informed patient about my role as a navigator and that I will meet with them prior to their New Patient appointment and more fully discuss what services I can provide. At this time patient has no further questions or needs.    Oncology Nurse Navigator Documentation     09/05/2023   12:30 PM  Oncology Nurse Navigator Flowsheets  Abnormal Finding Date 08/28/2023  Confirmed Diagnosis Date 09/03/2023  Navigator Location [facility]  Navigator Encounter Type Introductory Phone Call  Patient Visit Type MedOnc  Treatment Phase Pre-Tx/Tx Discussion  Barriers/Navigation Needs Coordination of Care;Education  Education Other  Interventions Coordination of Care;Education  Acuity Level 2-Minimal Needs (1-2 Barriers Identified)  Coordination of Care Appts  Education Method Verbal  Support Groups/Services Friends and Family  Time Spent with Patient 30

## 2023-09-12 ENCOUNTER — Inpatient Hospital Stay (HOSPITAL_BASED_OUTPATIENT_CLINIC_OR_DEPARTMENT_OTHER): Payer: Medicare Other | Admitting: Hematology & Oncology

## 2023-09-12 ENCOUNTER — Inpatient Hospital Stay: Payer: Medicare Other | Attending: Hematology & Oncology

## 2023-09-12 ENCOUNTER — Other Ambulatory Visit: Payer: Self-pay

## 2023-09-12 ENCOUNTER — Encounter: Payer: Self-pay | Admitting: Hematology & Oncology

## 2023-09-12 VITALS — BP 114/63 | HR 66 | Temp 98.3°F | Resp 19 | Ht 65.0 in | Wt 236.0 lb

## 2023-09-12 DIAGNOSIS — N6320 Unspecified lump in the left breast, unspecified quadrant: Secondary | ICD-10-CM | POA: Insufficient documentation

## 2023-09-12 DIAGNOSIS — Z79899 Other long term (current) drug therapy: Secondary | ICD-10-CM | POA: Diagnosis not present

## 2023-09-12 DIAGNOSIS — Z853 Personal history of malignant neoplasm of breast: Secondary | ICD-10-CM | POA: Diagnosis not present

## 2023-09-12 DIAGNOSIS — Z9221 Personal history of antineoplastic chemotherapy: Secondary | ICD-10-CM

## 2023-09-12 DIAGNOSIS — Z803 Family history of malignant neoplasm of breast: Secondary | ICD-10-CM | POA: Insufficient documentation

## 2023-09-12 DIAGNOSIS — Z923 Personal history of irradiation: Secondary | ICD-10-CM

## 2023-09-12 DIAGNOSIS — C50011 Malignant neoplasm of nipple and areola, right female breast: Secondary | ICD-10-CM

## 2023-09-12 LAB — LACTATE DEHYDROGENASE: LDH: 147 U/L (ref 98–192)

## 2023-09-12 LAB — CBC WITH DIFFERENTIAL (CANCER CENTER ONLY)
Abs Immature Granulocytes: 0.04 10*3/uL (ref 0.00–0.07)
Basophils Absolute: 0 10*3/uL (ref 0.0–0.1)
Basophils Relative: 0 %
Eosinophils Absolute: 0.2 10*3/uL (ref 0.0–0.5)
Eosinophils Relative: 2 %
HCT: 36.5 % (ref 36.0–46.0)
Hemoglobin: 12 g/dL (ref 12.0–15.0)
Immature Granulocytes: 0 %
Lymphocytes Relative: 18 %
Lymphs Abs: 1.8 10*3/uL (ref 0.7–4.0)
MCH: 33.1 pg (ref 26.0–34.0)
MCHC: 32.9 g/dL (ref 30.0–36.0)
MCV: 100.6 fL — ABNORMAL HIGH (ref 80.0–100.0)
Monocytes Absolute: 0.6 10*3/uL (ref 0.1–1.0)
Monocytes Relative: 6 %
Neutro Abs: 7.2 10*3/uL (ref 1.7–7.7)
Neutrophils Relative %: 74 %
Platelet Count: 207 10*3/uL (ref 150–400)
RBC: 3.63 MIL/uL — ABNORMAL LOW (ref 3.87–5.11)
RDW: 13.2 % (ref 11.5–15.5)
WBC Count: 9.8 10*3/uL (ref 4.0–10.5)
nRBC: 0 % (ref 0.0–0.2)

## 2023-09-12 LAB — CMP (CANCER CENTER ONLY)
ALT: 18 U/L (ref 0–44)
AST: 18 U/L (ref 15–41)
Albumin: 4 g/dL (ref 3.5–5.0)
Alkaline Phosphatase: 65 U/L (ref 38–126)
Anion gap: 8 (ref 5–15)
BUN: 57 mg/dL — ABNORMAL HIGH (ref 8–23)
CO2: 25 mmol/L (ref 22–32)
Calcium: 9.3 mg/dL (ref 8.9–10.3)
Chloride: 103 mmol/L (ref 98–111)
Creatinine: 2.03 mg/dL — ABNORMAL HIGH (ref 0.44–1.00)
GFR, Estimated: 25 mL/min — ABNORMAL LOW (ref >60)
Glucose, Bld: 108 mg/dL — ABNORMAL HIGH (ref 70–99)
Potassium: 4.6 mmol/L (ref 3.5–5.1)
Sodium: 136 mmol/L (ref 135–145)
Total Bilirubin: 0.5 mg/dL (ref 0.3–1.2)
Total Protein: 6.6 g/dL (ref 6.5–8.1)

## 2023-09-12 NOTE — Progress Notes (Signed)
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  HPI: Kristin [name] is a very charming 77 year old postmenopausal white female.  She catches the wife of one of my patients.  She does have interesting history.  She apparently had a invasive ductal carcinoma of the left breast back when she was I think 77 years old.  She said that there were no lymph nodes that were positive.  She had chemotherapy with Adriamycin/Cytoxan/5-FU (CAF).  She had 6 cycles.  She had radiation therapy afterwards.  She has been very diligent with getting her mammograms.  She had a screening mammogram on 08/20/2023.  This showed some distortion, possible mass and calcifications in the left breast.  Right breast is unremarkable.  On 08/28/2023.  She had a diagnostic mammogram.  This showed a suspicious 1.5 cm mass in the left breast at the 12 o'clock position.  It was a 9 mm mass at the 9 o'clock position.  Suspicious calcifications in the inner left breast measured 7 cm.  She then underwent biopsy.  This was done on 09/03/2023.  Apparently, she was found to have an invasive ductal carcinoma in both biopsies.  I am not clear as to the actual prognostic markers but the report that I saw show that a tumor or possibly both tumors were ER+/PR+/ HER-2 (2+).  On FISH, she was HER2 negative.  She wishes to have a bilateral mastectomy.  She had a last echocardiogram on 11/14/2022.  This showed an ejection fraction of 45%.  She does have mild renal insufficiency with a BUN of 57 creatinine 2.03.  Overall, she is okay with her health.  She recently had a left shoulder repair.  She got through this without any difficulties.  She has improved range of motion of the left shoulder.  Her mother had breast cancer when  she was 27 years old.  She needs to have some genetic testing.  Apparently, her 2 daughters were tested for the all BRCA gene are found to be negative.  She has not been on any type of postmenopausal estrogens.  She does not smoke.  She really does not drink alcohol.  There has been no problems with COVID.  Currently, I would say performance status is probably ECOG 1.  Past Medical History:  Diagnosis Date   Anemia 1992   Arthritis    oa   Breast CA (HCC) 1992   lumpectomy with chemo and radiation left breast   Chronic kidney disease stage 3    watching creatining levels sees dr nwbu   Diabetes mellitus without complication (HCC)    type 2    GERD (gastroesophageal reflux disease)    Hiatal hernia    High cholesterol    Hypertension    Hypothyroidism    Personal history of radiation therapy    Sleep apnea   :   Past Surgical History:  Procedure Laterality Date   BACK SURGERY     upper and lower upper back has clamp   BICEPT TENODESIS Left 05/27/2023   Procedure: BICEPS TENODESIS;  Surgeon: Cammy Copa, MD;  Location: [facility] OR;  Service: Orthopedics;  Laterality: Left;   BREAST LUMPECTOMY Left    and lymph nodes removed (16-18)   CARDIAC CATHETERIZATION  01/29/2022   CATARACT  EXTRACTION Bilateral    CHOLECYSTECTOMY     COLONOSCOPY  10/2022   REVERSE SHOULDER ARTHROPLASTY Left 05/27/2023   Procedure: LEFT REVERSE SHOULDER ARTHROPLASTY;  Surgeon: Cammy Copa, MD;  Location: [facility] OR;  Service: Orthopedics;  Laterality: Left;   TONSILLECTOMY     TOTAL KNEE ARTHROPLASTY Left 09/12/2017   Procedure: LEFT TOTAL KNEE ARTHROPLASTY;  Surgeon: Kathryne Hitch, MD;  Location: [facility] ORS;  Service: Orthopedics;  Laterality: Left;   TUBAL LIGATION    :   Current Outpatient Medications:    TRULICITY 4.5 MG/0.5ML SOPN, by Subcutaneous Infusion route., Disp: , Rfl:    Vitamin D-Vitamin K (VITAMIN K2-VITAMIN D3 PO), Take 1 tablet by mouth daily., Disp: , Rfl:     aspirin EC 81 MG tablet, Take 81 mg by mouth every other day. Swallow whole., Disp: , Rfl:    atorvastatin (LIPITOR) 40 MG tablet, Take 40 mg by mouth at bedtime., Disp: , Rfl:    calcium carbonate (CALCIUM 600) 600 MG TABS tablet, Take 600 mg by mouth 2 (two) times daily with a meal., Disp: , Rfl:    colestipol (COLESTID) 1 g tablet, Take 1 g by mouth daily as needed (diarrhea)., Disp: , Rfl:    cyanocobalamin (VITAMIN B12) 1000 MCG tablet, Take 1,000 mcg by mouth daily., Disp: , Rfl:    famotidine (PEPCID) 20 MG tablet, Take 20 mg by mouth 2 (two) times daily., Disp: , Rfl:    ferrous sulfate 325 (65 FE) MG tablet, Take 325 mg by mouth at bedtime., Disp: , Rfl:    fluticasone (FLONASE) 50 MCG/ACT nasal spray, Place 1 spray into both nostrils daily as needed for allergies or rhinitis., Disp: , Rfl:    furosemide (LASIX) 20 MG tablet, Take 10 mg by mouth daily as needed for edema., Disp: , Rfl:    gabapentin (NEURONTIN) 300 MG capsule, Take 600 mg by mouth at bedtime., Disp: , Rfl:    hydrALAZINE (APRESOLINE) 25 MG tablet, Take 75 mg by mouth in the morning and at bedtime., Disp: , Rfl:    isosorbide mononitrate (IMDUR) 30 MG 24 hr tablet, Take 30 mg by mouth daily., Disp: , Rfl:    JARDIANCE 10 MG TABS tablet, Take 10 mg by mouth daily., Disp: , Rfl:    lisinopril (ZESTRIL) 20 MG tablet, Take 20 mg by mouth at bedtime., Disp: , Rfl:    MAGNESIUM GLYCINATE PO, Take 200 mg by mouth See admin instructions. Take 600 mg in the morning and 400 mg at bedtime, Disp: , Rfl:    Melatonin 10 MG TABS, Take 10 mg by mouth at bedtime as needed (sleep)., Disp: , Rfl:    metoprolol succinate (TOPROL-XL) 50 MG 24 hr tablet, Take 50 mg by mouth daily., Disp: , Rfl:    Multiple Vitamins-Minerals (MULTIVITAMIN ADULT PO), Take 1 tablet by mouth daily. , Disp: , Rfl:    niacin (SLO-NIACIN) 500 MG tablet, Take 500 mg by mouth daily., Disp: , Rfl:    Omega-3 Fatty Acids (FISH OIL) 1200 MG CAPS, Take 1,200 mg by mouth at  bedtime. , Disp: , Rfl:    Polyethyl Glycol-Propyl Glycol (SYSTANE) 0.4-0.3 % SOLN, Take 1 drop by mouth daily as needed (Dry eyes)., Disp: , Rfl:    SYNTHROID 75 MCG tablet, Take 75 mcg by mouth daily before breakfast., Disp: , Rfl:    zolpidem (AMBIEN CR) 12.5 MG CR tablet, Take 12.5 mg by mouth at bedtime. , Disp: , Rfl: :  :  Allergies  Allergen Reactions   Ibuprofen Other (See Comments)    Does not take due to kidney function    Levodopa Other (See Comments)    Increase of dopamine levels   Oxycodone Other (See Comments)    Pt cannot tolerate oxycodone- makes her "loopy"/ AMS  :   Family History  Problem Relation Age of Onset   Breast cancer Mother 65  :   Social History   Socioeconomic History   Marital status: Married    Spouse name: Not on file   Number of children: Not on file   Years of education: Not on file   Highest education level: Not on file  Occupational History   Not on file  Tobacco Use   Smoking status: Never   Smokeless tobacco: Never  Vaping Use   Vaping status: Never Used  Substance and Sexual Activity   Alcohol use: Not Currently    Comment: rare   Drug use: No   Sexual activity: Yes    Birth control/protection: Post-menopausal  Other Topics Concern   Not on file  Social History Narrative   Not on file   Social Determinants of Health   Financial Resource Strain: Low Risk  (08/28/2023)   Received from [facility]   Overall Financial Resource Strain (CARDIA)    Difficulty of Paying Living Expenses: Not hard at all  Food Insecurity: No Food Insecurity (08/28/2023)   Received from [facility]   Hunger Vital Sign    Worried About Running Out of Food in the Last Year: Never true    Ran Out of Food in the Last Year: Never true  Transportation Needs: No Transportation Needs (08/28/2023)   Received from [facility] - Transportation    Lack of Transportation (Medical): No    Lack of Transportation (Non-Medical): No   Physical Activity: Insufficiently Active (10/08/2021)   Received from [facility]   Exercise Vital Sign    Days of Exercise per Week: 4 days    Minutes of Exercise per Session: 20 min  Stress: No Stress Concern Present (10/08/2021)   Received from [facility] - Occupational Stress Questionnaire    Feeling of Stress : Only a little  Social Connections: Unknown (04/08/2022)   Received from [facility], [facility]   Social Network    Social Network: Not on file  Intimate Partner Violence: Not At Risk (08/25/2022)   Humiliation, Afraid, Rape, and Kick questionnaire    Fear of Current or Ex-Partner: No    Emotionally Abused: No    Physically Abused: No    Sexually Abused: No  :  Review of Systems  Constitutional: Negative.   HENT: Negative.    Eyes: Negative.   Respiratory: Negative.    Cardiovascular: Negative.   Gastrointestinal: Negative.   Genitourinary: Negative.   Musculoskeletal: Negative.   Skin: Negative.   Neurological: Negative.   Endo/Heme/Allergies: Negative.   Psychiatric/Behavioral: Negative.       Exam: Vital signs show temperature of 98.3.  Pulse 66.  Blood pressure 114/63.  Weight is 236 pounds.  @IPVITALS @ Physical Exam Vitals reviewed.  Constitutional:      Comments: Breast exam shows right breast with no masses, edema or erythema.  There is no right axillary adenopathy.  Left breast shows a healed lumpectomy scar at about the 12 o'clock position.  Is no obvious masses in the left breast.  There is no left  nipple discharge.  There is no left axillary adenopathy.  HENT:     Head: Normocephalic and atraumatic.  Eyes:     Pupils: Pupils are equal, round, and reactive to light.  Cardiovascular:     Rate and Rhythm: Normal rate and regular rhythm.     Heart sounds: Normal heart sounds.  Pulmonary:     Effort: Pulmonary effort is normal.     Breath sounds: Normal breath  sounds.  Abdominal:     General: Bowel sounds are normal.     Palpations: Abdomen is soft.  Musculoskeletal:        General: No tenderness or deformity. Normal range of motion.     Cervical back: Normal range of motion.  Lymphadenopathy:     Cervical: No cervical adenopathy.  Skin:    General: Skin is warm and dry.     Findings: No erythema or rash.  Neurological:     Mental Status: She is alert and oriented to person, place, and time.  Psychiatric:        Behavior: Behavior normal.        Thought Content: Thought content normal.        Judgment: Judgment normal.     Recent Labs    09/12/23 1124  WBC 9.8  HGB 12.0  HCT 36.5  PLT 207    Recent Labs    09/12/23 1124  NA 136  K 4.6  CL 103  CO2 25  GLUCOSE 108*  BUN 57*  CREATININE 2.03*  CALCIUM 9.3    Blood smear review: None  Pathology: See above    Assessment and Plan: Kristin [name] is a very charming 77 year old postmenopausal white female.  She has a remote history of a ER negative breast cancer.  This was back in 1992.  She was treated with chemotherapy for this and radiation therapy.  She now has what appears to be 2 new breast cancers in the left breast.  These tumors are ER positive/HER2 negative.  She would like to have a mastectomy which she clearly will have to needed.  She wants like to have a bilateral mastectomy.  I think the real key as to what we do in the adjuvant setting will be the Oncotype score.  We will have to get this when she has her surgery.  We will make referral to Dr. Harden Mo of [facility].  I know that he is incredibly skilled with respect to breast cancer surgery.  I know that he will be a very good fit for her.  He is incredibly compassionate and very personable.  From my point of view, I cannot think of any additional test that we need to do right now.  Only other thing I could think of might be an MRI.  We will see if Dr. Dwain Sarna would like to have an MRI  done of the left breast.  I do not see that we have to do any scans on her.  Her labs all look okay outside of the mild renal insufficiency.  We will make sure that we make a referral to genetics so that she can be evaluated given her history and her family history.  We will await the consultation from Dr. Dwain Sarna.  We will then plan for additional follow-up depending on when surgery will be.

## 2023-09-13 LAB — CANCER ANTIGEN 27.29: CA 27.29: 26.5 U/mL (ref 0.0–38.6)

## 2023-09-15 ENCOUNTER — Encounter: Payer: Self-pay | Admitting: *Deleted

## 2023-09-15 DIAGNOSIS — C50011 Malignant neoplasm of nipple and areola, right female breast: Secondary | ICD-10-CM

## 2023-09-15 NOTE — Progress Notes (Signed)
This navigator was not in the office for new patient appointment.   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 She has an appointment with Dr Dwain Sarna on 09/19/2023. Since this is her second diagnosis a referral to genetics has also been placed.   Will place social work consult per protocol.   Adjuvant treatment will be determined by Oncotype Recurrence Score which will be obtained from her surgical path.   Oncology Nurse Navigator Documentation     09/15/2023   11:30 AM  Oncology Nurse Navigator Flowsheets  Navigator Follow Up Date: 09/19/2023  Navigator Follow Up Reason: Review Note  Navigator Location [facility]  Navigator Encounter Type Appt/Treatment Plan Review  Patient Visit Type MedOnc  Treatment Phase Pre-Tx/Tx Discussion  Barriers/Navigation Needs Coordination of Care;Education  Interventions None Required  Acuity Level 2-Minimal Needs (1-2 Barriers Identified)  Support Groups/Services Friends and Family  Time Spent with Patient 30

## 2023-09-16 ENCOUNTER — Inpatient Hospital Stay: Payer: Medicare Other | Admitting: Licensed Clinical Social Worker

## 2023-09-16 NOTE — Progress Notes (Signed)
[facility] Clinical Social Work  Initial Assessment   Kristin [name] is a 77 y.o. year old female contacted by phone. Clinical Social Work was referred by nurse navigator for assessment of psychosocial needs.   SDOH (Social Determinants of Health) assessments performed: Yes   SDOH Screenings   Food Insecurity: No Food Insecurity (08/28/2023)   Received from [facility]  Housing: Low Risk  (08/25/2022)  Transportation Needs: No Transportation Needs (08/28/2023)   Received from [facility]  Utilities: Not At Risk (08/28/2023)   Received from [facility]  Financial Resource Strain: Low Risk  (08/28/2023)   Received from [facility]  Physical Activity: Insufficiently Active (10/08/2021)   Received from [facility]  Social Connections: Unknown (04/08/2022)   Received from [facility], [facility]  Stress: No Stress Concern Present (10/08/2021)   Received from [facility]  Tobacco Use: Low Risk  (09/15/2023)   Received from [facility]     Distress Screen completed: No     No data to display            Family/Social Information:  Housing Arrangement: patient lives with her husband. Family members/support persons in your life? 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  Coping/ Adjustment to diagnosis: Patient understands treatment plan and what happens next? yes Concerns about diagnosis and/or treatment: Overwhelmed by information Patient reported stressors: Adjusting to my illness Hopes and/or priorities: Her family, including twelve grandchildren. Patient enjoys time with family/ friends Current coping skills/ strengths: Ability for insight , Active sense of humor , Average or  above average intelligence , Capable of independent living , Communication skills , Financial means , General fund of knowledge , Motivation for treatment/growth , and Supportive family/friends     SUMMARY: Current SDOH Barriers:  None per patient.  Clinical Social Work Clinical Goal(s):  Explore community activities.  Interventions: Discussed common feeling and emotions when being diagnosed with cancer, and the importance of support during treatment Informed patient of the support team roles and support services at [facility] Provided CSW contact information and encouraged patient to call with any questions or concerns Provided patient with information about the [employer].  Will mail brochure and [facility] and Wellness information.  Per her request, sent a secure message to the Nurse Navigator that patient is receiving a pacemaker next Tuesday.   Follow Up Plan: Patient will contact CSW with any support or resource needs Patient verbalizes understanding of plan: Yes    Dorothey Baseman, LCSW Clinical Social Worker [facility]

## 2023-09-17 ENCOUNTER — Other Ambulatory Visit: Payer: Self-pay | Admitting: Genetic Counselor

## 2023-09-17 ENCOUNTER — Inpatient Hospital Stay (HOSPITAL_BASED_OUTPATIENT_CLINIC_OR_DEPARTMENT_OTHER): Payer: Medicare Other | Admitting: Genetic Counselor

## 2023-09-17 ENCOUNTER — Other Ambulatory Visit: Payer: Self-pay

## 2023-09-17 ENCOUNTER — Inpatient Hospital Stay: Payer: Medicare Other

## 2023-09-17 DIAGNOSIS — Z1379 Encounter for other screening for genetic and chromosomal anomalies: Secondary | ICD-10-CM

## 2023-09-17 DIAGNOSIS — Z853 Personal history of malignant neoplasm of breast: Secondary | ICD-10-CM

## 2023-09-17 DIAGNOSIS — Z803 Family history of malignant neoplasm of breast: Secondary | ICD-10-CM | POA: Diagnosis not present

## 2023-09-17 LAB — GENETIC SCREENING ORDER

## 2023-09-18 ENCOUNTER — Encounter: Payer: Self-pay | Admitting: Genetic Counselor

## 2023-09-18 NOTE — Progress Notes (Signed)
REFERRING PROVIDER: Josph Macho, MD [address] [place],  [place] [address]  PRIMARY PROVIDER:  System, Provider Not In  PRIMARY REASON FOR VISIT:  1. Personal history of breast cancer   2. Family history of breast cancer    HISTORY OF PRESENT ILLNESS:   Kristin [name], a 77 y.o. female, was seen for a [place] cancer genetics consultation at the request of Dr. Myna Hidalgo due to a personal and family history of cancer.  Kristin [name] presents to clinic today to discuss the possibility of a hereditary predisposition to cancer, to discuss genetic testing, and to further clarify her future cancer risks, as well as potential cancer risks for family members.   Kristin [name] reports she was diagnosed with left breast cancer at age 6.  She said she had a lumpectomy, chemotherapy, and radiation as her treatment. In September 2024, at age 34, Kristin [name] was diagnosed with invasive ductal carcinoma of the left breast (ER/PR positive, HER2 negative). Kristin [name] is meeting with her surgeon, Dr. Dwain Sarna, tomorrow and said she plans to have bilateral mastectomies.   RISK FACTORS:  Menarche was at age 39.  First live birth at age 74.  OCP use for approximately 0 years.  Ovaries intact: yes.  Uterus intact: yes.  Menopausal status: postmenopausal.  HRT use: 0 years. Colonoscopy: yes;  she reports she has a history of colon polyps and repeats colonoscopies every 3 years . Mammogram within the last year: yes.  Past Medical History:  Diagnosis Date   Anemia 1992   Arthritis    oa   Breast CA (HCC) 1992   lumpectomy with chemo and radiation left breast   Chronic kidney disease stage 3    watching creatining levels sees dr nwbu   Diabetes mellitus without complication (HCC)    type 2    GERD (gastroesophageal reflux disease)    Hiatal hernia    High cholesterol    Hypertension    Hypothyroidism    Personal history of radiation therapy    Sleep apnea     Past Surgical History:   Procedure Laterality Date   BACK SURGERY     upper and lower upper back has clamp   BICEPT TENODESIS Left 05/27/2023   Procedure: BICEPS TENODESIS;  Surgeon: Cammy Copa, MD;  Location[facility] OR;  Service: Orthopedics;  Laterality: Left;   BREAST LUMPECTOMY Left    and lymph nodes removed (16-18)   CARDIAC CATHETERIZATION  01/29/2022   CATARACT EXTRACTION Bilateral    CHOLECYSTECTOMY     COLONOSCOPY  10/2022   REVERSE SHOULDER ARTHROPLASTY Left 05/27/2023   Procedure: LEFT REVERSE SHOULDER ARTHROPLASTY;  Surgeon: Cammy Copa, MD;  Location: [facility] OR;  Service: Orthopedics;  Laterality: Left;   TONSILLECTOMY     TOTAL KNEE ARTHROPLASTY Left 09/12/2017   Procedure: LEFT TOTAL KNEE ARTHROPLASTY;  Surgeon: Kathryne Hitch, MD;  Location: [facility] ORS;  Service: Orthopedics;  Laterality: Left;   TUBAL LIGATION      Social History   Socioeconomic History   Marital status: Married    Spouse name: Not on file   Number of children: Not on file   Years of education: Not on file   Highest education level: Not on file  Occupational History   Not on file  Tobacco Use   Smoking status: Never   Smokeless tobacco: Never  Vaping Use   Vaping status: Never Used  Substance and Sexual Activity   Alcohol use:  Not Currently    Comment: rare   Drug use: No   Sexual activity: Yes    Birth control/protection: Post-menopausal  Other Topics Concern   Not on file  Social History Narrative   Not on file   Social Determinants of Health   Financial Resource Strain: Low Risk  (08/28/2023)   Received [facility]   Overall Financial Resource Strain (CARDIA)    Difficulty of Paying Living Expenses: Not hard at all  Food Insecurity: No Food Insecurity (08/28/2023)   [facility]   Hunger Vital Sign    Worried About Running Out of Food in the Last Year: Never true    Ran Out of Food in the Last Year: Never true  Transportation Needs: No Transportation Needs  (08/28/2023)   Received from [facility] - Transportation    Lack of Transportation (Medical): No    Lack of Transportation (Non-Medical): No  Physical Activity: Insufficiently Active (10/08/2021)   [facility]   Exercise Vital Sign    Days of Exercise per Week: 4 days    Minutes of Exercise per Session: 20 min  Stress: No Stress Concern Present (10/08/2021)   Received from [facility] - Occupational Stress Questionnaire    Feeling of Stress : Only a little  Social Connections: Unknown (04/08/2022)   Received from [facility], [facility]   Social Network    Social Network: Not on file     FAMILY HISTORY:  We obtained a detailed, 4-generation family history.  Significant diagnoses are listed below: Family History  Problem Relation Age of Onset   Breast cancer Mother 40       metastatic   Cancer Maternal Uncle 51 - 61       unknown type   Breast cancer Cousin 8 - 25       maternal first cousin   Breast cancer Cousin 17       maternal first cousin     Kristin [name]'s mother was diagnosed with breast cancer at age 56, she died due to metastatic breast cancer at age 82. Her maternal uncle was diagnosed with an unknown type of cancer in his early 27s, he is deceased. Kristin [name] has 2 maternal first cousins who have a history of breast cancer, one was diagnosed at age 75 and the second was diagnosed in her 83s. Kristin [name] reports her two daughters have had negative hereditary cancer genetic testing. There is no reported Ashkenazi Jewish ancestry.   GENETIC COUNSELING ASSESSMENT: Kristin [name] is a 77 y.o. female with a personal and family history of cancer which is somewhat suggestive of a hereditary predisposition to cancer. We, therefore, discussed and recommended the following at today's visit.   DISCUSSION: We discussed that 5 - 10% of cancer is hereditary, with most cases of breast  cancer associated with BRCA1/2.  There are other genes that can be associated with hereditary breast cancer syndromes.  We discussed that testing is beneficial for several reasons including knowing how to follow individuals after completing their treatment, identifying whether potential treatment options would be beneficial, and understanding if other family members could be at risk for cancer and allowing them to undergo genetic testing.   We reviewed the characteristics, features and inheritance patterns of hereditary cancer syndromes. We also discussed genetic testing, including the appropriate family members to test, the process of testing, insurance coverage and turn-around-time  for results. We discussed the implications of a negative, positive, carrier and/or variant of uncertain significant result. We recommended Kristin [name] pursue genetic testing for a panel that includes genes associated with breast cancer.   Kristin [name]  was offered a common hereditary cancer panel (34 genes) and an expanded pan-cancer panel (70 genes). Kristin [name] was informed of the benefits and limitations of each panel, including that expanded pan-cancer panels contain genes that do not have clear management guidelines at this point in time.  We also discussed that as the number of genes included on a panel increases, the chances of variants of uncertain significance increases. After considering the benefits and limitations of each gene panel, Kristin [name] elected to have [employer].  The CancerNext gene panel offered by [employer] [facility] [employer] includes sequencing, rearrangement analysis, and RNA analysis for the following 34 genes:   APC, ATM, AXIN2, BARD1, BMPR1A, BRCA1, BRCA2, BRIP1, CDH1, CDK4, CDKN2A, CHEK2, DICER1, HOXB13, EPCAM, GREM1, MLH1, MSH2, MSH3, MSH6, MUTYH, NF1, NTHL1, PALB2, PMS2, POLD1, POLE, PTEN, RAD51C, RAD51D, SMAD4, SMARCA4, STK11, and TP53.   Based on Ms. Dau's personal and family history of cancer,  she meets medical criteria for genetic testing. Despite that she meets criteria, she may still have an out of pocket cost. We discussed that if her out of pocket cost for testing is over $100, the laboratory should contact them to discuss self-pay prices, patient pay assistance programs, if applicable, and other billing options.  PLAN: After considering the risks, benefits, and limitations, Kristin [name] provided informed consent to pursue genetic testing and the blood sample was sent to [facility] for analysis of the CancerNext Panel. Results should be available within approximately 2-3 weeks' time, at which point they will be disclosed by telephone to Kristin [name], as will any additional recommendations warranted by these results. Kristin [name] will receive a summary of her genetic counseling visit and a copy of her results once available. This information will also be available in Epic.   Kristin [name]'s questions were answered to her satisfaction today. Our contact information was provided should additional questions or concerns arise. Thank you for the referral and allowing [place] to share in the care of your patient.   Lalla Brothers, MS, [facility] Genetic Counselor [email or web address] (P) [phone]  The patient was seen for a total of 40 minutes in face-to-face genetic counseling. The patient was seen alone.  Drs. Pamelia Hoit and/or Mosetta Putt were available to discuss this case as needed.   _______________________________________________________________________ For Office Staff:  Number of people involved in session: 1 Was an Intern/ student involved with case: no

## 2023-09-23 ENCOUNTER — Encounter: Payer: Self-pay | Admitting: *Deleted

## 2023-09-23 NOTE — Progress Notes (Signed)
Patient was seen by [facility] on 09/19/2023 and plan for Left Mastectomy was made for 10/21/2023.  Oncology Nurse Navigator Documentation     09/23/2023    8:15 AM  Oncology Nurse Navigator Flowsheets  Planned Course of Treatment Surgery  Phase of Treatment Surgery  Expected Surgery Date 10/21/2023  Navigator Follow Up Date: 10/21/2023  Navigator Follow Up Reason: Surgery  Navigator Location [facility]  Navigator Encounter Type Appt/Treatment Plan Review  Patient Visit Type MedOnc  Treatment Phase Pre-Tx/Tx Discussion  Barriers/Navigation Needs Coordination of Care;Education  Interventions None Required  Acuity Level 2-Minimal Needs (1-2 Barriers Identified)  Support Groups/Services Friends and Family  Time Spent with Patient 15

## 2023-09-27 ENCOUNTER — Other Ambulatory Visit: Payer: Self-pay | Admitting: Hematology & Oncology

## 2023-09-27 MED ORDER — LETROZOLE 2.5 MG PO TABS: 2.5000 mg | ORAL_TABLET | Freq: Every day | ORAL | 12 refills | Status: DC

## 2023-10-02 ENCOUNTER — Telehealth: Payer: Self-pay | Admitting: Orthopedic Surgery

## 2023-10-02 MED ORDER — AMOXICILLIN 500 MG PO TABS: ORAL_TABLET | ORAL | 0 refills | Status: DC

## 2023-10-02 NOTE — Addendum Note (Signed)
Addended by: Barbette Or on: 10/02/2023 01:41 PM   Modules accepted: Orders

## 2023-10-02 NOTE — Telephone Encounter (Signed)
Pt requesting antibiotic before dental appt next Wednesday pleas advise

## 2023-10-02 NOTE — Telephone Encounter (Signed)
Sent to the pharmacy. I called and advised pt

## 2023-10-03 ENCOUNTER — Telehealth: Payer: Self-pay | Admitting: Genetic Counselor

## 2023-10-03 ENCOUNTER — Ambulatory Visit: Payer: Self-pay | Admitting: Genetic Counselor

## 2023-10-03 ENCOUNTER — Encounter: Payer: Self-pay | Admitting: Genetic Counselor

## 2023-10-03 DIAGNOSIS — Z1379 Encounter for other screening for genetic and chromosomal anomalies: Secondary | ICD-10-CM | POA: Insufficient documentation

## 2023-10-03 NOTE — Telephone Encounter (Signed)
I contacted Ms. Evrard to discuss her genetic testing results. No pathogenic variants were identified in the 34 genes analyzed. Detailed clinic note to follow.  The test report has been scanned into EPIC and is located under the Molecular Pathology section of the Results Review tab.  A portion of the result report is included below for reference.   Lalla Brothers, MS, [facility] Genetic Counselor [email or web address] (P) [phone]

## 2023-10-03 NOTE — Progress Notes (Signed)
HPI:   [name] was previously seen in the [facility] due to a personal and family history of cancer and concerns regarding a hereditary predisposition to cancer. Please refer to our prior cancer genetics clinic note for more information regarding our discussion, assessment and recommendations, at the time. [name] recent genetic test results were disclosed to her, as were recommendations warranted by these results. These results and recommendations are discussed in more detail below.  FAMILY HISTORY:  We obtained a detailed, 4-generation family history.  Significant diagnoses are listed below:      Family History  Problem Relation Age of Onset   Breast cancer Mother 19        metastatic   Cancer Maternal Uncle 36 - 47        unknown type   Breast cancer Cousin 25 - 33        maternal first cousin   Breast cancer Cousin 27        maternal first cousin           [name]'s mother was diagnosed with breast cancer at age 69, she died due to metastatic breast cancer at age 76. Her maternal uncle was diagnosed with an unknown type of cancer in his early 52s, he is deceased. [name] has 2 maternal first cousins who have a history of breast cancer, one was diagnosed at age 94 and the second was diagnosed in her 23s. [name] reports her two daughters have had negative hereditary cancer genetic testing. There is no reported Ashkenazi Jewish ancestry.    GENETIC TEST RESULTS:  The [facility] CancerNext Panel found no pathogenic mutations.  The CancerNext gene panel offered by [employer] includes sequencing, rearrangement analysis, and RNA analysis for the following 34 genes:   APC, ATM, AXIN2, BARD1, BMPR1A, BRCA1, BRCA2, BRIP1, CDH1, CDK4, CDKN2A, CHEK2, DICER1, HOXB13, EPCAM, GREM1, MLH1, MSH2, MSH3, MSH6, MUTYH, NF1, NTHL1, PALB2, PMS2, POLD1, POLE, PTEN, RAD51C, RAD51D, SMAD4, SMARCA4, STK11, and TP53.   The test report has been scanned into EPIC and is  located under the Molecular Pathology section of the Results Review tab.  A portion of the result report is included below for reference. Genetic testing reported out on 10/02/2023.       Even though a pathogenic variant was not identified, possible explanations for the cancer in the family may include: There may be no hereditary risk for cancer in the family. The cancers in [name] and/or her family may be due to other genetic or environmental factors. There may be a gene mutation in one of these genes that current testing methods cannot detect, but that chance is small. There could be another gene that has not yet been discovered, or that we have not yet tested, that is responsible for the cancer diagnoses in the family.  It is also possible there is a hereditary cause for the cancer in the family that [name] did not inherit.  Therefore, it is important to remain in touch with cancer genetics in the future so that we can continue to offer [name] the most up to date genetic testing.   ADDITIONAL GENETIC TESTING:  We discussed with [name] that her genetic testing was fairly extensive.  If there are genes identified to increase cancer risk that can be analyzed in the future, we would be happy to discuss and coordinate this testing at that time.    CANCER SCREENING RECOMMENDATIONS:  [name] test  result is considered negative (normal).  This means that we have not identified a hereditary cause for her personal and family history of cancer at this time.   An individual's cancer risk and medical management are not determined by genetic test results alone. Overall cancer risk assessment incorporates additional factors, including personal medical history, family history, and any available genetic information that may result in a personalized plan for cancer prevention and surveillance. Therefore, it is recommended she continue to follow the cancer management and screening guidelines  provided by her oncology and primary healthcare provider.  RECOMMENDATIONS FOR FAMILY MEMBERS:   Since she did not inherit a mutation in a cancer predisposition gene included on this panel, her children could not have inherited a mutation from her in one of these genes. Individuals in this family might be at some increased risk of developing cancer, over the general population risk, due to the family history of cancer. We recommend women in this family have a yearly mammogram beginning at age 91, or 7 years younger than the earliest onset of cancer, an annual clinical breast exam, and perform monthly breast self-exams.  Other members of the family may still carry a pathogenic variant in one of these genes that Ms. Boyack did not inherit. Based on the family history, we recommend her sister and maternal cousins who have a history of breast cancer consider genetic counseling and testing.   FOLLOW-UP:  Cancer genetics is a rapidly advancing field and it is possible that new genetic tests will be appropriate for her and/or her family members in the future. We encouraged her to remain in contact with cancer genetics on an annual basis so we can update her personal and family histories and let her know of advances in cancer genetics that may benefit this family.   Our contact number was provided. Ms. Sickinger's questions were answered to her satisfaction, and she knows she is welcome to call us at anytime with additional questions or concerns.   Kristin Brothers, MS, [facility] Genetic Counselor [name] (P) [phone]

## 2023-10-14 NOTE — Progress Notes (Signed)
Surgical Instructions   Your procedure is scheduled on Tuesday, November 12th, 2024. Report to [facility] Main Entrance "A" at 5:30 A.M., then check in with the Admitting office. Any questions or running late day of surgery: call [phone]  Questions prior to your surgery date: call [phone], Monday-Friday, 8am-4pm. If you experience any cold or flu symptoms such as cough, fever, chills, shortness of breath, etc. between now and your scheduled surgery, please notify us at the above number.     Remember:  Do not eat after midnight the night before your surgery   You may drink clear liquids until 4:30 the morning of your surgery.   Clear liquids allowed are: Water, Non-Citrus Juices (without pulp), Carbonated Beverages, Clear Tea (no milk, honey, etc.), Black Coffee Only (NO MILK, CREAM OR POWDERED CREAMER of any kind), and Gatorade.    Take these medicines the morning of surgery with A SIP OF WATER: Famotidine (Pepcid), Hydralazine (Apresoline), Isosorbide Mononitrate (IMDUR), Letrozole (Femera), Metoprolol Succinate (Toprol-XL), Synthroid   May take these medicines IF NEEDED:Colestipol (Colestid), Fluticasone (Flonase), eye drop    One week prior to surgery, STOP taking any Aspirin (unless otherwise instructed by your surgeon) Aleve, Naproxen, Ibuprofen, Motrin, Advil, Goody's, BC's, all herbal medications, fish oil, and non-prescription vitamins.  WHAT DO I DO ABOUT MY DIABETES MEDICATION?   Hold Trulicity for 7 days prior to surgery.  Hold Jardiance 72 hours prior to surgery.   The day of surgery, do not take other diabetes injectables, including Byetta (exenatide), Bydureon (exenatide ER), Victoza (liraglutide), or Trulicity (dulaglutide).    HOW TO MANAGE YOUR DIABETES BEFORE AND AFTER SURGERY  Why is it important to control my blood sugar before and after surgery? Improving blood sugar levels before and after surgery helps healing and can limit problems. A way  of improving blood sugar control is eating a healthy diet by:  Eating less sugar and carbohydrates  Increasing activity/exercise  Talking with your doctor about reaching your blood sugar goals High blood sugars (greater than 180 mg/dL) can raise your risk of infections and slow your recovery, so you will need to focus on controlling your diabetes during the weeks before surgery. Make sure that the doctor who takes care of your diabetes knows about your planned surgery including the date and location.  How do I manage my blood sugar before surgery? Check your blood sugar at least 4 times a day, starting 2 days before surgery, to make sure that the level is not too high or low.  Check your blood sugar the morning of your surgery when you wake up and every 2 hours until you get to the Short Stay unit.  If your blood sugar is less than 70 mg/dL, you will need to treat for low blood sugar: Do not take insulin. Treat a low blood sugar (less than 70 mg/dL) with  cup of clear juice (cranberry or apple), 4 glucose tablets, OR glucose gel. Recheck blood sugar in 15 minutes after treatment (to make sure it is greater than 70 mg/dL). If your blood sugar is not greater than 70 mg/dL on recheck, call [phone] for further instructions. Report your blood sugar to the short stay nurse when you get to Short Stay.  If you are admitted to the hospital after surgery: Your blood sugar will be checked by the staff and you will probably be given insulin after surgery (instead of oral diabetes medicines) to make sure you have good blood sugar levels. The goal for blood  sugar control after surgery is 80-180 mg/dL.                      Do NOT Smoke (Tobacco/Vaping) for 24 hours prior to your procedure.  If you use a CPAP at night, you may bring your mask/headgear for your overnight stay.   You will be asked to remove any contacts, glasses, piercing's, hearing aid's, dentures/partials prior to surgery. Please  bring cases for these items if needed.    Patients discharged the day of surgery will not be allowed to drive home, and someone needs to stay with them for 24 hours.  SURGICAL WAITING ROOM VISITATION Patients may have no more than 2 support people in the waiting area - these visitors may rotate.   Pre-op nurse will coordinate an appropriate time for 1 ADULT support person, who may not rotate, to accompany patient in pre-op.  Children under the age of 11 must have an adult with them who is not the patient and must remain in the main waiting area with an adult.  If the patient needs to stay at the hospital during part of their recovery, the visitor guidelines for inpatient rooms apply.  Please refer to the [facility] website for the visitor guidelines for any additional information.   If you received a COVID test during your pre-op visit  it is requested that you wear a mask when out in public, stay away from anyone that may not be feeling well and notify your surgeon if you develop symptoms. If you have been in contact with anyone that has tested positive in the last 10 days please notify you surgeon.      Pre-operative CHG Bathing Instructions   You can play a key role in reducing the risk of infection after surgery. Your skin needs to be as free of germs as possible. You can reduce the number of germs on your skin by washing with CHG (chlorhexidine gluconate) soap before surgery. CHG is an antiseptic soap that kills germs and continues to kill germs even after washing.   DO NOT use if you have an allergy to chlorhexidine/CHG or antibacterial soaps. If your skin becomes reddened or irritated, stop using the CHG and notify one of our RNs at [phone].              TAKE A SHOWER THE NIGHT BEFORE SURGERY AND THE DAY OF SURGERY    Please keep in mind the following:  DO NOT shave, including legs and underarms, 48 hours prior to surgery.   You may shave your face before/day of surgery.   Place clean sheets on your bed the night before surgery Use a clean washcloth (not used since being washed) for each shower. DO NOT sleep with pet's night before surgery.  CHG Shower Instructions:  Wash your face and private area with normal soap. If you choose to wash your hair, wash first with your normal shampoo.  After you use shampoo/soap, rinse your hair and body thoroughly to remove shampoo/soap residue.  Turn the water OFF and apply half the bottle of CHG soap to a CLEAN washcloth.  Apply CHG soap ONLY FROM YOUR NECK DOWN TO YOUR TOES (washing for 3-5 minutes)  DO NOT use CHG soap on face, private areas, open wounds, or sores.  Pay special attention to the area where your surgery is being performed.  If you are having back surgery, having someone wash your back for you may be helpful. Wait  2 minutes after CHG soap is applied, then you may rinse off the CHG soap.  Pat dry with a clean towel  Put on clean pajamas    Additional instructions for the day of surgery: DO NOT APPLY any lotions, deodorants, cologne, or perfumes.   Do not wear jewelry or makeup Do not wear nail polish, gel polish, artificial nails, or any other type of covering on natural nails (fingers and toes) Do not bring valuables to the hospital. [facility] is not responsible for valuables/personal belongings. Put on clean/comfortable clothes.  Please brush your teeth.  Ask your nurse before applying any prescription medications to the skin.

## 2023-10-15 ENCOUNTER — Other Ambulatory Visit (HOSPITAL_COMMUNITY): Payer: Medicare Other

## 2023-10-15 ENCOUNTER — Encounter (HOSPITAL_COMMUNITY): Payer: Self-pay

## 2023-10-15 ENCOUNTER — Other Ambulatory Visit: Payer: Self-pay

## 2023-10-15 ENCOUNTER — Encounter (HOSPITAL_COMMUNITY)
Admission: RE | Admit: 2023-10-15 | Discharge: 2023-10-15 | Disposition: A | Discharge status: Home / Self Care | Payer: Medicare Other | Source: Ambulatory Visit | Attending: General Surgery | Admitting: General Surgery

## 2023-10-15 ENCOUNTER — Other Ambulatory Visit: Payer: Self-pay | Admitting: General Surgery

## 2023-10-15 DIAGNOSIS — G4733 Obstructive sleep apnea (adult) (pediatric): Secondary | ICD-10-CM | POA: Diagnosis not present

## 2023-10-15 DIAGNOSIS — E1122 Type 2 diabetes mellitus with diabetic chronic kidney disease: Secondary | ICD-10-CM | POA: Insufficient documentation

## 2023-10-15 DIAGNOSIS — I129 Hypertensive chronic kidney disease with stage 1 through stage 4 chronic kidney disease, or unspecified chronic kidney disease: Secondary | ICD-10-CM | POA: Diagnosis not present

## 2023-10-15 DIAGNOSIS — I340 Nonrheumatic mitral (valve) insufficiency: Secondary | ICD-10-CM | POA: Insufficient documentation

## 2023-10-15 DIAGNOSIS — Z950 Presence of cardiac pacemaker: Secondary | ICD-10-CM | POA: Insufficient documentation

## 2023-10-15 DIAGNOSIS — N184 Chronic kidney disease, stage 4 (severe): Secondary | ICD-10-CM | POA: Diagnosis not present

## 2023-10-15 DIAGNOSIS — I453 Trifascicular block: Secondary | ICD-10-CM | POA: Diagnosis not present

## 2023-10-15 DIAGNOSIS — I429 Cardiomyopathy, unspecified: Secondary | ICD-10-CM | POA: Diagnosis not present

## 2023-10-15 DIAGNOSIS — Z01812 Encounter for preprocedural laboratory examination: Secondary | ICD-10-CM | POA: Insufficient documentation

## 2023-10-15 HISTORY — DX: Presence of cardiac pacemaker: Z95.0

## 2023-10-15 LAB — GLUCOSE, CAPILLARY: Glucose-Capillary: 120 mg/dL — ABNORMAL HIGH (ref 70–99)

## 2023-10-15 NOTE — Progress Notes (Signed)
PCP - Bill Salinas, NP Cardiologist - Dr. Dyane Dustman Pulmonologist- Dr. Lysle Pearl Nephrologist- Dr. Janit Pagan  PPM/ICD - Medtronic PPM Device Orders - requested (faxed) Rep Notified -   Chest x-ray - 10/01/23 CE EKG - 09/25/23 CE- tracing requested Stress Test - 2008-2009 per pt, at [facility] in [place] ECHO - 09/12/23 CE Cardiac Cath - 01/29/22  Sleep Study - OSA+ CPAP - nightly, unsure of pressure settings   Fasting Blood Sugar - 80-90 Checks Blood Sugar continuously (continuous monitor)  Last dose of GLP1 agonist-  10/06/23 GLP1 instructions: Hold 7 days prior to surgery (pt knows not to take any more doses prior to surgery)  Blood Thinner Instructions: n/a Aspirin Instructions: f/u with surgeon  ERAS Protcol - yes PRE-SURGERY G2- given at PAT  COVID TEST- n/a   Anesthesia review: yes, cardiac hx, medtronic PPM, pulmonary, nephrology; awaiting device orders  Patient denies shortness of breath, fever, cough and chest pain at PAT appointment   All instructions explained to the patient, with a verbal understanding of the material. Patient agrees to go over the instructions while at home for a better understanding.  The opportunity to ask questions was provided.

## 2023-10-16 NOTE — Anesthesia Preprocedure Evaluation (Addendum)
Anesthesia Evaluation  Patient identified by MRN, date of birth, ID band Patient awake    Reviewed: Allergy & Precautions, NPO status , Patient's Chart, lab work & pertinent test results  Airway Mallampati: III  TM Distance: <3 FB Neck ROM: Full    Dental no notable dental hx. (+) Teeth Intact, Dental Advisory Given   Pulmonary sleep apnea and Continuous Positive Airway Pressure Ventilation    Pulmonary exam normal breath sounds clear to auscultation       Cardiovascular hypertension, Pt. on medications and Pt. 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  Echo 09/12/2023 showed EF 50%, no significant valvular abnormalities.  She was last seen by cardiologist Dr. Chales Abrahams 09/15/2023 for discussion of pacemaker implant per note,  2021 Echo 1. Inferior basal hypokinesis . Left ventricular ejection fraction, by  estimation, is 50 to 55%. The left ventricle has low normal function. The  left ventricle demonstrates regional wall motion abnormalities (see  scoring diagram/findings for  description). There is mild left ventricular hypertrophy. Left ventricular  diastolic parameters are indeterminate.   2. Right ventricular systolic function is normal. The right ventricular  size is normal.   3. The mitral valve is normal in structure. Trivial mitral valve  regurgitation. No evidence of mitral stenosis.   4. The aortic valve is tricuspid. Aortic valve regurgitation is not  visualized. Mild aortic valve sclerosis is present, with no evidence of  aortic valve stenosis.   5. The inferior vena cava is normal in size with greater than 50%  respiratory variability, suggesting right atrial pressure of 3 mmHg.      Neuro/Psych negative neurological ROS  negative psych ROS   GI/Hepatic Neg liver ROS, hiatal  hernia,GERD  ,,  Endo/Other  diabetes, Type 2, Oral Hypoglycemic AgentsHypothyroidism  On GLP-1 check last dose  Renal/GU CRFRenal diseaseLab Results      Component                Value               Date                            K                        4.6                 09/12/2023                     BUN                      57 (H)              09/12/2023                CREATININE               2.03 (H)            09/12/2023                   Musculoskeletal  (+) Arthritis , Osteoarthritis,    Abdominal  (+) + obese (BMI 40)  Peds  Hematology  (+) Blood dyscrasia, anemia Lab Results      Component  Value               Date                      WBC                      9.8                 09/12/2023                HGB                      12.0                09/12/2023                HCT                      36.5                09/12/2023                MCV                      100.6 (H)           09/12/2023                PLT                      207                 09/12/2023                 Anesthesia Other Findings   Reproductive/Obstetrics                             Anesthesia Physical Anesthesia Plan  ASA: 4  Anesthesia Plan: General and Regional   Post-op Pain Management: Regional block*   Induction: Intravenous  PONV Risk Score and Plan: 3 and Ondansetron, Treatment may vary due to age or medical condition, Propofol infusion and TIVA  Airway Management Planned: Oral ETT  Additional Equipment: None  Intra-op Plan:   Post-operative Plan: Extubation in OR  Informed Consent: I have reviewed the patients History and Physical, chart, labs and discussed the procedure including the risks, benefits and alternatives for the proposed anesthesia with the patient or authorized representative who has indicated his/her understanding and acceptance.     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  Echo 09/12/2023 showed EF 50%, no significant valvular abnormalities.  She was last seen by cardiologist Dr. Chales Abrahams 09/15/2023 for discussion of pacemaker implant per note, "She is planning bilateral mastectomy for her breast cancer recurrence. Briefly, patient is known to have trifascicular blocker. She had a recent echocardiogram that demonstrated low-normal LVEF, mildly thickened aortic valve with no regurgitation, mild mitral valve insufficiency and thickened interatrial septum suggestive of lipomatous hypertrophy although a mass cannot be totally excluded. TEE was recommended. Patient did have a TEE in 2023 which did not show any cardiac mass or significant abnormality. We also discussed her conduction system disease and upcoming  surgery. Pacemaker implantation recommended so she can undergo surgery more safely. Reviewed procedure and associated risks. She is agreeable to have pacemaker implanted at the earliest available."   TIVA TIVA GA w L Pectoralis block    She did subsequently have right side Medtronic pacemaker implant on 09/23/2023 which was complicated by postoperative pneumothorax.  This resolved with chest tube placement.  Ambulatory desaturation study was completed and she was able to maintain O2 sats 95% walking on room air and was discharged on 10/01/2023.  Patient had follow-up wound check with cardiology on 10/06/2023 with no acute concerns.  Clearance from Dr. Chales Abrahams dated 10/08/2023 states patient is low risk to proceed.  Perioperative device instruction form has been sent to Dr. Marcille Blanco office.  Per Dr. Doreen Salvage note 09/19/2023, he did coordinate with Dr. Chales Abrahams on recent pacemaker implant and recommended right sided placement due to need for left mastectomy.  Prior history of  left breast cancer s/p lumpectomy and radiation 1992.  History of CKD 4.  Non-insulin-dependent DM2, A1c 5.8 on 09/25/2023.  She is on once weekly GLP-1 agonist, last dose 09/26/2023.  Preop labs reviewed, creatinine elevated 2.03 consistent with history of CKD 4, otherwise unremarkable.  EKG 08/25/2022 (in setting of ED admission for diarrheal illness with AKI on CKD): Junctional tachycardia.  Rate 103.  IVCD, consider atypical right bundle branch block.  LVH with IVCD, LAD and secondary repolarization abnormalities.  Prolonged QT.  When compared to prior, similar appearance with known right bundle branch block.  TTE 09/12/2023 (Care Everywhere): Adequate 2D M-mode and color-flow Doppler echocardiogram demonstrates  normal left ventricular chamber size. Wall thickness is normal. Left  ventricular function is low normal estimated with an ejection fraction of  50%.  2.The aortic valve is mildly thickened but appears to open adequately  3. The mitral valve is pliable and appears to open adequately. Mild  mitral insufficiency is noted  4. The tricuspid valve is normal  5. The left atrium is prominently enlarged  6. Right atrium is grossly normal  7. The interatrial septum appears to be somewhat thickened and this could  represent lipomatous hypertrophy although a mass cannot be totally  excluded  8.Right ventricular structure and function is normal  9. The pericardium is normal in thickness there is no effusion  10. TEE is suggested to exclude interatrial septal mass   Cardiac cath 01/29/2022 (Care Everywhere): DIAGNOSTIC FINDINGS   1. Normal coronary arteriogram. Right dominant system.  2. Normal LVEDP, 13 mmHg. A ventriculogram was not performed in order to  reduce contrast exposure in this patient with renal insufficiency.  3. Low normal cardiac output, cardiac output 4.4 L-min. Cardiac index  2.1  4.High-normal pulmonary artery pressure, 36-17 mmHg, mean 24  mmHg.       )        Anesthesia Quick Evaluation

## 2023-10-16 NOTE — Progress Notes (Signed)
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  Echo 09/12/2023 showed EF 50%, no significant valvular abnormalities.  She was last seen by cardiologist Dr. Chales Abrahams 09/15/2023 for discussion of pacemaker implant per note, "She is planning bilateral mastectomy for her breast cancer recurrence. Briefly, patient is known to have trifascicular blocker. She had a recent echocardiogram that demonstrated low-normal LVEF, mildly thickened aortic valve with no regurgitation, mild mitral valve insufficiency and thickened interatrial septum suggestive of lipomatous hypertrophy although a mass cannot be totally excluded. TEE was recommended. Patient did have a TEE in 2023 which did not show any cardiac mass or significant abnormality. We also discussed her conduction system disease and upcoming surgery. Pacemaker implantation recommended so she can undergo surgery more safely. Reviewed procedure and associated risks. She is agreeable to have pacemaker implanted at the earliest available."    She did subsequently have right side Medtronic pacemaker implant on 09/23/2023 which was complicated by postoperative pneumothorax.  This resolved with chest tube placement.  Ambulatory desaturation study was completed and she was able to maintain O2 sats 95% walking on room air and was discharged on 10/01/2023.  Patient had follow-up wound check with cardiology on 10/06/2023 with no acute concerns.  Clearance from Dr. Chales Abrahams dated 10/08/2023 states patient is low risk to proceed.  Perioperative device instruction form has been sent to Dr. Marcille Blanco office.  Per Dr. Doreen Salvage note 09/19/2023, he did coordinate with Dr. Chales Abrahams on recent pacemaker implant and recommended right sided placement due to need for left mastectomy.  Prior history of left breast cancer s/p lumpectomy and radiation 1992.    History of CKD 4.   Non-insulin-dependent DM2, A1c 5.8 on 09/25/2023.  She is on once weekly GLP-1 agonist, last dose 09/26/2023.   Preop labs reviewed, creatinine elevated 2.03 consistent with history of CKD 4, otherwise unremarkable.   EKG 08/25/2022 (in setting of ED admission for diarrheal illness with AKI on CKD): Junctional tachycardia.  Rate 103.  IVCD, consider atypical right bundle branch block.  LVH with IVCD, LAD and secondary repolarization abnormalities.  Prolonged QT.  When compared to prior, similar appearance with known right bundle branch block.   TTE 09/12/2023 (Care Everywhere): Adequate 2D M-mode and color-flow Doppler echocardiogram demonstrates  normal left ventricular chamber size.  Wall thickness is normal.  Left  ventricular function is low normal estimated with an ejection fraction of  50%.  2. The aortic valve is mildly thickened but appears to open adequately  3.  The mitral valve is pliable and appears to open adequately.  Mild  mitral insufficiency is noted  4.  The tricuspid valve is normal  5.  The left atrium is prominently enlarged  6.  Right atrium is grossly normal  7.  The interatrial septum appears to be somewhat thickened and this could  represent lipomatous hypertrophy although a mass cannot be totally  excluded  8. Right ventricular structure and function is normal  9.  The pericardium is normal in thickness there is no effusion  10.  TEE is suggested to exclude interatrial septal mass    Cardiac cath 01/29/2022 (Care Everywhere): DIAGNOSTIC FINDINGS     1.  Normal coronary arteriogram.  Right dominant system.  2.  Normal LVEDP, 13 mmHg.  A ventriculogram was not performed in order to  reduce contrast exposure in this patient with renal insufficiency.  3.  Low normal  cardiac output, cardiac output 4.4 L-min.  Cardiac index  2.1  4. High-normal pulmonary artery pressure, 36-17 mmHg, mean 24 mmHg.      [facility] Short Stay  Center/Anesthesiology Phone [phone] 10/16/2023 1:53 PM

## 2023-10-20 NOTE — H&P (Signed)
77 year old female who has a prior history when she was 77 years old of an invasive ductal carcinoma of the left breast. She underwent a lumpectomy as well as an axillary lymph node dissection and she states she had 14 nodes that were negative at that time. This was followed by radiation and chemotherapy. She has done well since then. On her a recent mammogram she was noted to have some distortion possible mass and calcifications in the left breast. The right breast was negative. Diagnostic mammogram showed a suspicious 1.5 cm mass in the left breast at the 12 o'clock position as well as a 9 mm mass at the 9 o'clock position. There are suspicious calcifications in the inner left breast measuring 7 cm. She underwent biopsy of 2 of these areas this was done on both of the masses. Her pathology returned as invasive ductal carcinoma. This is greater than 90% ER positive, 5% weak PR positive, Ki-67 is 18% and the HER2 on final is negative. This is a grade 2. She was then seen by oncology. She has been referred here for evaluation as well. She is here with her husband today to discuss options. She comes in desiring bilateral mastectomies.  Review of Systems: A complete review of systems was obtained from the patient. I have reviewed this information and discussed as appropriate with the patient. See HPI as well for other ROS.  Review of Systems  Gastrointestinal: Positive for diarrhea and heartburn.  All other systems reviewed and are negative.  Medical History: Past Medical History:  Diagnosis Date  Chronic kidney disease  Diabetes mellitus without complication (CMS/HHS-HCC)  GERD (gastroesophageal reflux disease)  History of cancer  Hyperlipidemia  Hypertension   Patient Active Problem List  Diagnosis  Malignant neoplasm of female breast (CMS/HHS-HCC)   Past Surgical History:  Procedure Laterality Date  CHOLECYSTECTOMY  JOINT REPLACEMENT   Allergies  Allergen Reactions  Levodopa Other  (See Comments)  INCREASE DOPAMINE LEVELS  Nsaids (Non-Steroidal Anti-Inflammatory Drug) Other (See Comments)  KIDNEY FUNCTION  Oxycodone Other (See Comments)  MAKES HER "LOOPY"   Current Outpatient Medications on File Prior to Visit  Medication Sig Dispense Refill  atorvastatin (LIPITOR) 40 MG tablet Take 40 mg by mouth once daily  colestipoL (COLESTID) 1 gram tablet at bedtime as needed  gabapentin (NEURONTIN) 300 mg/6 mL solution  isosorbide mononitrate (IMDUR) 30 MG ER tablet  JARDIANCE 10 mg tablet  levothyroxine (SYNTHROID) 75 MCG tablet  metoprolol succinate (TOPROL-XL) 50 MG XL tablet  TRULICITY 4.5 mg/0.5 mL subcutaneous pen injector Inject subcutaneously  zolpidem (AMBIEN CR) 12.5 MG CR tablet   Family History  Problem Relation Age of Onset  Obesity Mother  High blood pressure (Hypertension) Mother  Breast cancer Mother    Social History   Tobacco Use  Smoking Status Never  Smokeless Tobacco Never  Marital status: Married  Tobacco Use  Smoking status: Never  Smokeless tobacco: Never  Vaping Use  Vaping status: Never Used  Substance and Sexual Activity  Alcohol use: Not Currently  Drug use: Never    Objective:   Vitals:  09/19/23 1030  BP: (!) 144/82  Pulse: 89  Temp: 36.6 C (97.9 F)  SpO2: 99%  Weight: (!) 108.8 kg (239 lb 12.8 oz)  Height: 162.6 cm (5\' 4" )  PainSc: 0-No pain   Body mass index is 41.16 kg/m.  Physical Exam Vitals reviewed.  Constitutional:  Appearance: Normal appearance.  Chest:  Breasts: Right: No inverted nipple, mass or nipple discharge.  Left:  No inverted nipple, mass or nipple discharge.  Comments: Shrunken left breast c/w radiotherapy Lymphadenopathy:  Upper Body:  Right upper body: No supraclavicular or axillary adenopathy.  Left upper body: No supraclavicular or axillary adenopathy.  Neurological:  Mental Status: She is alert.   Assessment and Plan:   Malignant neoplasm of central portion of left breast in  female, estrogen receptor positive (CMS/HHS-HCC)  Left mastectomy  We discussed the staging and pathophysiology of breast cancer. We discussed all of the different options for treatment for breast cancer including surgery, chemotherapy, radiation therapy, Herceptin, and antiestrogen therapy.  We discussed a sentinel lymph node biopsy but as she is already had an axillary dissection I think this can be omitted.  We discussed the options for treatment of the breast cancer which included lumpectomy versus a mastectomy. We discussed the performance of the lumpectomy with radioactive seed placement. We discussed a 5-10% chance of a positive margin requiring reexcision in the operating room. We also discussed that she will likely need radiation therapy if she undergoes lumpectomy. We discussed mastectomy and the postoperative care for that as well. Mastectomy can be followed by reconstruction. The decision for lumpectomy vs mastectomy has no impact on decision for chemotherapy. Most mastectomy patients will not need radiation therapy. We discussed that there is no difference in her survival whether she undergoes lumpectomy with radiation therapy or antiestrogen therapy versus a mastectomy. There is also no real difference between her recurrence in the breast.  I think with her prior breast conservation therapy and the extent of disease the only real option is a mastectomy at this point. She does not want a reconstruction which I agree with. We discussed a total mastectomy with the risks and recovery associated with that. I am going to need to wait until she is cleared by cardiology. I will ask medical oncology to begin her on a antiestrogen for the time being.  We had a long discussion about the double mastectomy and what the benefit as well as the risk is. I do not think there is any benefit for her breast cancer at all. Especially if she is going to do the antiestrogen. We discussed that overall more surgery  in her is likely not the best thing. After this long discussion we elected to proceed with a unilateral mastectomy. I do not think her symmetry is going to be all that affected due to the fact that this breast is already considerably smaller on the left and she wears a prosthesis.  We discussed the risks of operation including bleeding, infection, possible reoperation. She understands her further therapy will be based on what her stages at the time of her operation.

## 2023-10-21 ENCOUNTER — Encounter (HOSPITAL_COMMUNITY): Payer: Self-pay | Admitting: General Surgery

## 2023-10-21 ENCOUNTER — Other Ambulatory Visit: Payer: Self-pay

## 2023-10-21 ENCOUNTER — Ambulatory Visit (HOSPITAL_COMMUNITY): Payer: Medicare Other | Admitting: Physician Assistant

## 2023-10-21 ENCOUNTER — Ambulatory Visit (HOSPITAL_BASED_OUTPATIENT_CLINIC_OR_DEPARTMENT_OTHER): Payer: Medicare Other | Admitting: Anesthesiology

## 2023-10-21 ENCOUNTER — Observation Stay (HOSPITAL_COMMUNITY)
Admission: RE | Admit: 2023-10-21 | Discharge: 2023-10-22 | Disposition: A | Discharge status: Home / Self Care | Payer: Medicare Other | Attending: General Surgery | Admitting: General Surgery

## 2023-10-21 ENCOUNTER — Encounter (HOSPITAL_COMMUNITY)
Admission: RE | Disposition: A | Discharge status: Home / Self Care | Payer: Self-pay | Source: Home / Self Care | Attending: General Surgery

## 2023-10-21 ENCOUNTER — Encounter: Payer: Self-pay | Admitting: *Deleted

## 2023-10-21 DIAGNOSIS — Z79899 Other long term (current) drug therapy: Secondary | ICD-10-CM | POA: Insufficient documentation

## 2023-10-21 DIAGNOSIS — E1122 Type 2 diabetes mellitus with diabetic chronic kidney disease: Secondary | ICD-10-CM | POA: Insufficient documentation

## 2023-10-21 DIAGNOSIS — Z7984 Long term (current) use of oral hypoglycemic drugs: Secondary | ICD-10-CM | POA: Insufficient documentation

## 2023-10-21 DIAGNOSIS — G4733 Obstructive sleep apnea (adult) (pediatric): Secondary | ICD-10-CM

## 2023-10-21 DIAGNOSIS — C50919 Malignant neoplasm of unspecified site of unspecified female breast: Principal | ICD-10-CM | POA: Diagnosis present

## 2023-10-21 DIAGNOSIS — N189 Chronic kidney disease, unspecified: Secondary | ICD-10-CM | POA: Insufficient documentation

## 2023-10-21 DIAGNOSIS — C50912 Malignant neoplasm of unspecified site of left female breast: Secondary | ICD-10-CM

## 2023-10-21 DIAGNOSIS — Z170 Estrogen receptor positive status [ER+]: Secondary | ICD-10-CM | POA: Diagnosis not present

## 2023-10-21 DIAGNOSIS — I129 Hypertensive chronic kidney disease with stage 1 through stage 4 chronic kidney disease, or unspecified chronic kidney disease: Secondary | ICD-10-CM | POA: Insufficient documentation

## 2023-10-21 DIAGNOSIS — Z9012 Acquired absence of left breast and nipple: Secondary | ICD-10-CM

## 2023-10-21 DIAGNOSIS — C50112 Malignant neoplasm of central portion of left female breast: Principal | ICD-10-CM | POA: Insufficient documentation

## 2023-10-21 HISTORY — PX: SIMPLE MASTECTOMY WITH AXILLARY SENTINEL NODE BIOPSY: SHX6098

## 2023-10-21 LAB — GLUCOSE, CAPILLARY
Glucose-Capillary: 125 mg/dL — ABNORMAL HIGH (ref 70–99)
Glucose-Capillary: 155 mg/dL — ABNORMAL HIGH (ref 70–99)
Glucose-Capillary: 207 mg/dL — ABNORMAL HIGH (ref 70–99)

## 2023-10-21 SURGERY — SIMPLE MASTECTOMY
Anesthesia: Regional | Laterality: Left

## 2023-10-21 MED ORDER — ACETAMINOPHEN 650 MG RE SUPP: 650.0000 mg | Freq: Four times a day (QID) | RECTAL | Status: DC | PRN

## 2023-10-21 MED ORDER — ACETAMINOPHEN 500 MG PO TABS
1000.0000 mg | ORAL_TABLET | ORAL | Status: AC
  Administered 2023-10-21: 1000 mg via ORAL
  Filled 2023-10-21: qty 2

## 2023-10-21 MED ORDER — GABAPENTIN 300 MG PO CAPS
600.0000 mg | ORAL_CAPSULE | Freq: Every day | ORAL | Status: DC
  Administered 2023-10-21: 600 mg via ORAL
  Filled 2023-10-21 (×2): qty 2

## 2023-10-21 MED ORDER — MIDAZOLAM HCL 2 MG/2ML IJ SOLN
INTRAMUSCULAR | Status: AC
  Filled 2023-10-21: qty 2

## 2023-10-21 MED ORDER — METHOCARBAMOL 1000 MG/10ML IJ SOLN: 500.0000 mg | Freq: Three times a day (TID) | INTRAMUSCULAR | Status: DC | PRN

## 2023-10-21 MED ORDER — CALCIUM CARBONATE 1250 (500 CA) MG PO TABS
500.0000 mg | ORAL_TABLET | Freq: Two times a day (BID) | ORAL | Status: DC
  Administered 2023-10-21: 1250 mg via ORAL
  Filled 2023-10-21: qty 1

## 2023-10-21 MED ORDER — CEFAZOLIN SODIUM-DEXTROSE 2-4 GM/100ML-% IV SOLN
2.0000 g | INTRAVENOUS | Status: AC
  Administered 2023-10-21: 2 g via INTRAVENOUS
  Filled 2023-10-21: qty 100

## 2023-10-21 MED ORDER — PHENYLEPHRINE HCL-NACL 20-0.9 MG/250ML-% IV SOLN
INTRAVENOUS | Status: DC | PRN
  Administered 2023-10-21: 30 ug/min via INTRAVENOUS

## 2023-10-21 MED ORDER — TRANEXAMIC ACID 1000 MG/10ML IV SOLN
INTRAVENOUS | Status: DC | PRN
  Administered 2023-10-21: 2000 mg via TOPICAL

## 2023-10-21 MED ORDER — BUPIVACAINE LIPOSOME 1.3 % IJ SUSP
INTRAMUSCULAR | Status: AC
  Filled 2023-10-21: qty 10

## 2023-10-21 MED ORDER — PROCHLORPERAZINE MALEATE 10 MG PO TABS: 10.0000 mg | ORAL_TABLET | Freq: Four times a day (QID) | ORAL | Status: DC | PRN

## 2023-10-21 MED ORDER — DEXMEDETOMIDINE HCL IN NACL 80 MCG/20ML IV SOLN
INTRAVENOUS | Status: DC | PRN
  Administered 2023-10-21: 8 ug via INTRAVENOUS

## 2023-10-21 MED ORDER — HYDRALAZINE HCL 25 MG PO TABS
25.0000 mg | ORAL_TABLET | Freq: Three times a day (TID) | ORAL | Status: DC
Start: 2023-10-22 — End: 2023-10-22
  Administered 2023-10-22: 25 mg via ORAL
  Filled 2023-10-21: qty 1

## 2023-10-21 MED ORDER — ONDANSETRON HCL 4 MG/2ML IJ SOLN
INTRAMUSCULAR | Status: DC | PRN
  Administered 2023-10-21: 4 mg via INTRAVENOUS

## 2023-10-21 MED ORDER — ROCURONIUM BROMIDE 10 MG/ML (PF) SYRINGE
PREFILLED_SYRINGE | INTRAVENOUS | Status: DC | PRN
  Administered 2023-10-21: 50 mg via INTRAVENOUS

## 2023-10-21 MED ORDER — SODIUM CHLORIDE 0.9% FLUSH: 10.0000 mL | Freq: Two times a day (BID) | INTRAVENOUS | Status: DC

## 2023-10-21 MED ORDER — FENTANYL CITRATE (PF) 250 MCG/5ML IJ SOLN
INTRAMUSCULAR | Status: AC
  Filled 2023-10-21: qty 5

## 2023-10-21 MED ORDER — ROCURONIUM BROMIDE 10 MG/ML (PF) SYRINGE
PREFILLED_SYRINGE | INTRAVENOUS | Status: AC
  Filled 2023-10-21: qty 10

## 2023-10-21 MED ORDER — PROPOFOL 1000 MG/100ML IV EMUL
INTRAVENOUS | Status: AC
  Filled 2023-10-21: qty 100

## 2023-10-21 MED ORDER — INSULIN ASPART 100 UNIT/ML IJ SOLN
0.0000 [IU] | INTRAMUSCULAR | Status: DC | PRN
Start: 2023-10-21 — End: 2023-10-21

## 2023-10-21 MED ORDER — ONDANSETRON HCL 4 MG/2ML IJ SOLN
INTRAMUSCULAR | Status: AC
  Filled 2023-10-21: qty 2

## 2023-10-21 MED ORDER — HYDROCODONE-ACETAMINOPHEN 5-325 MG PO TABS
1.0000 | ORAL_TABLET | ORAL | Status: DC | PRN
  Administered 2023-10-21 (×2): 1 via ORAL
  Filled 2023-10-21 (×2): qty 1
  Filled 2023-10-21: qty 2

## 2023-10-21 MED ORDER — TRANEXAMIC ACID 1000 MG/10ML IV SOLN
2000.0000 mg | Freq: Once | INTRAVENOUS | Status: DC
  Filled 2023-10-21: qty 20

## 2023-10-21 MED ORDER — ACETAMINOPHEN 325 MG PO TABS: 650.0000 mg | ORAL_TABLET | Freq: Four times a day (QID) | ORAL | Status: DC | PRN

## 2023-10-21 MED ORDER — ENSURE PRE-SURGERY PO LIQD: 296.0000 mL | Freq: Once | ORAL | Status: DC

## 2023-10-21 MED ORDER — CHLORHEXIDINE GLUCONATE CLOTH 2 % EX PADS: 6.0000 | MEDICATED_PAD | Freq: Once | CUTANEOUS | Status: DC

## 2023-10-21 MED ORDER — ORAL CARE MOUTH RINSE: 15.0000 mL | Freq: Once | OROMUCOSAL | Status: AC

## 2023-10-21 MED ORDER — LIDOCAINE 2% (20 MG/ML) 5 ML SYRINGE
INTRAMUSCULAR | Status: AC
  Filled 2023-10-21: qty 5

## 2023-10-21 MED ORDER — METHOCARBAMOL 500 MG PO TABS: 500.0000 mg | ORAL_TABLET | Freq: Three times a day (TID) | ORAL | Status: DC | PRN

## 2023-10-21 MED ORDER — MELATONIN 3 MG PO TABS: 9.0000 mg | ORAL_TABLET | Freq: Every evening | ORAL | Status: DC | PRN

## 2023-10-21 MED ORDER — SIMETHICONE 80 MG PO CHEW: 40.0000 mg | CHEWABLE_TABLET | Freq: Four times a day (QID) | ORAL | Status: DC | PRN

## 2023-10-21 MED ORDER — FAMOTIDINE 20 MG PO TABS
20.0000 mg | ORAL_TABLET | Freq: Two times a day (BID) | ORAL | Status: DC
  Administered 2023-10-21: 20 mg via ORAL
  Filled 2023-10-21: qty 1

## 2023-10-21 MED ORDER — FERROUS SULFATE 325 (65 FE) MG PO TABS
325.0000 mg | ORAL_TABLET | Freq: Two times a day (BID) | ORAL | Status: DC
  Administered 2023-10-21: 325 mg via ORAL
  Filled 2023-10-21: qty 1

## 2023-10-21 MED ORDER — LEVOTHYROXINE SODIUM 75 MCG PO TABS
75.0000 ug | ORAL_TABLET | Freq: Every day | ORAL | Status: DC
  Administered 2023-10-22: 75 ug via ORAL
  Filled 2023-10-21: qty 1

## 2023-10-21 MED ORDER — LACTATED RINGERS IV SOLN
INTRAVENOUS | Status: DC | PRN
Start: 2023-10-21 — End: 2023-10-21

## 2023-10-21 MED ORDER — METOPROLOL SUCCINATE ER 25 MG PO TB24
50.0000 mg | ORAL_TABLET | Freq: Two times a day (BID) | ORAL | Status: DC
  Administered 2023-10-21: 50 mg via ORAL
  Filled 2023-10-21: qty 2

## 2023-10-21 MED ORDER — SUGAMMADEX SODIUM 200 MG/2ML IV SOLN
INTRAVENOUS | Status: DC | PRN
  Administered 2023-10-21: 200 mg via INTRAVENOUS

## 2023-10-21 MED ORDER — LISINOPRIL 20 MG PO TABS
20.0000 mg | ORAL_TABLET | Freq: Every morning | ORAL | Status: DC
  Administered 2023-10-22: 20 mg via ORAL
  Filled 2023-10-21: qty 1

## 2023-10-21 MED ORDER — MIDAZOLAM HCL 2 MG/2ML IJ SOLN
INTRAMUSCULAR | Status: DC | PRN
  Administered 2023-10-21: 1 mg via INTRAVENOUS

## 2023-10-21 MED ORDER — PROCHLORPERAZINE EDISYLATE 10 MG/2ML IJ SOLN: 5.0000 mg | Freq: Four times a day (QID) | INTRAMUSCULAR | Status: DC | PRN

## 2023-10-21 MED ORDER — INSULIN ASPART 100 UNIT/ML IJ SOLN: 0.0000 [IU] | Freq: Three times a day (TID) | INTRAMUSCULAR | Status: DC

## 2023-10-21 MED ORDER — CALCIUM CARBONATE 600 MG PO TABS: 600.0000 mg | ORAL_TABLET | Freq: Two times a day (BID) | ORAL | Status: DC

## 2023-10-21 MED ORDER — DEXAMETHASONE SODIUM PHOSPHATE 10 MG/ML IJ SOLN
INTRAMUSCULAR | Status: AC
  Filled 2023-10-21: qty 1

## 2023-10-21 MED ORDER — DEXMEDETOMIDINE HCL IN NACL 80 MCG/20ML IV SOLN
INTRAVENOUS | Status: AC
  Filled 2023-10-21: qty 20

## 2023-10-21 MED ORDER — DEXAMETHASONE SODIUM PHOSPHATE 10 MG/ML IJ SOLN
INTRAMUSCULAR | Status: DC | PRN
  Administered 2023-10-21: 5 mg via INTRAVENOUS

## 2023-10-21 MED ORDER — ATORVASTATIN CALCIUM 40 MG PO TABS
40.0000 mg | ORAL_TABLET | Freq: Every day | ORAL | Status: DC
  Administered 2023-10-21: 40 mg via ORAL
  Filled 2023-10-21: qty 1

## 2023-10-21 MED ORDER — FENTANYL CITRATE (PF) 100 MCG/2ML IJ SOLN
INTRAMUSCULAR | Status: AC
  Filled 2023-10-21: qty 2

## 2023-10-21 MED ORDER — ISOSORBIDE MONONITRATE ER 30 MG PO TB24
30.0000 mg | ORAL_TABLET | Freq: Every morning | ORAL | Status: DC
  Administered 2023-10-22: 30 mg via ORAL
  Filled 2023-10-21: qty 1

## 2023-10-21 MED ORDER — LIDOCAINE 2% (20 MG/ML) 5 ML SYRINGE
INTRAMUSCULAR | Status: DC | PRN
  Administered 2023-10-21: 80 mg via INTRAVENOUS

## 2023-10-21 MED ORDER — KCL-LACTATED RINGERS-D5W 20 MEQ/L IV SOLN
INTRAVENOUS | Status: DC
  Filled 2023-10-21 (×2): qty 1000

## 2023-10-21 MED ORDER — FENTANYL CITRATE (PF) 250 MCG/5ML IJ SOLN
INTRAMUSCULAR | Status: DC | PRN
  Administered 2023-10-21 (×2): 50 ug via INTRAVENOUS

## 2023-10-21 MED ORDER — PROPOFOL 10 MG/ML IV BOLUS
INTRAVENOUS | Status: AC
  Filled 2023-10-21: qty 20

## 2023-10-21 MED ORDER — CHLORHEXIDINE GLUCONATE 0.12 % MT SOLN
15.0000 mL | Freq: Once | OROMUCOSAL | Status: AC
Start: 2023-10-21 — End: 2023-10-21
  Administered 2023-10-21: 15 mL via OROMUCOSAL
  Filled 2023-10-21: qty 15

## 2023-10-21 MED ORDER — FLUTICASONE PROPIONATE 50 MCG/ACT NA SUSP: 1.0000 | Freq: Every day | NASAL | Status: DC | PRN

## 2023-10-21 MED ORDER — PROPOFOL 10 MG/ML IV BOLUS
INTRAVENOUS | Status: DC | PRN
  Administered 2023-10-21: 150 mg via INTRAVENOUS
  Administered 2023-10-21: 150 ug/kg/min via INTRAVENOUS

## 2023-10-21 SURGICAL SUPPLY — 42 items
ADH SKN CLS APL DERMABOND .7 (GAUZE/BANDAGES/DRESSINGS) ×1
APL PRP STRL LF DISP 70% ISPRP (MISCELLANEOUS) ×1
APPLIER CLIP 9.375 MED OPEN (MISCELLANEOUS) ×1
APR CLP MED 9.3 20 MLT OPN (MISCELLANEOUS) ×1
BAG COUNTER SPONGE SURGICOUNT (BAG) ×1 IMPLANT
BAG SPNG CNTER NS LX DISP (BAG) ×1
BINDER BREAST LRG (GAUZE/BANDAGES/DRESSINGS) IMPLANT
BINDER BREAST XLRG (GAUZE/BANDAGES/DRESSINGS) IMPLANT
BIOPATCH RED 1 DISK 7.0 (GAUZE/BANDAGES/DRESSINGS) ×1 IMPLANT
CHLORAPREP W/TINT 26 (MISCELLANEOUS) ×1 IMPLANT
CLIP APPLIE 9.375 MED OPEN (MISCELLANEOUS) ×1 IMPLANT
COVER SURGICAL LIGHT HANDLE (MISCELLANEOUS) ×1 IMPLANT
DERMABOND ADVANCED .7 DNX12 (GAUZE/BANDAGES/DRESSINGS) ×1 IMPLANT
DRAIN CHANNEL 19F RND (DRAIN) ×1 IMPLANT
DRAPE TOP ARMCOVERS (MISCELLANEOUS) ×1 IMPLANT
DRAPE U-SHAPE 76X120 STRL (DRAPES) ×1 IMPLANT
DRSG TEGADERM 4X4.75 (GAUZE/BANDAGES/DRESSINGS) ×1 IMPLANT
ELECT BLADE 4.0 EZ CLEAN MEGAD (MISCELLANEOUS) ×1
ELECT CAUTERY BLADE 6.4 (BLADE) ×1 IMPLANT
ELECT REM PT RETURN 9FT ADLT (ELECTROSURGICAL) ×1
ELECTRODE BLDE 4.0 EZ CLN MEGD (MISCELLANEOUS) ×1 IMPLANT
ELECTRODE REM PT RTRN 9FT ADLT (ELECTROSURGICAL) ×1 IMPLANT
EVACUATOR SILICONE 100CC (DRAIN) ×1 IMPLANT
GAUZE PAD ABD 8X10 STRL (GAUZE/BANDAGES/DRESSINGS) ×2 IMPLANT
GLOVE BIO SURGEON STRL SZ7 (GLOVE) ×1 IMPLANT
GLOVE BIOGEL PI IND STRL 7.5 (GLOVE) ×1 IMPLANT
GOWN STRL REUS W/ TWL LRG LVL3 (GOWN DISPOSABLE) ×3 IMPLANT
GOWN STRL REUS W/TWL LRG LVL3 (GOWN DISPOSABLE) ×3
KIT BASIN OR (CUSTOM PROCEDURE TRAY) ×1 IMPLANT
KIT TURNOVER KIT B (KITS) ×1 IMPLANT
NS IRRIG 1000ML POUR BTL (IV SOLUTION) ×1 IMPLANT
PACK GENERAL/GYN (CUSTOM PROCEDURE TRAY) ×1 IMPLANT
PAD ARMBOARD 7.5X6 YLW CONV (MISCELLANEOUS) ×1 IMPLANT
PENCIL SMOKE EVACUATOR (MISCELLANEOUS) ×1 IMPLANT
STRIP CLOSURE SKIN 1/2X4 (GAUZE/BANDAGES/DRESSINGS) ×1 IMPLANT
SUT ETHILON 2 0 FS 18 (SUTURE) ×1 IMPLANT
SUT MNCRL AB 4-0 PS2 18 (SUTURE) ×1 IMPLANT
SUT SILK 2 0 SH (SUTURE) ×1 IMPLANT
SUT VIC AB 2-0 SH 18 (SUTURE) ×1 IMPLANT
SUT VIC AB 3-0 SH 18 (SUTURE) ×1 IMPLANT
TOWEL GREEN STERILE (TOWEL DISPOSABLE) ×1 IMPLANT
TOWEL GREEN STERILE FF (TOWEL DISPOSABLE) ×1 IMPLANT

## 2023-10-21 NOTE — Plan of Care (Signed)
  Problem: Elimination: Goal: Will not experience complications related to urinary retention Outcome: Progressing   Problem: Pain Management: Goal: General experience of comfort will improve Outcome: Progressing   Problem: Safety: Goal: Ability to remain free from injury will improve Outcome: Progressing   Problem: Skin Integrity: Goal: Risk for impaired skin integrity will decrease Outcome: Progressing

## 2023-10-21 NOTE — Anesthesia Postprocedure Evaluation (Signed)
Anesthesia Post Note  Patient: Kristin [name]  Procedure(s) Performed: LEFT MASTECTOMY (Left)     Patient location during evaluation: PACU Anesthesia Type: Regional and General Level of consciousness: awake and alert Pain management: pain level controlled Vital Signs Assessment: post-procedure vital signs reviewed and stable Respiratory status: spontaneous breathing, nonlabored ventilation, respiratory function stable and patient connected to nasal cannula oxygen Cardiovascular status: blood pressure returned to baseline and stable Postop Assessment: no apparent nausea or vomiting Anesthetic complications: no   No notable events documented.  Last Vitals:  Vitals:   10/21/23 1000 10/21/23 1026  BP: 137/74 (!) 143/76  Pulse: 69 70  Resp: 14 17  Temp:  (!) 36.4 C  SpO2: 95% 98%    Last Pain:  Vitals:   10/21/23 1026  TempSrc: Oral  PainSc:                  Trevor Iha

## 2023-10-21 NOTE — Progress Notes (Signed)
Pt arrived to 6N27 via stretcher from PACU. Received report from Loistine Chance, Charity fundraiser. See assessment.

## 2023-10-21 NOTE — Progress Notes (Signed)
Left message for Tobie Lords at [employer].

## 2023-10-21 NOTE — Interval H&P Note (Signed)
History and Physical Interval Note:  10/21/2023 7:14 AM  Kristin [name]  has presented today for surgery, with the diagnosis of LEFT BREAST CANCER.  The various methods of treatment have been discussed with the patient and family. After consideration of risks, benefits and other options for treatment, the patient has consented to  Procedure(s) with comments: LEFT MASTECTOMY (Left) - 90 MINUTES LMA PEC BLOCK as a surgical intervention.  The patient's history has been reviewed, patient examined, no change in status, stable for surgery.  I have reviewed the patient's chart and labs.  Questions were answered to the patient's satisfaction.     [name]

## 2023-10-21 NOTE — Discharge Instructions (Signed)
[facility] [phone]  MASTECTOMY: POST OP INSTRUCTIONS Take 400 mg of ibuprofen every 8 hours or 650 mg tylenol every 6 hours for next 72 hours then as needed. Use ice several times daily also. Always review your discharge instruction sheet given to you by the facility where your surgery was performed.  A prescription for pain medication may be given to you upon discharge.  Take your pain medication as prescribed, if needed.  If narcotic pain medicine is not needed, then you may take acetaminophen (Tylenol), naprosyn (Alleve) or ibuprofen (Advil) as needed. Take your usually prescribed medications unless otherwise directed. If you need a refill on your pain medication, please contact your pharmacy.  They will contact our office to request authorization.  Prescriptions will not be filled after 5pm or on week-ends. You should follow a light diet the first 24 hours after surgery.  Resume your normal diet the day after surgery. Most patients will experience some swelling and bruising on the chest and underarm.  Ice packs will help.  Swelling and bruising can take several days to resolve. Wear the binder or [place] bra for 72 hours day and night. After night please wear during the day.  It is common to experience some constipation if taking pain medication after surgery.  Increasing fluid intake and taking a stool softener (such as Colace) will usually help or prevent this problem from occurring.  A mild laxative (Milk of Magnesia or Miralax) should be taken according to package instructions if there are no bowel movements after 48 hours. There is glue and steristrips on your incision. They will come off in the next few weeks.  You may take a shower 48 hours after surgery.  Any sutures will be removed at an office visit DRAINS:  If you have drains in place, it is important to keep a list of the amount of drainage produced each day in your drains.  Before leaving the hospital, you  should be instructed on drain care.  Call our office if you have any questions about your drains. I will remove your drains when they put out less than 30 cc or ml for 2 consecutive days. ACTIVITIES:  You may resume regular (light) daily activities beginning the next day--such as daily self-care, walking, climbing stairs--gradually increasing activities as tolerated.  You may have sexual intercourse when it is comfortable.  Refrain from any heavy lifting or straining until approved by your doctor. You may drive when you are no longer taking prescription pain medication, you can comfortably wear a seatbelt, and you can safely maneuver your car and apply brakes. RETURN TO WORK:  __________________________________________________________ You should see your doctor in the office for a follow-up appointment approximately 3-5 days after your surgery.  Your doctor's nurse will typically make your follow-up appointment when she calls you with your pathology report.  Expect your pathology report 3-4business days after surgery. OTHER INSTRUCTIONS: ______________________________________________________________________________________________ ____________________________________________________________________________________________ WHEN TO CALL YOUR DR Milanni Ayub: Fever over 101.0 Nausea and/or vomiting Extreme swelling or bruising Continued bleeding from incision. Increased pain, redness, or drainage from the incision. The clinic staff is available to answer your questions during regular business hours.  Please don't hesitate to call and ask to speak to one of the nurses for clinical concerns.  If you have a medical emergency, go to the nearest emergency room or call 911.  A surgeon from [facility] is always on call at the hospital. [address], [address], [place], [place]    [address] ? [address] [address], [place], [place]   [address] [phone] ? [phone] ? FAX [phone] Web site:  [email or web address]    About my Jackson-Pratt Bulb Drain  What is a Jackson-Pratt bulb? A Jackson-Pratt is a soft, round device used to collect drainage. It is connected to a long, thin drainage catheter, which is held in place by one or two small stiches near your surgical incision site. When the bulb is squeezed, it forms a vacuum, forcing the drainage to empty into the bulb.  Emptying the Jackson-Pratt bulb- To empty the bulb: 1. Release the plug on the top of the bulb. 2. Pour the bulb's contents into a measuring container which your nurse will provide. 3. Record the time emptied and amount of drainage. Empty the drain(s) as often as your     doctor or nurse recommends.  Date                  Time                    Amount (Drain 1)                 Amount (Drain 2)  _____________________________________________________________________  _____________________________________________________________________  _____________________________________________________________________  _____________________________________________________________________  _____________________________________________________________________  _____________________________________________________________________  _____________________________________________________________________  _____________________________________________________________________  Squeezing the Jackson-Pratt Bulb- To squeeze the bulb: 1. Make sure the plug at the top of the bulb is open. 2. Squeeze the bulb tightly in your fist. You will hear air squeezing from the bulb. 3. Replace the plug while the bulb is squeezed. 4. Use a safety pin to attach the bulb to your clothing. This will keep the catheter from     pulling at the bulb insertion site.  When to call your doctor- Call your doctor if: Drain site becomes red, swollen or hot. You have a fever greater than 101 degrees F. There is oozing at the drain  site. Drain falls out (apply a guaze bandage over the drain hole and secure it with tape). Drainage increases daily not related to activity patterns. (You will usually have more drainage when you are active than when you are resting.) Drainage has a bad odor.   

## 2023-10-21 NOTE — Anesthesia Procedure Notes (Signed)
Anesthesia Regional Block: Pectoralis block   Pre-Anesthetic Checklist: , timeout performed,  Correct Patient, Correct Site, Correct Laterality,  Correct Procedure, Correct Position, site marked,  Risks and benefits discussed,  Surgical consent,  Pre-op evaluation,  At surgeon's request and post-op pain management  Laterality: Left and Upper  Prep: chloraprep       Needles:  Injection technique: Single-shot  Needle Type: Echogenic Needle     Needle Length: 9cm  Needle Gauge: 21     Additional Needles:   Procedures:,,,, ultrasound used (permanent image in chart),,    Narrative:  Start time: 10/21/2023 6:55 AM End time: 10/21/2023 7:05 AM Injection made incrementally with aspirations every 5 mL.  Performed by: Personally  Anesthesiologist: Trevor Iha, MD  Additional Notes: Block assessed. Patient tolerated procedure well.

## 2023-10-21 NOTE — Op Note (Signed)
Preoperative diagnosis left breast cancer status post prior lumpectomy and radiation therapy Postoperative diagnosis: Same as above Procedure: Left mastectomy Surgeon: Dr. Harden Mo Anesthesia: General With a pectoral block Estimated blood loss: Minimal Specimens: Left breast tissue marked short superior, long lateral Complications: None Drains: 19 Jamaica Blake drain placed to space Special count was correct completion Disposition recovery stable condition  Indications: This is 77 year old female who has a prior history of breast cancer at age 72.  She went on a lumpectomy and axillary lymph node dissection at that time.  This was followed by radiotherapy and chemotherapy.  In her recent mammogram she had a distortion and a possible mass with calcifications in the left breast.  The right was negative.  Mammogram showed a 1.5 cm and a 9 mm mass as well as 7 cm of calcifications.  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  She has been seen by oncology.  We discussed proceeding with a mastectomy.  Procedure: After informed consent was obtained she first underwent a pectoral block.  Antibiotics were given.  SCDs were placed.  She was then placed under general anesthesia without complication.  She was prepped and draped in a sterile sterile surgical fashion.  A surgical timeout was then performed.  I made a large elliptical incision with the bottom limb being in the inframammary fold.  I then created flaps to obliterate the inframammary fold, to the parasternal area, to the clavicle as well as the latissimus laterally.  I then remove the breast tissue and the pectoralis fascia from the muscle.  This was somewhat difficult given her prior surgery as well as radiation.  This was then passed off the table.  Hemostasis was obtained.  I placed a TXA soaked sponge in the cavity for 5 minutes.  When I looked  this was hemostatic.  I placed a 52 [place] Blake drain and secured this with a 2-0 nylon suture.  I then closed the incision with 3-0 Vicryl dermal sutures.  The lateral portion I was concerned had some extra tissue but I brought her arm down by the side and it did not really feel like removing more was going to help.  This was fairly flat.  There is only tissue left really is her back.  I then completed the closure of the dermal layer with 3-0 Vicryl sutures.  I then closed the skin with 4-0 Monocryl.  Glue and Steri-Strips were applied.  A binder was placed.  She tolerated this well was extubated and transferred recovery stable.

## 2023-10-21 NOTE — Progress Notes (Signed)
Pt called out to nurse's station saying "my drain came off". RN to bedside. JP bulb detached from tubing. RN reattached JP bulb to tubing. JP drain in working order and suctioning at this time. On call Phylliss Blakes, MD paged. No new orders at this time.

## 2023-10-21 NOTE — Anesthesia Procedure Notes (Signed)
Procedure Name: Intubation Date/Time: 10/21/2023 7:38 AM  Performed by: Eulah Pont, CRNAPre-anesthesia Checklist: Patient identified, Emergency Drugs available, Suction available and Patient being monitored Patient Re-evaluated:Patient Re-evaluated prior to induction Oxygen Delivery Method: Circle System Utilized Preoxygenation: Pre-oxygenation with 100% oxygen Induction Type: IV induction Ventilation: Mask ventilation without difficulty Grade View: Grade II Tube type: Oral Tube size: 7.0 mm Number of attempts: 1 Airway Equipment and Method: Stylet Placement Confirmation: ETT inserted through vocal cords under direct vision, positive ETCO2 and breath sounds checked- equal and bilateral Secured at: 21 cm Tube secured with: Tape Dental Injury: Teeth and Oropharynx as per pre-operative assessment

## 2023-10-21 NOTE — Transfer of Care (Signed)
Immediate Anesthesia Transfer of Care Note  Patient: Kristin [name]  Procedure(s) Performed: LEFT MASTECTOMY (Left)  Patient Location: PACU  Anesthesia Type:GA combined with regional for post-op pain  Level of Consciousness: awake, drowsy, and patient cooperative  Airway & Oxygen Therapy: Patient Spontanous Breathing and Patient connected to face mask oxygen  Post-op Assessment: Report given to RN, Post -op Vital signs reviewed and stable, and Patient moving all extremities X 4  Post vital signs: Reviewed and stable  Last Vitals:  Vitals Value Taken Time  BP 135/72 10/21/23 0855  Temp    Pulse 71 10/21/23 0858  Resp 10 10/21/23 0858  SpO2 98 % 10/21/23 0858  Vitals shown include unfiled device data.  Last Pain:  Vitals:   10/21/23 5784  TempSrc: Oral  PainSc:          Complications: No notable events documented.

## 2023-10-21 NOTE — Progress Notes (Signed)
Patient had her left mastectomy today. Will follow for path.   Oncology Nurse Navigator Documentation     10/21/2023   11:45 AM  Oncology Nurse Navigator Flowsheets  Phase of Treatment Surgery  Surgery Actual Start Date: 10/21/2023  Navigator Follow Up Date: 10/24/2023  Navigator Follow Up Reason: Pathology  Navigator Location [facility]  Navigator Encounter Type Appt/Treatment Plan Review  Treatment Initiated Date 10/21/2023  Patient Visit Type MedOnc  Treatment Phase Pre-Tx/Tx Discussion  Barriers/Navigation Needs Coordination of Care;Education  Interventions None Required  Acuity Level 2-Minimal Needs (1-2 Barriers Identified)  Support Groups/Services Friends and Family  Time Spent with Patient 15

## 2023-10-22 ENCOUNTER — Encounter (HOSPITAL_COMMUNITY): Payer: Self-pay | Admitting: General Surgery

## 2023-10-22 ENCOUNTER — Other Ambulatory Visit (HOSPITAL_COMMUNITY): Payer: Self-pay

## 2023-10-22 DIAGNOSIS — C50112 Malignant neoplasm of central portion of left female breast: Secondary | ICD-10-CM | POA: Diagnosis not present

## 2023-10-22 LAB — GLUCOSE, CAPILLARY: Glucose-Capillary: 213 mg/dL — ABNORMAL HIGH (ref 70–99)

## 2023-10-22 LAB — SURGICAL PATHOLOGY

## 2023-10-22 MED ORDER — HYDROCODONE-ACETAMINOPHEN 5-325 MG PO TABS
1.0000 | ORAL_TABLET | ORAL | 0 refills | Status: DC | PRN
  Filled 2023-10-22: qty 10, 2d supply, fill #0

## 2023-10-22 NOTE — Discharge Summary (Signed)
Physician Discharge Summary  Patient ID: Kristin [name] MRN: [id number] DOB/AGE: [birth year] 77 y.o.  Admit date: 10/21/2023 Discharge date: 10/22/2023  Admission Diagnoses: HTN CKD DM Breast cancer  Discharge Diagnoses:  Principal Problem:   Breast cancer (HCC) Active Problems:   S/P left mastectomy   Discharged Condition: good  Hospital Course: 77 yof with left breast cancer after prior lump/alnd in remote past.  She underwent mastectomy and has done well. No issues following morning. Ready for dc  Consults: None  Significant Diagnostic Studies: none  Treatments: surgery: left mastectomy  Discharge Exam: Blood pressure (!) 165/89, pulse 70, temperature 97.9 F (36.6 C), temperature source Oral, resp. rate 18, height 5' 4.5" (1.638 m), weight 105.7 kg, SpO2 98%. No hematoma left chest, drain as expected  Disposition: Discharge disposition: 01-Home or Self Care        Allergies as of 10/22/2023       Reactions   Ibuprofen Other (See Comments)   Does not take due to kidney function    Levodopa Other (See Comments)   Increase of dopamine levels   Oxycodone Other (See Comments)   Pt cannot tolerate oxycodone- makes her "loopy"/ AMS        Medication List     TAKE these medications    aspirin EC 81 MG tablet Take 81 mg by mouth every other day. In the morning Swallow whole.   atorvastatin 40 MG tablet Commonly known as: LIPITOR Take 40 mg by mouth at bedtime.   Calcium 600 600 MG Tabs tablet Generic drug: calcium carbonate Take 600 mg by mouth 2 (two) times daily with a meal.   colestipol 1 g tablet Commonly known as: COLESTID Take 1 g by mouth daily as needed (diarrhea).   cyanocobalamin 1000 MCG tablet Commonly known as: VITAMIN B12 Take 1,000 mcg by mouth in the morning.   famotidine 20 MG tablet Commonly known as: PEPCID Take 20 mg by mouth 2 (two) times daily.   ferrous sulfate 325 (65 FE) MG tablet Take 325 mg by mouth in the  morning and at bedtime.   Fish Oil 1000 MG Caps Take 1,000 mg by mouth at bedtime.   fluticasone 50 MCG/ACT nasal spray Commonly known as: FLONASE Place 1 spray into both nostrils daily as needed for allergies or rhinitis.   gabapentin 300 MG capsule Commonly known as: NEURONTIN Take 600 mg by mouth at bedtime.   hydrALAZINE 25 MG tablet Commonly known as: APRESOLINE Take 75 mg by mouth in the morning and at bedtime.   HYDROcodone-acetaminophen 5-325 MG tablet Commonly known as: NORCO/VICODIN Take 1 tablet by mouth every 4 (four) hours as needed for moderate pain (pain score 4-6).   isosorbide mononitrate 30 MG 24 hr tablet Commonly known as: IMDUR Take 30 mg by mouth in the morning.   Jardiance 10 MG Tabs tablet Generic drug: empagliflozin Take 10 mg by mouth in the morning.   Lasix 20 MG tablet Generic drug: furosemide Take 10 mg by mouth daily as needed for edema.   letrozole 2.5 MG tablet Commonly known as: FEMARA Take 1 tablet (2.5 mg total) by mouth daily.   lisinopril 20 MG tablet Commonly known as: ZESTRIL Take 20 mg by mouth in the morning.   MAGNESIUM GLYCINATE PO Take 400 mg by mouth in the morning.   Melatonin 10 MG Tabs Take 10 mg by mouth at bedtime as needed (sleep).   metoprolol succinate 50 MG 24 hr tablet Commonly known as: TOPROL-XL Take  50 mg by mouth 2 (two) times daily.   multivitamin with minerals Tabs tablet Take 1 tablet by mouth in the morning.   niacin 500 MG tablet Commonly known as: (VITAMIN B3) Take 500 mg by mouth in the morning and at bedtime.   Synthroid 75 MCG tablet Generic drug: levothyroxine Take 75 mcg by mouth daily before breakfast.   Systane 0.4-0.3 % Soln Generic drug: Polyethyl Glycol-Propyl Glycol Place 1 drop into both eyes 3 (three) times daily as needed (dry/irritated eyes.).   Trulicity 4.5 MG/0.5ML Soaj Generic drug: Dulaglutide Inject 4.5 mg into the skin every Monday.   Vitamin D-3 125 MCG (5000  UT) Tabs Take 5,000 Units by mouth in the morning.   VITAMIN K2-VITAMIN D3 PO Take 1 tablet by mouth in the morning.   zolpidem 12.5 MG CR tablet Commonly known as: AMBIEN CR Take 12.5 mg by mouth at bedtime as needed for sleep.        Follow-up Information     [name], MD Follow up in 3 week(s).   Specialty: General Surgery Contact information: [address] [place] [address] [phone]                 Signed: [name] 10/22/2023, 6:44 AM

## 2023-10-24 ENCOUNTER — Encounter: Payer: Self-pay | Admitting: *Deleted

## 2023-10-24 NOTE — Progress Notes (Signed)
Reviewed path with Dr Myna Hidalgo. Per Dr Myna Hidalgo, request for Oncotype Recurrence Score sent on specimen [phone] DOS 10/21/2023.  Dr Myna Hidalgo would like patient to be seen in about 3-4 week for follow up. Message sent to scheduling.   Oncology Nurse Navigator Documentation     10/24/2023    8:30 AM  Oncology Nurse Navigator Flowsheets  Navigator Follow Up Date: 11/19/2023  Navigator Follow Up Reason: Follow-up Appointment  Navigator Location [facility]  Navigator Encounter Type Molecular Studies  Patient Visit Type MedOnc  Treatment Phase Active Tx  Barriers/Navigation Needs Coordination of Care;Education  Interventions Coordination of Care  Acuity Level 2-Minimal Needs (1-2 Barriers Identified)  Coordination of Care Appts;Pathology  Support Groups/Services Friends and Family  Time Spent with Patient 30

## 2023-10-28 ENCOUNTER — Telehealth: Payer: Self-pay | Admitting: *Deleted

## 2023-10-28 NOTE — Telephone Encounter (Signed)
Per 09/12/23 LOS - We will plan for follow-up depending on when she will have surgery.

## 2023-11-10 ENCOUNTER — Encounter (HOSPITAL_COMMUNITY): Payer: Self-pay

## 2023-11-12 ENCOUNTER — Ambulatory Visit: Payer: Medicare Other | Admitting: Orthopedic Surgery

## 2023-11-12 ENCOUNTER — Ambulatory Visit (INDEPENDENT_AMBULATORY_CARE_PROVIDER_SITE_OTHER): Payer: Medicare Other | Admitting: Surgical

## 2023-11-12 ENCOUNTER — Encounter: Payer: Self-pay | Admitting: Surgical

## 2023-11-12 DIAGNOSIS — Z96612 Presence of left artificial shoulder joint: Secondary | ICD-10-CM

## 2023-11-12 NOTE — Progress Notes (Signed)
Post-Op Visit Note   Patient: Kristin [name]           Date of Birth: Jul 20, 1946           MRN: [id number] Visit Date: 11/12/2023 PCP: System, Provider Not In   Assessment & Plan:  Chief Complaint:  Chief Complaint  Patient presents with   Left Shoulder - Follow-up    05/27/23 left RSA with BT   Visit Diagnoses:  1. History of arthroplasty of left shoulder     Plan: Patient is a 77 year old female who presents s/p left reverse shoulder arthroplasty on 05/27/2023.  She states that she is doing well.  She has been going to physical therapy 3 times per week up until the last 3 weeks.  She feels that her range of motion is good and continually improving.  She does not feel like she has plateaued yet about 5 months out from surgery.  She is working on doing more lifting now such as lifting the plate up into her cabinet.  She has been able to lift a Tide bottle from her top shelf over her head.  She also been able to lay on her left shoulder more in the last few days without much pain.  She is not taking any medication for pain.  She is able to do her hair and feels very functional with her arm.  On exam, patient has 70 degrees X rotation, 80 degrees abduction, 150 degrees forward elevation passively and actively.  She has axillary nerve that is intact with deltoid firing.  Intact EPL, FPL, finger abduction, pronation supination, bicep, tricep.  No reproduction of pain with any of these strength test.  Incision is well-healed.  She unfortunately had to have a left mastectomy about 3 weeks ago which has interrupted her physical therapy.  She is seeing Dr. Dwain Sarna tomorrow to discuss further occupational therapy following this procedure.  From left shoulder standpoint, should be good to continue with her current exercise regimen whenever allowed by Dr. Dwain Sarna and follow-up with our office as needed if she has any new concerns or problems.  Overall she is recovering in an excellent manner  about 5 months out from reverse shoulder replacement and she will return as needed.  Did discuss dental antibiotic prophylaxis and maximal lifting restriction of about 25 pounds lifetime with this arm.  Follow-Up Instructions: No follow-ups on file.   Orders:  No orders of the defined types were placed in this encounter.  No orders of the defined types were placed in this encounter.   Imaging: No results found.  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   AKI (acute kidney injury) (HCC) 08/25/2022   Diarrhea 08/25/2022   Choledocholithiasis 08/25/2022   Nausea and vomiting 08/25/2022   HTN (hypertension) 08/25/2022   HLD (hyperlipidemia) 08/25/2022   Hypothyroidism 08/25/2022   GERD (gastroesophageal reflux disease) 08/25/2022   OSA (obstructive sleep apnea) 08/25/2022   Hemorrhoid 08/25/2022   Unilateral primary osteoarthritis, right knee 01/14/2022   Anemia 04/26/2020   RBBB 04/26/2020   Diabetes (HCC) 04/26/2020   QT prolongation 04/26/2020   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/2017   Closed nondisplaced fracture of head of right radius with routine healing 07/10/2017  Chronic pain of left knee 07/10/2017   Acute pain of left knee 07/10/2017   Unilateral primary osteoarthritis, left knee 07/10/2017   Closed nondisplaced fracture of head of right radius 06/09/2017   Past Medical History:  Diagnosis Date   Anemia 1992   Arthritis    oa   Breast CA (HCC) 1992   lumpectomy with chemo and radiation left breast   Chronic kidney disease stage 3    watching creatining levels sees dr nwbu   Diabetes mellitus without complication (HCC)    type 2    GERD (gastroesophageal reflux disease)    Hiatal  hernia    High cholesterol    Hypertension    Hypothyroidism    Personal history of radiation therapy    Presence of permanent cardiac pacemaker    [employer]tronic   Sleep apnea     Family History  Problem Relation Age of Onset   Breast cancer Mother 69       metastatic   Cancer Maternal Uncle 59 - 49       unknown type   Breast cancer Cousin 27 - 28       maternal first cousin   Breast cancer Cousin 76       maternal first cousin    Past Surgical History:  Procedure Laterality Date   BACK SURGERY     upper and lower upper back has clamp   BICEPT TENODESIS Left 05/27/2023   Procedure: BICEPS TENODESIS;  Surgeon: Cammy Copa, MD;  Location: [facility] OR;  Service: Orthopedics;  Laterality: Left;   BREAST LUMPECTOMY Left    and lymph nodes removed (16-18)   CARDIAC CATHETERIZATION  01/29/2022   CATARACT EXTRACTION Bilateral    CHOLECYSTECTOMY     COLONOSCOPY  10/2022   REVERSE SHOULDER ARTHROPLASTY Left 05/27/2023   Procedure: LEFT REVERSE SHOULDER ARTHROPLASTY;  Surgeon: Cammy Copa, MD;  Location: [facility] OR;  Service: Orthopedics;  Laterality: Left;   SIMPLE MASTECTOMY WITH AXILLARY SENTINEL NODE BIOPSY Left 10/21/2023   Procedure: LEFT MASTECTOMY;  Surgeon: [name], MD;  Location: [facility] OR;  Service: General;  Laterality: Left;  90 MINUTES LMA PEC BLOCK   TONSILLECTOMY     TOTAL KNEE ARTHROPLASTY Left 09/12/2017   Procedure: LEFT TOTAL KNEE ARTHROPLASTY;  Surgeon: Kathryne Hitch, MD;  Location: [facility] ORS;  Service: Orthopedics;  Laterality: Left;   TUBAL LIGATION     Social History   Occupational History   Not on file  Tobacco Use   Smoking status: Never   Smokeless tobacco: Never  Vaping Use   Vaping status: Never Used  Substance and Sexual Activity   Alcohol use: Not Currently    Comment: maybe 1 glass of wine per year   Drug use: No   Sexual activity: Yes    Birth control/protection: Post-menopausal

## 2023-11-14 ENCOUNTER — Other Ambulatory Visit: Payer: Self-pay

## 2023-11-14 DIAGNOSIS — C50011 Malignant neoplasm of nipple and areola, right female breast: Secondary | ICD-10-CM

## 2023-11-17 ENCOUNTER — Inpatient Hospital Stay (HOSPITAL_BASED_OUTPATIENT_CLINIC_OR_DEPARTMENT_OTHER): Payer: Medicare Other | Admitting: Hematology & Oncology

## 2023-11-17 ENCOUNTER — Other Ambulatory Visit: Payer: Self-pay | Admitting: *Deleted

## 2023-11-17 ENCOUNTER — Other Ambulatory Visit: Payer: Self-pay

## 2023-11-17 ENCOUNTER — Encounter: Payer: Self-pay | Admitting: Hematology & Oncology

## 2023-11-17 ENCOUNTER — Inpatient Hospital Stay: Payer: Medicare Other | Attending: Hematology & Oncology

## 2023-11-17 DIAGNOSIS — Z9221 Personal history of antineoplastic chemotherapy: Secondary | ICD-10-CM | POA: Insufficient documentation

## 2023-11-17 DIAGNOSIS — Z79899 Other long term (current) drug therapy: Secondary | ICD-10-CM | POA: Diagnosis not present

## 2023-11-17 DIAGNOSIS — Z923 Personal history of irradiation: Secondary | ICD-10-CM | POA: Insufficient documentation

## 2023-11-17 DIAGNOSIS — C50911 Malignant neoplasm of unspecified site of right female breast: Secondary | ICD-10-CM | POA: Insufficient documentation

## 2023-11-17 DIAGNOSIS — C50011 Malignant neoplasm of nipple and areola, right female breast: Secondary | ICD-10-CM | POA: Diagnosis not present

## 2023-11-17 DIAGNOSIS — Z853 Personal history of malignant neoplasm of breast: Secondary | ICD-10-CM | POA: Insufficient documentation

## 2023-11-17 DIAGNOSIS — Z170 Estrogen receptor positive status [ER+]: Secondary | ICD-10-CM | POA: Insufficient documentation

## 2023-11-17 LAB — CBC WITH DIFFERENTIAL (CANCER CENTER ONLY)
Abs Immature Granulocytes: 0.05 10*3/uL (ref 0.00–0.07)
Basophils Absolute: 0 10*3/uL (ref 0.0–0.1)
Basophils Relative: 0 %
Eosinophils Absolute: 0.1 10*3/uL (ref 0.0–0.5)
Eosinophils Relative: 1 %
HCT: 34.2 % — ABNORMAL LOW (ref 36.0–46.0)
Hemoglobin: 11.6 g/dL — ABNORMAL LOW (ref 12.0–15.0)
Immature Granulocytes: 1 %
Lymphocytes Relative: 15 %
Lymphs Abs: 1.3 10*3/uL (ref 0.7–4.0)
MCH: 34 pg (ref 26.0–34.0)
MCHC: 33.9 g/dL (ref 30.0–36.0)
MCV: 100.3 fL — ABNORMAL HIGH (ref 80.0–100.0)
Monocytes Absolute: 0.5 10*3/uL (ref 0.1–1.0)
Monocytes Relative: 6 %
Neutro Abs: 6.5 10*3/uL (ref 1.7–7.7)
Neutrophils Relative %: 77 %
Platelet Count: 277 10*3/uL (ref 150–400)
RBC: 3.41 MIL/uL — ABNORMAL LOW (ref 3.87–5.11)
RDW: 13.6 % (ref 11.5–15.5)
WBC Count: 8.5 10*3/uL (ref 4.0–10.5)
nRBC: 0 % (ref 0.0–0.2)

## 2023-11-17 LAB — CMP (CANCER CENTER ONLY)
ALT: 13 U/L (ref 0–44)
AST: 18 U/L (ref 15–41)
Albumin: 4 g/dL (ref 3.5–5.0)
Alkaline Phosphatase: 84 U/L (ref 38–126)
Anion gap: 12 (ref 5–15)
BUN: 45 mg/dL — ABNORMAL HIGH (ref 8–23)
CO2: 23 mmol/L (ref 22–32)
Calcium: 11.2 mg/dL — ABNORMAL HIGH (ref 8.9–10.3)
Chloride: 104 mmol/L (ref 98–111)
Creatinine: 1.85 mg/dL — ABNORMAL HIGH (ref 0.44–1.00)
GFR, Estimated: 28 mL/min — ABNORMAL LOW (ref >60)
Glucose, Bld: 141 mg/dL — ABNORMAL HIGH (ref 70–99)
Potassium: 4.1 mmol/L (ref 3.5–5.1)
Sodium: 139 mmol/L (ref 135–145)
Total Bilirubin: 0.7 mg/dL (ref <1.2)
Total Protein: 6.7 g/dL (ref 6.5–8.1)

## 2023-11-17 LAB — MAGNESIUM: Magnesium: 1.6 mg/dL — ABNORMAL LOW (ref 1.7–2.4)

## 2023-11-17 LAB — LACTATE DEHYDROGENASE: LDH: 157 U/L (ref 98–192)

## 2023-11-17 MED ORDER — CYANOCOBALAMIN 1000 MCG/ML IJ SOLN: 1000.0000 ug | Freq: Once | INTRAMUSCULAR | Status: DC

## 2023-11-17 NOTE — Progress Notes (Signed)
START ON PATHWAY REGIMEN - Breast     A cycle is every 21 days:     Cyclophosphamide      Docetaxel   **Always confirm dose/schedule in your pharmacy ordering system**  Patient Characteristics: Postoperative without Neoadjuvant Therapy, M0 (Pathologic Staging), Invasive Disease, Adjuvant Therapy, HER2 Negative, ER Positive, Node Negative, pT1a, pN82mi or pT1b-c, pN0/N72mi or pT2 or Higher, pN0, Oncotype High Risk (? 26) Therapeutic Status: Postoperative without Neoadjuvant Therapy, M0 (Pathologic Staging) AJCC Grade: GX AJCC N Category: pNX AJCC M Category: cM0 ER Status: Positive (+) AJCC 8 Stage Grouping: IB HER2 Status: Negative (-) Oncotype Dx Recurrence Score: Ordered Other Genomic Test AJCC T Category: pTX PR Status: Positive (+) Has this patient completed genomic testing<= Yes - Oncotype DX(R) Intent of Therapy: Curative Intent, Discussed with Patient

## 2023-11-17 NOTE — Progress Notes (Signed)
Hematology and Oncology Follow Up Visit  Kristin [name] [id number] October 14, 1946 77 y.o. 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  Unfortunately, we do have a diagnosis of breast cancer.  She had a mastectomy on 10/21/2023.  The pathology report ([id number]) showed that she had a 2 cm grade 2 invasive ductal carcinoma.  All margins are negative.  There is no lymphovascular invasion.  She had ER positive/PR positive/HER2 negative.  The Ki-67 was 18%.  Surprisingly, the Oncotype score was 39.  As such, I think that she would be a candidate for adjuvant chemotherapy.  Of note, she has had breast cancer in the past.  She had a right breast cancer I think back in 1992.  She had adjuvant chemotherapy with CAF x 6 cycles followed by radiation.  She did have some cardiac damage from this.  She is worried about taking chemotherapy.  I reassured her that the Taxotere/Cytoxan should not affect her cardiac status or her kidney status.  I think 4 cycles would be adequate since she does not have any positive lymph nodes.  She will need to have a Port-A-Cath.  I will see if Dr. Dwain Sarna of Surgical Oncology can do this for [place].  She is busy trying to get her husband.  He is undergoing therapy for myelodysplasia.  He had a very hard time with the first cycle of treatment..  She is also undergoing recovery from a left shoulder surgery.  Again she seems to be doing better with this.  She has had no fever.  She has had no nausea or vomiting.  She has had no cough or shortness of breath.  She has had no swelling of the extremities.  Overall, I would have to say that her performance status is probably ECOG 1.  Medications:  Current Outpatient  Medications:    ALPRAZolam (XANAX) 0.25 MG tablet, Take 0.25 mg by mouth as needed for anxiety., Disp: , Rfl:    aspirin EC 81 MG tablet, Take 81 mg by mouth every other day. In the morning Swallow whole., Disp: , Rfl:    atorvastatin (LIPITOR) 40 MG tablet, Take 40 mg by mouth at bedtime., Disp: , Rfl:    calcium carbonate (CALCIUM 600) 600 MG TABS tablet, Take 600 mg by mouth 2 (two) times daily with a meal., Disp: , Rfl:    Cholecalciferol (VITAMIN D-3) 125 MCG (5000 UT) TABS, Take 5,000 Units by mouth in the morning., Disp: , Rfl:    colestipol (COLESTID) 1 g tablet, Take 1 g by mouth daily as needed (diarrhea)., Disp: , Rfl:    cyanocobalamin (VITAMIN B12) 1000 MCG tablet, Take 1,000 mcg by mouth in the morning., Disp: , Rfl:    famotidine (PEPCID) 20 MG tablet, Take 20 mg by mouth 2 (two) times daily., Disp: , Rfl:    ferrous sulfate 325 (65 FE) MG tablet, Take 325 mg by mouth in the morning and at bedtime., Disp: , Rfl:    fluticasone (FLONASE) 50 MCG/ACT nasal spray, Place 1 spray into both nostrils daily as needed for allergies or rhinitis., Disp: , Rfl:    furosemide (LASIX) 20 MG tablet, Take 10 mg by mouth daily as needed for edema., Disp: , Rfl:  gabapentin (NEURONTIN) 300 MG capsule, Take 600 mg by mouth at bedtime., Disp: , Rfl:    hydrALAZINE (APRESOLINE) 25 MG tablet, Take 75 mg by mouth in the morning and at bedtime., Disp: , Rfl:    isosorbide mononitrate (IMDUR) 30 MG 24 hr tablet, Take 30 mg by mouth in the morning., Disp: , Rfl:    JARDIANCE 10 MG TABS tablet, Take 10 mg by mouth in the morning., Disp: , Rfl:    letrozole (FEMARA) 2.5 MG tablet, Take 1 tablet (2.5 mg total) by mouth daily., Disp: 30 tablet, Rfl: 12   lisinopril (ZESTRIL) 20 MG tablet, Take 20 mg by mouth in the morning., Disp: , Rfl:    MAGNESIUM GLYCINATE PO, Take 300 mg by mouth in the morning and at bedtime., Disp: , Rfl:    Melatonin 10 MG TABS, Take 10 mg by mouth at bedtime as needed (sleep)., Disp: ,  Rfl:    metoprolol succinate (TOPROL-XL) 50 MG 24 hr tablet, Take 50 mg by mouth 2 (two) times daily., Disp: , Rfl:    Multiple Vitamin (MULTIVITAMIN WITH MINERALS) TABS tablet, Take 1 tablet by mouth in the morning., Disp: , Rfl:    niacin (SLO-NIACIN) 500 MG tablet, Take 500 mg by mouth in the morning and at bedtime., Disp: , Rfl:    Omega-3 Fatty Acids (FISH OIL) 1000 MG CAPS, Take 1,000 mg by mouth at bedtime., Disp: , Rfl:    Polyethyl Glycol-Propyl Glycol (SYSTANE) 0.4-0.3 % SOLN, Place 1 drop into both eyes 3 (three) times daily as needed (dry/irritated eyes.)., Disp: , Rfl:    SYNTHROID 75 MCG tablet, Take 75 mcg by mouth daily before breakfast., Disp: , Rfl:    TRULICITY 4.5 MG/0.5ML SOPN, Inject 4.5 mg into the skin every Monday., Disp: , Rfl:    Vitamin D-Vitamin K (VITAMIN K2-VITAMIN D3 PO), Take 1 tablet by mouth in the morning., Disp: , Rfl:    zolpidem (AMBIEN CR) 12.5 MG CR tablet, Take 12.5 mg by mouth at bedtime as needed for sleep., Disp: , Rfl:   Allergies:  Allergies  Allergen Reactions   Ibuprofen Other (See Comments)    Does not take due to kidney function    Levodopa Other (See Comments)    Increase of dopamine levels   Oxycodone Other (See Comments)    Pt cannot tolerate oxycodone- makes her "loopy"/ AMS    Past Medical History, Surgical history, Social history, and Family History were reviewed and updated.  Review of Systems: Review of Systems  Constitutional: Negative.   HENT:  Negative.    Eyes: Negative.   Respiratory: Negative.    Cardiovascular: Negative.   Gastrointestinal: Negative.   Endocrine: Negative.   Genitourinary: Negative.    Musculoskeletal: Negative.   Skin: Negative.   Neurological: Negative.   Hematological: Negative.   Psychiatric/Behavioral: Negative.      Physical Exam: Vital signs are temperature of 98.3.  Pulse 79.  Blood pressure 111/62.  Weight is 228 pounds.  Wt Readings from Last 3 Encounters:  10/21/23 233 lb (105.7  kg)  09/12/23 236 lb (107 kg)  05/27/23 238 lb 12.8 oz (108.3 kg)    Physical Exam Vitals reviewed.  Constitutional:      Comments: Her breast exam shows the right mastectomy.  This is healing.  She has no erythema or warmth.  There is no skin breakdown.  There is no right axillary adenopathy.  Left breast shows lumpectomy scar at the 12 o'clock position.  There  is no mass in the left breast.  There is no double discharge.  There is no left axillary adenopathy.  HENT:     Head: Normocephalic and atraumatic.  Eyes:     Pupils: Pupils are equal, round, and reactive to light.  Cardiovascular:     Rate and Rhythm: Normal rate and regular rhythm.     Heart sounds: Normal heart sounds.  Pulmonary:     Effort: Pulmonary effort is normal.     Breath sounds: Normal breath sounds.  Abdominal:     General: Bowel sounds are normal.     Palpations: Abdomen is soft.  Musculoskeletal:        General: No tenderness or deformity. Normal range of motion.     Cervical back: Normal range of motion.  Lymphadenopathy:     Cervical: No cervical adenopathy.  Skin:    General: Skin is warm and dry.     Findings: No erythema or rash.  Neurological:     Mental Status: She is alert and oriented to person, place, and time.  Psychiatric:        Behavior: Behavior normal.        Thought Content: Thought content normal.        Judgment: Judgment normal.      Lab Results  Component Value Date   WBC 8.5 11/17/2023   HGB 11.6 (L) 11/17/2023   HCT 34.2 (L) 11/17/2023   MCV 100.3 (H) 11/17/2023   PLT 277 11/17/2023     Chemistry      Component Value Date/Time   NA 139 11/17/2023 1008   K 4.1 11/17/2023 1008   CL 104 11/17/2023 1008   CO2 23 11/17/2023 1008   BUN 45 (H) 11/17/2023 1008   CREATININE 1.85 (H) 11/17/2023 1008      Component Value Date/Time   CALCIUM 11.2 (H) 11/17/2023 1008   ALKPHOS 84 11/17/2023 1008   AST 18 11/17/2023 1008   ALT 13 11/17/2023 1008   BILITOT 0.7 11/17/2023  1008      Impression and Plan: Ms. Centrella is a very nice 77 year old postmenopausal white female.  She has a second breast cancer.  The first breast cancer was about 32 years ago.  She underwent chemotherapy and radiation after her lumpectomy.  She now has a second breast cancer.  She had a mastectomy.  She is a high Oncotype score.  I think the Oncotype score clearly indicates that she will need chemotherapy.  We talked to her at length about this.  I do not think that she would do well with Taxotere/Cytoxan.  I explained to her the side effects of treatment.  She will need to have G-CSF afterwards so that she will not have problems with respect to infection.  I do not think that there will be any effect on her heart or on her kidneys.  I think she will probably also need to have vitamin B-12.  She does not want to have any treatment till after the holidays.  Grandchildren and children are coming in for the holidays.  I think this would be okay.  Will see about the Port-A-Cath getting put in.  I do think that 4 cycles of treatment would be appropriate for her.  Afterwards, we can then get her on aromatase inhibitor therapy.  We will plan to get her back after the holiday season.   Josph Macho, MD 12/9/20242:34 PM

## 2023-11-18 ENCOUNTER — Other Ambulatory Visit: Payer: Self-pay | Admitting: General Surgery

## 2023-11-18 ENCOUNTER — Other Ambulatory Visit: Payer: Self-pay

## 2023-11-18 LAB — CANCER ANTIGEN 27.29: CA 27.29: 29 U/mL (ref 0.0–38.6)

## 2023-11-19 ENCOUNTER — Telehealth: Payer: Self-pay | Admitting: Genetic Counselor

## 2023-11-19 ENCOUNTER — Inpatient Hospital Stay: Payer: Medicare Other

## 2023-11-19 ENCOUNTER — Ambulatory Visit: Payer: Medicare Other | Admitting: Hematology & Oncology

## 2023-11-19 ENCOUNTER — Encounter: Payer: Self-pay | Admitting: *Deleted

## 2023-11-19 ENCOUNTER — Other Ambulatory Visit: Payer: Self-pay

## 2023-11-19 NOTE — Progress Notes (Signed)
Patient's appointment was rescheduled to this past Monday when navigator was out of the office.   Due to oncotype score of 39 patient will need adjuvant chemo. Plan for four cycles of Taxotere/Cytoxan. Patient is requesting that treatment doesn't start until the new year. She is scheduled to have a port placed by surgery on 12/15/2022.  Chemo Education scheduled for 11/27/2023. Will follow up with her at that time to provide education and place referrals.   Oncology Nurse Navigator Documentation     11/19/2023   10:15 AM  Oncology Nurse Navigator Flowsheets  Navigator Follow Up Date: 11/27/2023  Navigator Follow Up Reason: Chemo Class  Navigator Location [facility]  Navigator Encounter Type Appt/Treatment Plan Review  Patient Visit Type MedOnc  Treatment Phase Active Tx  Barriers/Navigation Needs Coordination of Care;Education  Interventions None Required  Acuity Level 2-Minimal Needs (1-2 Barriers Identified)  Support Groups/Services Friends and Family  Time Spent with Patient 15

## 2023-11-19 NOTE — Telephone Encounter (Signed)
Kristin [name] contacted [place] confused about her oncotype testing and how that related to her hereditary cancer genetic testing. We reviewed her oncotype testing is showing her risk for her breast cancer recurring in another area of the body/there is nothing on oncotype testing revealing a gene mutation she was born with vs. hereditary cancer testing is looking at the DNA she was born with to determine her risk of new cancers in the future (and it was negative), but she said that does not match with what she was told during a recent physician visit so she would like additional clarification. I have reached out to Dr. Myna Hidalgo and requested he contact her to review her oncotype results again in more detail.   Lalla Brothers, MS, [facility] Genetic Counselor [email or web address] (P) [phone]

## 2023-11-21 ENCOUNTER — Other Ambulatory Visit: Payer: Self-pay | Admitting: *Deleted

## 2023-11-21 ENCOUNTER — Encounter: Payer: Self-pay | Admitting: Hematology & Oncology

## 2023-11-21 DIAGNOSIS — C50011 Malignant neoplasm of nipple and areola, right female breast: Secondary | ICD-10-CM

## 2023-11-21 DIAGNOSIS — Z853 Personal history of malignant neoplasm of breast: Secondary | ICD-10-CM

## 2023-11-24 ENCOUNTER — Other Ambulatory Visit: Payer: Self-pay | Admitting: *Deleted

## 2023-11-24 DIAGNOSIS — C50011 Malignant neoplasm of nipple and areola, right female breast: Secondary | ICD-10-CM

## 2023-11-24 MED ORDER — PROCHLORPERAZINE MALEATE 10 MG PO TABS: 10.0000 mg | ORAL_TABLET | Freq: Four times a day (QID) | ORAL | 1 refills | Status: DC | PRN

## 2023-11-24 MED ORDER — ONDANSETRON HCL 8 MG PO TABS: 8.0000 mg | ORAL_TABLET | Freq: Three times a day (TID) | ORAL | 1 refills | Status: DC | PRN

## 2023-11-24 MED ORDER — DEXAMETHASONE 4 MG PO TABS: ORAL_TABLET | ORAL | 1 refills | Status: DC

## 2023-11-25 ENCOUNTER — Other Ambulatory Visit: Payer: Self-pay | Admitting: *Deleted

## 2023-11-25 ENCOUNTER — Inpatient Hospital Stay: Payer: Medicare Other

## 2023-11-25 DIAGNOSIS — C50011 Malignant neoplasm of nipple and areola, right female breast: Secondary | ICD-10-CM

## 2023-11-26 ENCOUNTER — Encounter: Payer: Self-pay | Admitting: *Deleted

## 2023-11-26 ENCOUNTER — Other Ambulatory Visit: Payer: Self-pay | Admitting: *Deleted

## 2023-11-26 ENCOUNTER — Encounter: Payer: Self-pay | Admitting: Hematology & Oncology

## 2023-11-26 NOTE — Progress Notes (Unsigned)
Patient in chemotherapy education class by herself.  Discussed side effects of Taxotere and Cytoxan which include but are not limited to myelosuppression, decreased appetite, fatigue, fever, allergic or infusional reaction, mucositis, cardiac toxicity, cough, SOB, altered taste, nausea and vomiting, diarrhea, constipation, elevated LFTs myalgia and arthralgias, hair loss or thinning, rash, skin dryness, nail changes, peripheral neuropathy, discolored urine, delayed wound healing, mental changes (Chemo brain), increased risk of infections, weight loss.  Reviewed infusion room and office policy and procedure and phone numbers 24 hours x 7 days a week.  Reviewed when to call the office with any concerns or problems.  Transport planner given.  Discussed portacath insertion and EMLA cream administration.  Antiemetic protocol and chemotherapy schedule reviewed. Patient verbalized understanding of chemotherapy indications and possible side effects.  Teachback done

## 2023-11-27 ENCOUNTER — Ambulatory Visit: Payer: Medicare Other

## 2023-11-27 ENCOUNTER — Inpatient Hospital Stay: Payer: Medicare Other

## 2023-11-27 ENCOUNTER — Other Ambulatory Visit: Payer: Self-pay

## 2023-11-28 ENCOUNTER — Ambulatory Visit (HOSPITAL_BASED_OUTPATIENT_CLINIC_OR_DEPARTMENT_OTHER)
Admission: RE | Admit: 2023-11-28 | Discharge: 2023-11-28 | Disposition: A | Discharge status: Home / Self Care | Payer: Medicare Other | Source: Ambulatory Visit | Attending: Hematology & Oncology | Admitting: Hematology & Oncology

## 2023-11-28 ENCOUNTER — Encounter (HOSPITAL_BASED_OUTPATIENT_CLINIC_OR_DEPARTMENT_OTHER): Payer: Self-pay

## 2023-11-28 ENCOUNTER — Encounter: Payer: Self-pay | Admitting: *Deleted

## 2023-11-28 ENCOUNTER — Other Ambulatory Visit: Payer: Self-pay | Admitting: Hematology & Oncology

## 2023-11-28 DIAGNOSIS — C50011 Malignant neoplasm of nipple and areola, right female breast: Secondary | ICD-10-CM

## 2023-11-28 DIAGNOSIS — Z853 Personal history of malignant neoplasm of breast: Secondary | ICD-10-CM

## 2023-12-02 ENCOUNTER — Encounter: Payer: Self-pay | Admitting: Hematology & Oncology

## 2023-12-02 ENCOUNTER — Encounter: Payer: Self-pay | Admitting: *Deleted

## 2023-12-02 NOTE — Progress Notes (Signed)
Reviewed patient's CT scan which is negative for any metastatic disease.   Oncology Nurse Navigator Documentation     12/02/2023    9:45 AM  Oncology Nurse Navigator Flowsheets  Navigator Follow Up Date: 12/19/2023  Navigator Follow Up Reason: Chemotherapy  Navigator Location [facility]  Navigator Encounter Type Scan Review  Patient Visit Type MedOnc  Treatment Phase Active Tx  Barriers/Navigation Needs Coordination of Care;Education  Interventions None Required  Acuity Level 2-Minimal Needs (1-2 Barriers Identified)  Support Groups/Services Friends and Family  Time Spent with Patient 15

## 2023-12-04 ENCOUNTER — Ambulatory Visit: Payer: Medicare Other | Admitting: Hematology & Oncology

## 2023-12-04 ENCOUNTER — Inpatient Hospital Stay: Payer: Medicare Other

## 2023-12-09 ENCOUNTER — Encounter: Payer: Self-pay | Admitting: Hematology & Oncology

## 2023-12-10 ENCOUNTER — Encounter: Payer: Self-pay | Admitting: Hematology & Oncology

## 2023-12-11 NOTE — Progress Notes (Signed)
 Pharmacist Chemotherapy Monitoring - Initial Assessment    Anticipated start date: 12/19/23   The following has been reviewed per standard work regarding the patient's treatment regimen: The patient's diagnosis, treatment plan and drug doses, and organ/hematologic function Lab orders and baseline tests specific to treatment regimen  The treatment plan start date, drug sequencing, and pre-medications Prior authorization status  Patient's documented medication list, including drug-drug interaction screen and prescriptions for anti-emetics and supportive care specific to the treatment regimen The drug concentrations, fluid compatibility, administration routes, and timing of the medications to be used The patient's access for treatment and lifetime cumulative dose history, if applicable  The patient's medication allergies and previous infusion related reactions, if applicable   Changes made to treatment plan:  N/A  Follow up needed:  N/A   Micael Olam Browning, [facility] 12/11/2023  2:31 PM

## 2023-12-12 ENCOUNTER — Encounter: Payer: Self-pay | Admitting: Hematology & Oncology

## 2023-12-15 ENCOUNTER — Encounter (HOSPITAL_COMMUNITY): Payer: Self-pay | Admitting: General Surgery

## 2023-12-15 NOTE — Progress Notes (Signed)
 SDW call  Patient was given pre-op instructions over the phone. Patient verbalized understanding of instructions provided.     PCP - Silvano Angelica, NP EP Cardiologist: [facility] Cardiologist - Dr. Sherron Champ Pulmonary: Dr. Eva Gum Nephrologist: Dr. Carlette Oncologist: Dr. Maude Crease   PPM/ICD - [employer], [facility], [place], [place] Device Orders - requested x 2 Rep Notified - [facility]   Chest x-ray - 10/01/2023 EKG -  09/25/2023 Stress Test - 2008, [facility]  ECHO - 09/12/2023 Cardiac Cath - 01/29/2022  Sleep Study/sleep apnea/CPAP: OSA with CPAP  Type II diabetic, CGM to left arm Fasting Blood sugar range: around 109 How often check sugars: continuous Jardiance, states last dose 12/15/2023 Trulicity, states last dose 12/07/2023   Blood Thinner Instructions: denies Aspirin  Instructions:states last dose 12/11/2023   ERAS Protcol - Clears until 0830   Anesthesia review: Yes. HTN, DM, pacemaker, OSA with CPAP, CKD   Patient denies shortness of breath, fever, cough and chest pain over the phone call  Your procedure is scheduled on Tuesday December 16, 2023  Report to [facility] Main Entrance A at 0900  A.M., then check in with the Admitting office.  Call this number if you have problems the morning of surgery:  [phone]   If you have any questions prior to your surgery date call [phone]: Open Monday-Friday 8am-4pm If you experience any cold or flu symptoms such as cough, fever, chills, shortness of breath, etc. between now and your scheduled surgery, please notify us  at the above number    Remember:  Do not eat after midnight the night before your surgery  You may drink clear liquids until  0830 the morning of your surgery.   Clear liquids allowed are: Water , Non-Citrus Juices (without pulp), Carbonated Beverages, Clear Tea, Black Coffee ONLY (NO MILK, CREAM OR POWDERED CREAMER of any kind),  and Gatorade   Take these medicines the morning of surgery with A SIP OF WATER :  Decadron , pepcid , hydralazine , isosorbide , femara , metoprolol , synthroid   As needed: Xanax , colestid , flonae  As of today, STOP taking any Aleve, Naproxen, Ibuprofen, Motrin, Advil, Goody's, BC's, all herbal medications, fish oil, and all vitamins.

## 2023-12-15 NOTE — Anesthesia Preprocedure Evaluation (Addendum)
 Anesthesia Evaluation  Patient identified by MRN, date of birth, ID band Patient awake    Reviewed: Allergy & Precautions, NPO status , Patient's Chart, lab work & pertinent test results  Airway Mallampati: III  TM Distance: >3 FB Neck ROM: Full    Dental no notable dental hx.    Pulmonary sleep apnea and Continuous Positive Airway Pressure Ventilation    Pulmonary exam normal breath sounds clear to auscultation       Cardiovascular hypertension, Pt. on medications and Pt. 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  Wall thickness is normal.  Left  ventricular function is low normal estimated with an ejection fraction of  50%.  2. The aortic valve is mildly thickened but appears to open adequately  3.  The mitral valve is pliable and appears to open adequately.  Mild  mitral insufficiency is noted  4.  The tricuspid valve is normal  5.  The left atrium is prominently enlarged  6.  Right atrium is grossly normal  7.  The interatrial septum appears to be somewhat thickened and this could  represent lipomatous hypertrophy although a mass cannot be totally  excluded  8. Right ventricular structure and function is normal  9.  The pericardium is normal in thickness there is no effusion  10.  TEE is suggested to exclude interatrial septal mass     Neuro/Psych    GI/Hepatic hiatal hernia,GERD  ,,  Endo/Other  diabetes, Type 2Hypothyroidism    Renal/GU Renal InsufficiencyRenal diseaseLab Results      Component                Value               Date                                 K                        4.1                 11/17/2023                     BUN                       45 (H)              11/17/2023                CREATININE               1.85 (H)            11/17/2023                GFRNONAA                 28 (L)              11/17/2023                                Musculoskeletal  (+) Arthritis ,    Abdominal   Peds  Hematology  (+)  Blood dyscrasia, anemia Lab Results      Component                Value               Date                      HGB                      11.6 (L)            11/17/2023                HCT                      34.2 (L)            11/17/2023                   PLT                      277                 11/17/2023              Anesthesia Other Findings All: ibuprofen Levodopa, oxycodone   R Breast CA s/p L masectomy  Reproductive/Obstetrics                             Anesthesia Physical Anesthesia Plan  ASA: 3  Anesthesia Plan: General   Post-op Pain Management: Tylenol  PO (pre-op)*   Induction: Intravenous  PONV Risk Score and Plan: 4 or greater and Propofol  infusion, TIVA, Treatment may vary due to age or medical condition and Ondansetron   Airway Management Planned: LMA  Additional Equipment: None  Intra-op Plan:   Post-operative Plan:   Informed Consent: I have reviewed the patients History and Physical, chart, labs and discussed the procedure including the risks, benefits and alternatives for the proposed anesthesia with the patient or authorized representative who has indicated his/her understanding and acceptance.     Dental advisory given  Plan Discussed with: CRNA and Anesthesiologist  Anesthesia Plan Comments: ( )        Anesthesia Quick Evaluation

## 2023-12-15 NOTE — Progress Notes (Signed)
 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  Echo 09/12/2023 showed EF 50%, no significant valvular abnormalities.  She was last seen by cardiologist Dr. Lilian 09/15/2023 for discussion of pacemaker implant per note, She is planning bilateral mastectomy for her breast cancer recurrence. Briefly, patient is known to have trifascicular blocker. She had a recent echocardiogram that demonstrated low-normal LVEF, mildly thickened aortic valve with no regurgitation, mild mitral valve insufficiency and thickened interatrial septum suggestive of lipomatous hypertrophy although a mass cannot be totally excluded. TEE was recommended. Patient did have a TEE in 2023 which did not show any cardiac mass or significant abnormality. We also discussed her conduction system disease and upcoming surgery. Pacemaker implantation recommended so she can undergo surgery more safely. Reviewed procedure and associated risks. She is agreeable to have pacemaker implanted at the earliest available.     She did subsequently have Right side Medtronic pacemaker implant on 09/23/2023 which was complicated by postoperative pneumothorax.  This resolved with chest tube placement. Last seen in followup 11/24/23. Per note, Interrogation of the device today demonstrates good pacing and sensing function and no episodes. Patient reports she will start chemotherapy with taxotere  and has questions about cardiotoxicity. This was discussed in detail. She is also concerned about some calcification noted in CT chest. She will discuss this with her oncologist to be sure she does not have bone involvement. In follow up today she reports feeling well overall with no chest pain, shortness of breath or palpitations. Her recent echocardiogram showed low-normal LVEF 50% and thickened interatrial  septum which most likely corresponds to lipomatous hypertrophy. We discussed this finding and agreed to avoid TEE and continue with her cancer treatment as main focus.  Periop cardiac device form has been faxed.   History of CKD 4, most recent creatinine 1.85 on 01/17/23.   Non-insulin -dependent DM2, A1c 5.8 on 09/25/2023.  She is on once weekly GLP-1 agonist, last dose12/29/24.  Recently underwent L mastectomy 10/21/23 without complication. Remote history of R breast cancer ~30 years ago treated with lumpectomy and adjuvant chemoradiation.    Pt will need DOS evaluation.  EKG 09/25/23 (care everywhere): Normal sinus rhythm. Rate 75. Left axis deviation. Nonspecific intraventricular block . Possible Lateral infarct (cited on or before 25-Sep-2023). Inferior infarct (cited on or before 25-Sep-2023)    TTE 09/12/2023 (Care Everywhere): Adequate 2D M-mode and color-flow Doppler echocardiogram demonstrates  normal left ventricular chamber size.  Wall thickness is normal.  Left  ventricular function is low normal estimated with an ejection fraction of  50%.  2. The aortic valve is mildly thickened but appears to open adequately  3.  The mitral valve is pliable and appears to open adequately.  Mild  mitral insufficiency is noted  4.  The tricuspid valve is normal  5.  The left atrium is prominently enlarged  6.  Right atrium is grossly normal  7.  The interatrial septum appears to be somewhat thickened and this could  represent lipomatous hypertrophy although a mass cannot be totally  excluded  8. Right ventricular structure and function is normal  9.  The pericardium is normal in thickness there is no effusion  10.  TEE is suggested to exclude interatrial septal mass    Cardiac cath 01/29/2022 (Care Everywhere): DIAGNOSTIC FINDINGS     1.  Normal coronary arteriogram.  Right dominant system.  2.  Normal LVEDP, 13 mmHg.  A ventriculogram was not performed in order to  reduce contrast  exposure in this patient with renal insufficiency.  3.  Low normal cardiac output, cardiac output 4.4 L-min.  Cardiac index  2.1  4. High-normal pulmonary artery pressure, 36-17 mmHg, mean 24 mmHg.        Lynwood Geofm RIGGERS [facility] Short Stay Center/Anesthesiology Phone [phone] 12/15/2023 1:14 PM

## 2023-12-16 ENCOUNTER — Other Ambulatory Visit: Payer: Self-pay

## 2023-12-16 ENCOUNTER — Encounter (HOSPITAL_COMMUNITY)
Admission: RE | Disposition: A | Discharge status: Home / Self Care | Payer: Self-pay | Source: Ambulatory Visit | Attending: General Surgery

## 2023-12-16 ENCOUNTER — Ambulatory Visit (HOSPITAL_COMMUNITY)
Admission: RE | Admit: 2023-12-16 | Discharge: 2023-12-16 | Disposition: A | Discharge status: Home / Self Care | Payer: Medicare Other | Source: Ambulatory Visit | Attending: General Surgery | Admitting: General Surgery

## 2023-12-16 ENCOUNTER — Ambulatory Visit (HOSPITAL_BASED_OUTPATIENT_CLINIC_OR_DEPARTMENT_OTHER): Payer: Medicare Other | Admitting: Vascular Surgery

## 2023-12-16 ENCOUNTER — Ambulatory Visit (HOSPITAL_COMMUNITY): Payer: Medicare Other

## 2023-12-16 ENCOUNTER — Encounter (HOSPITAL_COMMUNITY): Payer: Self-pay | Admitting: General Surgery

## 2023-12-16 ENCOUNTER — Ambulatory Visit (HOSPITAL_COMMUNITY): Payer: Self-pay | Admitting: Vascular Surgery

## 2023-12-16 DIAGNOSIS — Z9013 Acquired absence of bilateral breasts and nipples: Secondary | ICD-10-CM | POA: Insufficient documentation

## 2023-12-16 DIAGNOSIS — Z950 Presence of cardiac pacemaker: Secondary | ICD-10-CM | POA: Diagnosis not present

## 2023-12-16 DIAGNOSIS — I453 Trifascicular block: Secondary | ICD-10-CM | POA: Insufficient documentation

## 2023-12-16 DIAGNOSIS — G4733 Obstructive sleep apnea (adult) (pediatric): Secondary | ICD-10-CM

## 2023-12-16 DIAGNOSIS — I43 Cardiomyopathy in diseases classified elsewhere: Secondary | ICD-10-CM | POA: Insufficient documentation

## 2023-12-16 DIAGNOSIS — E1122 Type 2 diabetes mellitus with diabetic chronic kidney disease: Secondary | ICD-10-CM | POA: Insufficient documentation

## 2023-12-16 DIAGNOSIS — E039 Hypothyroidism, unspecified: Secondary | ICD-10-CM

## 2023-12-16 DIAGNOSIS — Z803 Family history of malignant neoplasm of breast: Secondary | ICD-10-CM | POA: Diagnosis not present

## 2023-12-16 DIAGNOSIS — Z79899 Other long term (current) drug therapy: Secondary | ICD-10-CM | POA: Insufficient documentation

## 2023-12-16 DIAGNOSIS — C50912 Malignant neoplasm of unspecified site of left female breast: Secondary | ICD-10-CM

## 2023-12-16 DIAGNOSIS — Z7985 Long-term (current) use of injectable non-insulin antidiabetic drugs: Secondary | ICD-10-CM | POA: Diagnosis not present

## 2023-12-16 DIAGNOSIS — Z8249 Family history of ischemic heart disease and other diseases of the circulatory system: Secondary | ICD-10-CM | POA: Diagnosis not present

## 2023-12-16 DIAGNOSIS — I10 Essential (primary) hypertension: Secondary | ICD-10-CM

## 2023-12-16 DIAGNOSIS — Z7984 Long term (current) use of oral hypoglycemic drugs: Secondary | ICD-10-CM | POA: Insufficient documentation

## 2023-12-16 DIAGNOSIS — Z170 Estrogen receptor positive status [ER+]: Secondary | ICD-10-CM | POA: Diagnosis not present

## 2023-12-16 DIAGNOSIS — I1310 Hypertensive heart and chronic kidney disease without heart failure, with stage 1 through stage 4 chronic kidney disease, or unspecified chronic kidney disease: Secondary | ICD-10-CM | POA: Diagnosis not present

## 2023-12-16 DIAGNOSIS — N184 Chronic kidney disease, stage 4 (severe): Secondary | ICD-10-CM | POA: Insufficient documentation

## 2023-12-16 HISTORY — PX: PORTACATH PLACEMENT: SHX2246

## 2023-12-16 LAB — GLUCOSE, CAPILLARY
Glucose-Capillary: 126 mg/dL — ABNORMAL HIGH (ref 70–99)
Glucose-Capillary: 83 mg/dL (ref 70–99)
Glucose-Capillary: 92 mg/dL (ref 70–99)

## 2023-12-16 SURGERY — INSERTION, TUNNELED CENTRAL VENOUS DEVICE, WITH PORT
Anesthesia: General

## 2023-12-16 MED ORDER — MIDAZOLAM HCL 2 MG/2ML IJ SOLN
0.5000 mg | Freq: Once | INTRAMUSCULAR | Status: DC | PRN
Start: 2023-12-16 — End: 2023-12-16

## 2023-12-16 MED ORDER — HEPARIN SOD (PORK) LOCK FLUSH 100 UNIT/ML IV SOLN
INTRAVENOUS | Status: AC
  Filled 2023-12-16: qty 5

## 2023-12-16 MED ORDER — ACETAMINOPHEN 650 MG RE SUPP: 650.0000 mg | RECTAL | Status: DC | PRN

## 2023-12-16 MED ORDER — DEXAMETHASONE SODIUM PHOSPHATE 10 MG/ML IJ SOLN
INTRAMUSCULAR | Status: DC | PRN
  Administered 2023-12-16: 10 mg via INTRAVENOUS

## 2023-12-16 MED ORDER — CHLORHEXIDINE GLUCONATE CLOTH 2 % EX PADS: 6.0000 | MEDICATED_PAD | Freq: Once | CUTANEOUS | Status: DC

## 2023-12-16 MED ORDER — PROPOFOL 10 MG/ML IV BOLUS
INTRAVENOUS | Status: DC | PRN
  Administered 2023-12-16: 100 mg via INTRAVENOUS
  Administered 2023-12-16 (×2): 50 mg via INTRAVENOUS

## 2023-12-16 MED ORDER — ONDANSETRON HCL 4 MG/2ML IJ SOLN
INTRAMUSCULAR | Status: AC
  Filled 2023-12-16: qty 2

## 2023-12-16 MED ORDER — ONDANSETRON HCL 4 MG/2ML IJ SOLN
INTRAMUSCULAR | Status: DC | PRN
  Administered 2023-12-16: 4 mg via INTRAVENOUS

## 2023-12-16 MED ORDER — ACETAMINOPHEN 325 MG PO TABS: 650.0000 mg | ORAL_TABLET | ORAL | Status: DC | PRN

## 2023-12-16 MED ORDER — ACETAMINOPHEN 500 MG PO TABS: 1000.0000 mg | ORAL_TABLET | Freq: Once | ORAL | Status: AC

## 2023-12-16 MED ORDER — FENTANYL CITRATE (PF) 250 MCG/5ML IJ SOLN
INTRAMUSCULAR | Status: AC
  Filled 2023-12-16: qty 5

## 2023-12-16 MED ORDER — CEFAZOLIN SODIUM-DEXTROSE 2-4 GM/100ML-% IV SOLN
INTRAVENOUS | Status: AC
  Filled 2023-12-16: qty 100

## 2023-12-16 MED ORDER — SODIUM CHLORIDE 0.9 % IV SOLN: INTRAVENOUS | Status: DC | PRN

## 2023-12-16 MED ORDER — SODIUM CHLORIDE 0.9% FLUSH: 3.0000 mL | Freq: Two times a day (BID) | INTRAVENOUS | Status: DC

## 2023-12-16 MED ORDER — LACTATED RINGERS IV SOLN: INTRAVENOUS | Status: DC

## 2023-12-16 MED ORDER — CHLORHEXIDINE GLUCONATE 0.12 % MT SOLN: 15.0000 mL | Freq: Once | OROMUCOSAL | Status: AC

## 2023-12-16 MED ORDER — 0.9 % SODIUM CHLORIDE (POUR BTL) OPTIME
TOPICAL | Status: DC | PRN
  Administered 2023-12-16: 1000 mL

## 2023-12-16 MED ORDER — HEPARIN 6000 UNIT IRRIGATION SOLUTION
Status: DC | PRN
  Administered 2023-12-16: 1

## 2023-12-16 MED ORDER — ACETAMINOPHEN 500 MG PO TABS: 1000.0000 mg | ORAL_TABLET | Freq: Once | ORAL | Status: DC

## 2023-12-16 MED ORDER — PHENYLEPHRINE HCL (PRESSORS) 10 MG/ML IV SOLN
INTRAVENOUS | Status: DC | PRN
  Administered 2023-12-16: 160 ug via INTRAVENOUS
  Administered 2023-12-16 (×3): 80 ug via INTRAVENOUS
  Administered 2023-12-16 (×2): 160 ug via INTRAVENOUS
  Administered 2023-12-16: 80 ug via INTRAVENOUS

## 2023-12-16 MED ORDER — SODIUM CHLORIDE 0.9 % IV SOLN: 250.0000 mL | INTRAVENOUS | Status: DC | PRN

## 2023-12-16 MED ORDER — ACETAMINOPHEN 500 MG PO TABS
ORAL_TABLET | ORAL | Status: AC
  Administered 2023-12-16: 1000 mg via ORAL
  Filled 2023-12-16: qty 2

## 2023-12-16 MED ORDER — INSULIN ASPART 100 UNIT/ML IJ SOLN: 0.0000 [IU] | INTRAMUSCULAR | Status: DC | PRN

## 2023-12-16 MED ORDER — ACETAMINOPHEN 500 MG PO TABS: 1000.0000 mg | ORAL_TABLET | ORAL | Status: DC

## 2023-12-16 MED ORDER — CEFAZOLIN SODIUM-DEXTROSE 2-4 GM/100ML-% IV SOLN
2.0000 g | INTRAVENOUS | Status: AC
  Administered 2023-12-16: 2 g via INTRAVENOUS

## 2023-12-16 MED ORDER — HEPARIN 6000 UNIT IRRIGATION SOLUTION
Status: AC
  Filled 2023-12-16: qty 500

## 2023-12-16 MED ORDER — FENTANYL CITRATE (PF) 250 MCG/5ML IJ SOLN
INTRAMUSCULAR | Status: DC | PRN
  Administered 2023-12-16: 50 ug via INTRAVENOUS

## 2023-12-16 MED ORDER — ORAL CARE MOUTH RINSE: 15.0000 mL | Freq: Once | OROMUCOSAL | Status: AC

## 2023-12-16 MED ORDER — HYDROCODONE-ACETAMINOPHEN 5-325 MG PO TABS: 1.0000 | ORAL_TABLET | ORAL | Status: DC | PRN

## 2023-12-16 MED ORDER — ENSURE PRE-SURGERY PO LIQD: 296.0000 mL | Freq: Once | ORAL | Status: DC

## 2023-12-16 MED ORDER — HEPARIN SOD (PORK) LOCK FLUSH 100 UNIT/ML IV SOLN
INTRAVENOUS | Status: DC | PRN
  Administered 2023-12-16: 500 [IU] via INTRAVENOUS

## 2023-12-16 MED ORDER — BUPIVACAINE-EPINEPHRINE (PF) 0.25% -1:200000 IJ SOLN
INTRAMUSCULAR | Status: AC
  Filled 2023-12-16: qty 30

## 2023-12-16 MED ORDER — FENTANYL CITRATE (PF) 100 MCG/2ML IJ SOLN: 25.0000 ug | INTRAMUSCULAR | Status: DC | PRN

## 2023-12-16 MED ORDER — BUPIVACAINE HCL 0.25 % IJ SOLN
INTRAMUSCULAR | Status: DC | PRN
  Administered 2023-12-16: 8 mL

## 2023-12-16 MED ORDER — PROPOFOL 500 MG/50ML IV EMUL
INTRAVENOUS | Status: DC | PRN
  Administered 2023-12-16: 150 ug/kg/min via INTRAVENOUS

## 2023-12-16 MED ORDER — BUPIVACAINE HCL (PF) 0.25 % IJ SOLN
INTRAMUSCULAR | Status: AC
  Filled 2023-12-16: qty 30

## 2023-12-16 MED ORDER — CHLORHEXIDINE GLUCONATE 0.12 % MT SOLN
OROMUCOSAL | Status: AC
  Administered 2023-12-16: 15 mL via OROMUCOSAL
  Filled 2023-12-16: qty 15

## 2023-12-16 MED ORDER — SODIUM CHLORIDE 0.9% FLUSH: 3.0000 mL | INTRAVENOUS | Status: DC | PRN

## 2023-12-16 SURGICAL SUPPLY — 37 items
BAG COUNTER SPONGE SURGICOUNT (BAG) ×1 IMPLANT
BAG DECANTER FOR FLEXI CONT (MISCELLANEOUS) ×1 IMPLANT
CHLORAPREP W/TINT 26 (MISCELLANEOUS) ×1 IMPLANT
COVER PROBE W GEL 5X96 (DRAPES) ×1 IMPLANT
COVER SURGICAL LIGHT HANDLE (MISCELLANEOUS) ×1 IMPLANT
DERMABOND ADVANCED .7 DNX12 (GAUZE/BANDAGES/DRESSINGS) ×1 IMPLANT
DRAPE C-ARM 42X120 X-RAY (DRAPES) ×1 IMPLANT
DRAPE CHEST BREAST 15X10 FENES (DRAPES) ×1 IMPLANT
ELECT CAUTERY BLADE 6.4 (BLADE) ×1 IMPLANT
ELECT REM PT RETURN 9FT ADLT (ELECTROSURGICAL) ×1 IMPLANT
ELECTRODE REM PT RTRN 9FT ADLT (ELECTROSURGICAL) ×1 IMPLANT
GAUZE 4X4 16PLY ~~LOC~~+RFID DBL (SPONGE) ×1 IMPLANT
GEL ULTRASOUND 20GR AQUASONIC (MISCELLANEOUS) ×1 IMPLANT
GLOVE BIO SURGEON STRL SZ7 (GLOVE) ×1 IMPLANT
GLOVE BIOGEL PI IND STRL 7.5 (GLOVE) ×1 IMPLANT
GOWN STRL REUS W/ TWL LRG LVL3 (GOWN DISPOSABLE) ×2 IMPLANT
INTRODUCER COOK 11FR (CATHETERS) IMPLANT
KIT BASIN OR (CUSTOM PROCEDURE TRAY) ×1 IMPLANT
KIT PORT POWER 8FR ISP CVUE (Port) ×1 IMPLANT
KIT TURNOVER KIT B (KITS) ×1 IMPLANT
NS IRRIG 1000ML POUR BTL (IV SOLUTION) ×1 IMPLANT
PAD ARMBOARD 7.5X6 YLW CONV (MISCELLANEOUS) ×2 IMPLANT
PENCIL BUTTON HOLSTER BLD 10FT (ELECTRODE) ×1 IMPLANT
POSITIONER HEAD DONUT 9IN (MISCELLANEOUS) ×1 IMPLANT
SET INTRODUCER 12FR PACEMAKER (INTRODUCER) IMPLANT
SET SHEATH INTRODUCER 10FR (MISCELLANEOUS) IMPLANT
SHEATH COOK PEEL AWAY SET 9F (SHEATH) IMPLANT
SPIKE FLUID TRANSFER (MISCELLANEOUS) ×1 IMPLANT
STRIP CLOSURE SKIN 1/2X4 (GAUZE/BANDAGES/DRESSINGS) ×1 IMPLANT
SUT MNCRL AB 4-0 PS2 18 (SUTURE) ×1 IMPLANT
SUT PROLENE 2 0 SH DA (SUTURE) ×1 IMPLANT
SUT SILK 2-0 18XBRD TIE 12 (SUTURE) IMPLANT
SUT VIC AB 3-0 SH 27XBRD (SUTURE) ×1 IMPLANT
SYR 5ML LUER SLIP (SYRINGE) ×1 IMPLANT
TOWEL GREEN STERILE (TOWEL DISPOSABLE) ×1 IMPLANT
TOWEL GREEN STERILE FF (TOWEL DISPOSABLE) ×1 IMPLANT
TRAY LAPAROSCOPIC MC (CUSTOM PROCEDURE TRAY) ×1 IMPLANT

## 2023-12-16 NOTE — Op Note (Signed)
 Preoperative diagnosis: Breast cancer, needs venous access for chemotherapy Postoperative diagnosis: Same as above Procedure: Left internal jugular port placement with ultrasound guidance Surgeon: Dr. Adina Bury Anesthesia: General Complications: None Drains: None Specimens: None Sponge needle count was correct at completion Disposition to recovery stable condition  Indications: This is 78 year old female I have done a mastectomy on for breast cancer.  She needs chemotherapy and is due to begin that Friday.  We discussed venous access with the port.  Procedure: After informed consent was obtained she was taken to the operating room.  She was given antibiotics.  SCDs were placed.  She was placed under anesthesia without complication.  She was then prepped and draped in a standard sterile surgical fashion.  Surgical timeout was then performed.  I did this on the left side and she has a pacemaker on the right side.  I was able to identify the internal jugular vein with the ultrasound.  I made a small nick in the skin.  I then accessed the internal jugular vein under ultrasound guidance with a needle.  I placed a wire.  This was confirmed to be in the correct position.  I then infiltrated Marcaine  below the clavicle.  I made incision developed a pocket.  I tunneled the line between the 2 sites.  I was then able to place the dilator under fluoroscopic guidance.  I then placed the line through the dilator and pulled this back to be at the cavoatrial junction and the line is ready for use now.  This is little bit hard to discern given the pacemaker but this clearly was in the correct position and is ready for use at this point.  I then connected the line to the port.  This was sutured into place with a Prolene suture.  I then aspirated blood and flushed this.  Hip was packed with heparin .  I then closed this with 3-0 Vicryl for Monocryl.  Glue was placed on the incisions.  She tolerated this well was  extubated transferred recovery stable.

## 2023-12-16 NOTE — Anesthesia Procedure Notes (Signed)
 Procedure Name: Intubation Date/Time: 12/16/2023 12:00 PM  Performed by: Canda Augustin KIDD, CRNAPre-anesthesia Checklist: Patient identified, Emergency Drugs available, Suction available, Patient being monitored and Timeout performed Patient Re-evaluated:Patient Re-evaluated prior to induction Oxygen Delivery Method: Circle system utilized Preoxygenation: Pre-oxygenation with 100% oxygen Induction Type: IV induction LMA: LMA inserted LMA Size: 4.0 Number of attempts: 1

## 2023-12-16 NOTE — Anesthesia Postprocedure Evaluation (Signed)
 Anesthesia Post Note  Patient: Kristin [name]  Procedure(s) Performed: PORT PLACEMENT WITH ULTRASOUND GUIDANCE     Patient location during evaluation: PACU Anesthesia Type: General Level of consciousness: awake and alert Pain management: pain level controlled Vital Signs Assessment: post-procedure vital signs reviewed and stable Respiratory status: spontaneous breathing, nonlabored ventilation, respiratory function stable and patient connected to nasal cannula oxygen Cardiovascular status: blood pressure returned to baseline and stable Postop Assessment: no apparent nausea or vomiting Anesthetic complications: no   No notable events documented.  Last Vitals:  Vitals:   12/16/23 1315 12/16/23 1330  BP: (!) 106/59 107/61  Pulse: 69 70  Resp: 16 12  Temp:  36.6 C  SpO2: 92% 95%    Last Pain:  Vitals:   12/16/23 1315  PainSc: Asleep                 Garnette DELENA Gab

## 2023-12-16 NOTE — Discharge Instructions (Signed)
 PORT-A-CATH: POST OP INSTRUCTIONS  Always review your discharge instruction sheet given to you by the facility where your surgery was performed.   A prescription for pain medication may be given to you upon discharge. Take your pain medication as prescribed, if needed. If narcotic pain medicine is not needed, then you make take acetaminophen  (Tylenol ) or ibuprofen (Advil) as needed.  Take your usually prescribed medications unless otherwise directed. If you need a refill on your pain medication, please contact our office. All narcotic pain medicine now requires a paper prescription.  Phoned in and fax refills are no longer allowed by law.  Prescriptions will not be filled after 5 pm or on weekends.  You should follow a light diet for the remainder of the day after your procedure. Most patients will experience some mild swelling and/or bruising in the area of the incision. It may take several days to resolve. It is common to experience some constipation if taking pain medication after surgery. Increasing fluid intake and taking a stool softener (such as Colace) will usually help or prevent this problem from occurring. A mild laxative (Milk of Magnesia or Miralax) should be taken according to package directions if there are no bowel movements after 48 hours.  Unless discharge instructions indicate otherwise, you may remove your bandages 48 hours after surgery, and you may shower at that time. You may have steri-strips (small white skin tapes) in place directly over the incision.  These strips should be left on the skin for 7-10 days.  If your surgeon used Dermabond (skin glue) on the incision, you may shower in 24 hours.  The glue will flake off over the next 2-3 weeks.  If your port is left accessed at the end of surgery (needle left in port), the dressing cannot get wet and should only by changed by a healthcare professional. When the port is no longer accessed (when the needle has been removed), follow  step 7.   ACTIVITIES:  Limit activity involving your arms for the next 72 hours. Do no strenuous exercise or activity for 1 week. You may drive when you are no longer taking prescription pain medication, you can comfortably wear a seatbelt, and you can maneuver your car. 10.You may need to see your doctor in the office for a follow-up appointment.  Please       check with your doctor.  11.When you receive a new Port-a-Cath, you will get a product guide and        ID card.  Please keep them in case you need them.  WHEN TO CALL YOUR DOCTOR [phone]): Fever over 101.0 Chills Continued bleeding from incision Increased redness and tenderness at the site Shortness of breath, difficulty breathing   The clinic staff is available to answer your questions during regular business hours. Please don't hesitate to call and ask to speak to one of the nurses or medical assistants for clinical concerns. If you have a medical emergency, go to the nearest emergency room or call 911.  A surgeon from [facility] is always on call at the hospital.     For further information, please visit [email or web address]

## 2023-12-16 NOTE — H&P (Signed)
 78 year old female who has a prior history when she was 78 years old of an invasive ductal carcinoma of the left breast. She underwent a lumpectomy as well as an axillary lymph node dissection and she states she had 14 nodes that were negative at that time. This was followed by radiation and chemotherapy. She has done well since then. On her a recent mammogram she was noted to have some distortion possible mass and calcifications in the left breast. The right breast was negative. Diagnostic mammogram showed a suspicious 1.5 cm mass in the left breast at the 12 o'clock position as well as a 9 mm mass at the 9 o'clock position. There is suspicious calcifications in the inner left breast measuring 7 cm. She underwent biopsy of 2 of these areas this was done on both of the masses. Her pathology returned as invasive ductal carcinoma. This is greater than 90% ER positive, 5% weak PR positive, Ki-67 is 18% and the HER2 on final is negative. This is a grade 2. She has undergone mastectomy and now needs a port for chemotherapy.   Review of Systems: A complete review of systems was obtained from the patient. I have reviewed this information and discussed as appropriate with the patient. See HPI as well for other ROS.  Review of Systems  Gastrointestinal: Positive for diarrhea and heartburn.  All other systems reviewed and are negative.  Medical History: Past Medical History:  Diagnosis Date  Chronic kidney disease  Diabetes mellitus without complication (CMS/HHS-HCC)  GERD (gastroesophageal reflux disease)  History of cancer  Hyperlipidemia  Hypertension   Patient Active Problem List  Diagnosis  Malignant neoplasm of female breast (CMS/HHS-HCC)   Past Surgical History:  Procedure Laterality Date  CHOLECYSTECTOMY  JOINT REPLACEMENT   Allergies  Allergen Reactions  Levodopa Other (See Comments)  INCREASE DOPAMINE LEVELS  Nsaids (Non-Steroidal Anti-Inflammatory Drug) Other (See Comments)   KIDNEY FUNCTION  Oxycodone  Other (See Comments)  MAKES HER LOOPY   Current Outpatient Medications on File Prior to Visit  Medication Sig Dispense Refill  atorvastatin  (LIPITOR) 40 MG tablet Take 40 mg by mouth once daily  colestipoL  (COLESTID ) 1 gram tablet at bedtime as needed  gabapentin  (NEURONTIN ) 300 mg/6 mL solution  isosorbide  mononitrate (IMDUR ) 30 MG ER tablet  JARDIANCE 10 mg tablet  levothyroxine  (SYNTHROID ) 75 MCG tablet  metoprolol  succinate (TOPROL -XL) 50 MG XL tablet  TRULICITY 4.5 mg/0.5 mL subcutaneous pen injector Inject subcutaneously  zolpidem  (AMBIEN  CR) 12.5 MG CR tablet   Family History  Problem Relation Age of Onset  Obesity Mother  High blood pressure (Hypertension) Mother  Breast cancer Mother    Social History   Tobacco Use  Smoking Status Never  Smokeless Tobacco Never  Marital status: Married  Tobacco Use  Smoking status: Never  Smokeless tobacco: Never  Vaping Use  Vaping status: Never Used  Substance and Sexual Activity  Alcohol use: Not Currently  Drug use: Never   Objective:  BP: (!) 144/82  Pulse: 89  Temp: 36.6 C (97.9 F)  SpO2: 99%  Weight: (!) 108.8 kg (239 lb 12.8 oz)  Height: 162.6 cm (5' 4)  PainSc: 0-No pain   Body mass index is 41.16 kg/m.  Physical Exam Vitals reviewed.  Constitutional:  Appearance: Normal appearance.  Chest:  Breasts: Right: No inverted nipple, mass or nipple discharge.  Left: No inverted nipple, mass or nipple discharge.  Comments: Shrunken left breast c/w radiotherapy Lymphadenopathy:  Upper Body:  Right upper body: No supraclavicular or axillary  adenopathy.  Left upper body: No supraclavicular or axillary adenopathy.  Neurological:  Mental Status: She is alert.   Assessment and Plan:   Port placement

## 2023-12-16 NOTE — Transfer of Care (Signed)
 Immediate Anesthesia Transfer of Care Note  Patient: Kristin [name]  Procedure(s) Performed: PORT PLACEMENT WITH ULTRASOUND GUIDANCE  Patient Location: PACU  Anesthesia Type:General  Level of Consciousness: awake, alert , and oriented  Airway & Oxygen Therapy: Patient Spontanous Breathing  Post-op Assessment: Report given to RN, Post -op Vital signs reviewed and stable, Patient moving all extremities X 4, and Patient able to stick tongue midline  Post vital signs: Reviewed and stable  Last Vitals:  Vitals Value Taken Time  BP 94/79   Temp 98.6   Pulse 41 12/16/23 1248  Resp 13 12/16/23 1248  SpO2 95 % 12/16/23 1248  Vitals shown include unfiled device data.  Last Pain:  Vitals:   12/16/23 0941  PainSc: 0-No pain         Complications: No notable events documented.

## 2023-12-17 ENCOUNTER — Inpatient Hospital Stay: Payer: Medicare Other

## 2023-12-17 ENCOUNTER — Encounter (HOSPITAL_COMMUNITY): Payer: Self-pay | Admitting: General Surgery

## 2023-12-17 ENCOUNTER — Inpatient Hospital Stay: Payer: Medicare Other | Admitting: Hematology & Oncology

## 2023-12-19 ENCOUNTER — Encounter: Payer: Self-pay | Admitting: *Deleted

## 2023-12-19 ENCOUNTER — Inpatient Hospital Stay: Payer: Medicare Other

## 2023-12-19 ENCOUNTER — Other Ambulatory Visit: Payer: Self-pay | Admitting: *Deleted

## 2023-12-19 ENCOUNTER — Encounter: Payer: Self-pay | Admitting: Hematology & Oncology

## 2023-12-19 ENCOUNTER — Inpatient Hospital Stay: Payer: Medicare Other | Attending: Hematology & Oncology

## 2023-12-19 ENCOUNTER — Inpatient Hospital Stay (HOSPITAL_BASED_OUTPATIENT_CLINIC_OR_DEPARTMENT_OTHER): Payer: Medicare Other | Admitting: Hematology & Oncology

## 2023-12-19 VITALS — BP 130/74 | HR 70 | Temp 98.0°F | Resp 18

## 2023-12-19 VITALS — BP 133/67 | HR 87 | Temp 98.0°F | Resp 20 | Ht 64.0 in | Wt 233.0 lb

## 2023-12-19 DIAGNOSIS — Z170 Estrogen receptor positive status [ER+]: Secondary | ICD-10-CM | POA: Diagnosis not present

## 2023-12-19 DIAGNOSIS — C50911 Malignant neoplasm of unspecified site of right female breast: Secondary | ICD-10-CM | POA: Diagnosis present

## 2023-12-19 DIAGNOSIS — Z1732 Human epidermal growth factor receptor 2 negative status: Secondary | ICD-10-CM | POA: Insufficient documentation

## 2023-12-19 DIAGNOSIS — Z5111 Encounter for antineoplastic chemotherapy: Secondary | ICD-10-CM | POA: Diagnosis present

## 2023-12-19 DIAGNOSIS — C50011 Malignant neoplasm of nipple and areola, right female breast: Secondary | ICD-10-CM

## 2023-12-19 DIAGNOSIS — Z5189 Encounter for other specified aftercare: Secondary | ICD-10-CM | POA: Diagnosis not present

## 2023-12-19 DIAGNOSIS — Z79899 Other long term (current) drug therapy: Secondary | ICD-10-CM | POA: Insufficient documentation

## 2023-12-19 DIAGNOSIS — Z95828 Presence of other vascular implants and grafts: Secondary | ICD-10-CM

## 2023-12-19 DIAGNOSIS — Z1721 Progesterone receptor positive status: Secondary | ICD-10-CM | POA: Diagnosis not present

## 2023-12-19 LAB — CBC WITH DIFFERENTIAL (CANCER CENTER ONLY)
Abs Immature Granulocytes: 0.1 10*3/uL — ABNORMAL HIGH (ref 0.00–0.07)
Basophils Absolute: 0 10*3/uL (ref 0.0–0.1)
Basophils Relative: 0 %
Eosinophils Absolute: 0 10*3/uL (ref 0.0–0.5)
Eosinophils Relative: 0 %
HCT: 36 % (ref 36.0–46.0)
Hemoglobin: 12 g/dL (ref 12.0–15.0)
Immature Granulocytes: 1 %
Lymphocytes Relative: 8 %
Lymphs Abs: 1.3 10*3/uL (ref 0.7–4.0)
MCH: 33.5 pg (ref 26.0–34.0)
MCHC: 33.3 g/dL (ref 30.0–36.0)
MCV: 100.6 fL — ABNORMAL HIGH (ref 80.0–100.0)
Monocytes Absolute: 0.7 10*3/uL (ref 0.1–1.0)
Monocytes Relative: 4 %
Neutro Abs: 14 10*3/uL — ABNORMAL HIGH (ref 1.7–7.7)
Neutrophils Relative %: 87 %
Platelet Count: 266 10*3/uL (ref 150–400)
RBC: 3.58 MIL/uL — ABNORMAL LOW (ref 3.87–5.11)
RDW: 12.7 % (ref 11.5–15.5)
WBC Count: 16 10*3/uL — ABNORMAL HIGH (ref 4.0–10.5)
nRBC: 0 % (ref 0.0–0.2)

## 2023-12-19 LAB — CMP (CANCER CENTER ONLY)
ALT: 8 U/L (ref 0–44)
AST: 15 U/L (ref 15–41)
Albumin: 4.2 g/dL (ref 3.5–5.0)
Alkaline Phosphatase: 71 U/L (ref 38–126)
Anion gap: 9 (ref 5–15)
BUN: 38 mg/dL — ABNORMAL HIGH (ref 8–23)
CO2: 22 mmol/L (ref 22–32)
Calcium: 9.6 mg/dL (ref 8.9–10.3)
Chloride: 102 mmol/L (ref 98–111)
Creatinine: 1.83 mg/dL — ABNORMAL HIGH (ref 0.44–1.00)
GFR, Estimated: 28 mL/min — ABNORMAL LOW (ref >60)
Glucose, Bld: 242 mg/dL — ABNORMAL HIGH (ref 70–99)
Potassium: 4.7 mmol/L (ref 3.5–5.1)
Sodium: 133 mmol/L — ABNORMAL LOW (ref 135–145)
Total Bilirubin: 0.4 mg/dL (ref 0.0–1.2)
Total Protein: 6.6 g/dL (ref 6.5–8.1)

## 2023-12-19 LAB — VITAMIN B12: Vitamin B-12: 700 pg/mL (ref 180–914)

## 2023-12-19 LAB — MAGNESIUM: Magnesium: 2.4 mg/dL (ref 1.7–2.4)

## 2023-12-19 MED ORDER — CYANOCOBALAMIN 1000 MCG/ML IJ SOLN
1000.0000 ug | Freq: Once | INTRAMUSCULAR | Status: AC
Start: 2023-12-19 — End: 2023-12-19
  Administered 2023-12-19: 1000 ug via INTRAMUSCULAR
  Filled 2023-12-19: qty 1

## 2023-12-19 MED ORDER — SODIUM CHLORIDE 0.9% FLUSH
10.0000 mL | INTRAVENOUS | Status: DC | PRN
  Administered 2023-12-19: 10 mL

## 2023-12-19 MED ORDER — DEXAMETHASONE SODIUM PHOSPHATE 10 MG/ML IJ SOLN
10.0000 mg | Freq: Once | INTRAMUSCULAR | Status: AC
  Administered 2023-12-19: 10 mg via INTRAVENOUS
  Filled 2023-12-19: qty 1

## 2023-12-19 MED ORDER — HEPARIN SOD (PORK) LOCK FLUSH 100 UNIT/ML IV SOLN
500.0000 [IU] | Freq: Once | INTRAVENOUS | Status: AC | PRN
  Administered 2023-12-19: 500 [IU]

## 2023-12-19 MED ORDER — LEVOFLOXACIN 250 MG PO TABS: 250.0000 mg | ORAL_TABLET | Freq: Every day | ORAL | 4 refills | Status: DC

## 2023-12-19 MED ORDER — PALONOSETRON HCL INJECTION 0.25 MG/5ML
0.2500 mg | Freq: Once | INTRAVENOUS | Status: AC
  Administered 2023-12-19: 0.25 mg via INTRAVENOUS
  Filled 2023-12-19: qty 5

## 2023-12-19 MED ORDER — SODIUM CHLORIDE 0.9 % IV SOLN
600.0000 mg/m2 | Freq: Once | INTRAVENOUS | Status: AC
  Administered 2023-12-19: 1300 mg via INTRAVENOUS
  Filled 2023-12-19: qty 65

## 2023-12-19 MED ORDER — SODIUM CHLORIDE 0.9 % IV SOLN: INTRAVENOUS | Status: DC

## 2023-12-19 MED ORDER — SODIUM CHLORIDE 0.9 % IV SOLN
67.5000 mg/m2 | Freq: Once | INTRAVENOUS | Status: AC
  Administered 2023-12-19: 160 mg via INTRAVENOUS
  Filled 2023-12-19: qty 16

## 2023-12-19 MED ORDER — LIDOCAINE-PRILOCAINE 2.5-2.5 % EX CREA: TOPICAL_CREAM | CUTANEOUS | 3 refills | Status: AC

## 2023-12-19 MED ORDER — LORAZEPAM 2 MG/ML IJ SOLN
0.5000 mg | INTRAMUSCULAR | Status: AC
  Administered 2023-12-19: 0.5 mg via INTRAVENOUS
  Filled 2023-12-19: qty 1

## 2023-12-19 NOTE — Progress Notes (Signed)
 OK to treat with creat-1.83 per order of Dr. Myna Hidalgo.

## 2023-12-19 NOTE — Progress Notes (Signed)
 Hematology and Oncology Follow Up Visit  Kristin [name] [id number] Apr 28, 1946 78 y.o. 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  She is looking quite good.  She had a Port-A-Cath placed yesterday.  She has wonderful range of motion of her left shoulder.  She has shoulder surgery earlier in 2024.  She really has recovered nicely from this.  She now is ready for her adjuvant chemotherapy.  We are going to use Taxotere  and Cytoxan .  This should have no issues with her cardiac status.    Her appetite is good.  She has had no change in bowel or bladder habits.  She has had no rashes.  There is been no bleeding.  Overall, I would say that her performance status is ECOG 1.    Medications:  Current Outpatient Medications:    ALPRAZolam  (XANAX ) 0.25 MG tablet, Take 0.25 mg by mouth as needed for anxiety., Disp: , Rfl:    aspirin  EC 81 MG tablet, Take 81 mg by mouth every other day. In the morning Swallow whole., Disp: , Rfl:    atorvastatin  (LIPITOR) 40 MG tablet, Take 40 mg by mouth at bedtime., Disp: , Rfl:    calcium  carbonate (CALCIUM  600) 600 MG TABS tablet, Take 600 mg by mouth 2 (two) times daily with a meal., Disp: , Rfl:    cyanocobalamin  (VITAMIN B12) 1000 MCG tablet, Take 1,000 mcg by mouth in the morning., Disp: , Rfl:    dexamethasone  (DECADRON ) 4 MG tablet, Take 2 tabs by mouth 2 times daily starting day before chemo. Then take 2 tabs daily for 2 days starting day after chemo. Take with food., Disp: 30 tablet, Rfl: 1   famotidine  (PEPCID ) 20 MG tablet, Take 20 mg by mouth 2 (two) times daily., Disp: , Rfl:    ferrous sulfate  325 (65 FE) MG tablet, Take 325 mg by mouth in the morning and at bedtime., Disp: , Rfl:    fluticasone   (FLONASE ) 50 MCG/ACT nasal spray, Place 1 spray into both nostrils daily as needed for allergies or rhinitis., Disp: , Rfl:    gabapentin  (NEURONTIN ) 300 MG capsule, Take 600 mg by mouth at bedtime., Disp: , Rfl:    hydrALAZINE  (APRESOLINE ) 25 MG tablet, Take 75 mg by mouth in the morning and at bedtime., Disp: , Rfl:    isosorbide  mononitrate (IMDUR ) 30 MG 24 hr tablet, Take 30 mg by mouth in the morning., Disp: , Rfl:    JARDIANCE 10 MG TABS tablet, Take 10 mg by mouth in the morning., Disp: , Rfl:    lidocaine -prilocaine  (EMLA ) cream, Apply a dime size of cream to port-a-cath 1-2 hours prior to access. Cover with [employer]., Disp: 30 g, Rfl: 3   lisinopril  (ZESTRIL ) 20 MG tablet, Take 20 mg by mouth in the morning., Disp: , Rfl:    MAGNESIUM  GLYCINATE PO, Take 300 mg by mouth in the morning and at bedtime., Disp: , Rfl:    Melatonin 10 MG TABS, Take 10 mg by mouth at bedtime as needed (sleep)., Disp: , Rfl:    metoprolol  succinate (TOPROL -XL) 50 MG 24 hr tablet, Take 50 mg by mouth 2 (two) times daily., Disp: , Rfl:    Multiple Vitamin (MULTIVITAMIN WITH MINERALS) TABS  tablet, Take 1 tablet by mouth in the morning., Disp: , Rfl:    niacin (SLO-NIACIN) 500 MG tablet, Take 500 mg by mouth in the morning and at bedtime., Disp: , Rfl:    Omega-3 Fatty Acids (FISH OIL) 1000 MG CAPS, Take 1,000 mg by mouth at bedtime., Disp: , Rfl:    Polyethyl Glycol-Propyl Glycol (SYSTANE) 0.4-0.3 % SOLN, Place 1 drop into both eyes 3 (three) times daily as needed (dry/irritated eyes.)., Disp: , Rfl:    SYNTHROID  75 MCG tablet, Take 75 mcg by mouth daily before breakfast., Disp: , Rfl:    TRULICITY 4.5 MG/0.5ML SOPN, Inject 4.5 mg into the skin every Monday., Disp: , Rfl:    Vitamin D-Vitamin K (VITAMIN K2-VITAMIN D3 PO), Take 1 tablet by mouth 2 (two) times a week., Disp: , Rfl:    zolpidem  (AMBIEN  CR) 12.5 MG CR tablet, Take 12.5 mg by mouth at bedtime as needed for sleep., Disp: , Rfl:    colestipol  (COLESTID ) 1  g tablet, Take 1 g by mouth daily as needed (diarrhea). (Patient not taking: Reported on 12/19/2023), Disp: , Rfl:    furosemide  (LASIX ) 20 MG tablet, Take 10 mg by mouth daily as needed for edema. (Patient not taking: Reported on 12/19/2023), Disp: , Rfl:    ondansetron  (ZOFRAN ) 8 MG tablet, Take 1 tablet (8 mg total) by mouth every 8 (eight) hours as needed for nausea or vomiting. Start on the third day after chemotherapy. (Patient not taking: Reported on 12/19/2023), Disp: 30 tablet, Rfl: 1   prochlorperazine  (COMPAZINE ) 10 MG tablet, Take 1 tablet (10 mg total) by mouth every 6 (six) hours as needed for nausea or vomiting. (Patient not taking: Reported on 12/19/2023), Disp: 30 tablet, Rfl: 1  Allergies:  Allergies  Allergen Reactions   Ibuprofen Other (See Comments)    Does not take due to kidney function    Levodopa Other (See Comments)    Increase of dopamine levels   Oxycodone  Other (See Comments)    Pt cannot tolerate oxycodone - makes her loopy/ AMS    Past Medical History, Surgical history, Social history, and Family History were reviewed and updated.  Review of Systems: Review of Systems  Constitutional: Negative.   HENT:  Negative.    Eyes: Negative.   Respiratory: Negative.    Cardiovascular: Negative.   Gastrointestinal: Negative.   Endocrine: Negative.   Genitourinary: Negative.    Musculoskeletal: Negative.   Skin: Negative.   Neurological: Negative.   Hematological: Negative.   Psychiatric/Behavioral: Negative.      Physical Exam: Vital signs are temperature of 98.  Pulse 87.  Blood pressure 133/67.  Weight is 233 pounds  Wt Readings from Last 3 Encounters:  12/19/23 233 lb (105.7 kg)  12/16/23 227 lb (103 kg)  10/21/23 233 lb (105.7 kg)    Physical Exam Vitals reviewed.  Constitutional:      Comments: Her breast exam shows the right mastectomy.  This is healing.  She has no erythema or warmth.  There is no skin breakdown.  There is no right axillary  adenopathy.  Left breast shows lumpectomy scar at the 12 o'clock position.  There is no mass in the left breast.  There is no double discharge.  There is no left axillary adenopathy.  HENT:     Head: Normocephalic and atraumatic.  Eyes:     Pupils: Pupils are equal, round, and reactive to light.  Cardiovascular:     Rate and Rhythm: Normal rate and regular  rhythm.     Heart sounds: Normal heart sounds.  Pulmonary:     Effort: Pulmonary effort is normal.     Breath sounds: Normal breath sounds.  Abdominal:     General: Bowel sounds are normal.     Palpations: Abdomen is soft.  Musculoskeletal:        General: No tenderness or deformity. Normal range of motion.     Cervical back: Normal range of motion.  Lymphadenopathy:     Cervical: No cervical adenopathy.  Skin:    General: Skin is warm and dry.     Findings: No erythema or rash.  Neurological:     Mental Status: She is alert and oriented to person, place, and time.  Psychiatric:        Behavior: Behavior normal.        Thought Content: Thought content normal.        Judgment: Judgment normal.     Lab Results  Component Value Date   WBC 16.0 (H) 12/19/2023   HGB 12.0 12/19/2023   HCT 36.0 12/19/2023   MCV 100.6 (H) 12/19/2023   PLT 266 12/19/2023     Chemistry      Component Value Date/Time   NA 139 11/17/2023 1008   K 4.1 11/17/2023 1008   CL 104 11/17/2023 1008   CO2 23 11/17/2023 1008   BUN 45 (H) 11/17/2023 1008   CREATININE 1.85 (H) 11/17/2023 1008      Component Value Date/Time   CALCIUM  11.2 (H) 11/17/2023 1008   ALKPHOS 84 11/17/2023 1008   AST 18 11/17/2023 1008   ALT 13 11/17/2023 1008   BILITOT 0.7 11/17/2023 1008      Impression and Plan: Ms. Camberos is a very nice 78 year old postmenopausal white female.  She has a second breast cancer.  The first breast cancer was about 32 years ago.  She underwent chemotherapy and radiation after her lumpectomy.  She now has a second breast cancer.  She had  a mastectomy.  She has a high Oncotype score.  I think the Oncotype score clearly indicates that she will need chemotherapy.   We will go ahead and move ahead with chemotherapy today.  We will go with the Taxotere /Cytoxan .  I probably will get her on a prophylactic antibiotic.  I know she will get G-CSF.  We will plan to get her back in 3 weeks for her second cycle.   Maude JONELLE Crease, MD 1/10/20258:54 AM

## 2023-12-19 NOTE — Progress Notes (Signed)
 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 This navigator has not yet met face to face. Saw patient in infusion.   Met with patient and gave patient Your Patient Navigator handout which explains my role, areas in which I am able to help, and all the contact information for myself and the office. Also gave patient MD and Navigator business card.   New patient packet given to patient which includes: orientation to office and staff; campus directory; education on My Chart and Advance Directives; and patient centered education on breast cancer.   Patient's husband is also a patient at [facility] and she is under a lot of stress related to his illness and now her own treatment plan. Very anxious today.    Referral to nutrition and social work placed per protocol.   Patient understands all follow up procedures and expectations. They have my number to reach out for any further clarification or additional needs.   Oncology Nurse Navigator Documentation     12/19/2023    9:00 AM  Oncology Nurse Navigator Flowsheets  Phase of Treatment Chemo  Chemotherapy Actual Start Date: 12/19/2023  Chemotherapy Expected End Date: 02/19/2024  Navigator Follow Up Date: 01/09/2024  Navigator Follow Up Reason: Follow-up Appointment;Chemotherapy  Navigator Location [facility]  Navigator Encounter Type Treatment  Treatment Initiated Date 12/19/2023  Patient Visit Type MedOnc  Treatment Phase First Chemo Tx  Barriers/Navigation Needs Coordination of Care;Education  Education Newly Diagnosed Cancer Education;Pain/ Symptom Management;Preparing for Upcoming Surgery/ Treatment  Interventions Education;Psycho-Social Support;Referrals  Acuity Level 2-Minimal Needs (1-2 Barriers Identified)  Referrals Nutrition/dietician;Social Work  Print Production Planner Groups/Services Friends and Family  Time Spent with Patient 30

## 2023-12-20 ENCOUNTER — Other Ambulatory Visit: Payer: Self-pay

## 2023-12-22 ENCOUNTER — Inpatient Hospital Stay: Payer: Medicare Other

## 2023-12-22 VITALS — BP 120/70 | HR 88 | Temp 98.4°F | Resp 16

## 2023-12-22 DIAGNOSIS — C50011 Malignant neoplasm of nipple and areola, right female breast: Secondary | ICD-10-CM

## 2023-12-22 DIAGNOSIS — Z5111 Encounter for antineoplastic chemotherapy: Secondary | ICD-10-CM | POA: Diagnosis not present

## 2023-12-22 MED ORDER — PEGFILGRASTIM-FPGK 6 MG/0.6ML ~~LOC~~ SOSY
6.0000 mg | PREFILLED_SYRINGE | Freq: Once | SUBCUTANEOUS | Status: AC
Start: 2023-12-22 — End: 2023-12-22
  Administered 2023-12-22: 6 mg via SUBCUTANEOUS
  Filled 2023-12-22: qty 0.6

## 2023-12-22 NOTE — Patient Instructions (Signed)

## 2023-12-22 NOTE — Progress Notes (Signed)
 [facility] CSW Progress Note  Clinical Child Psychotherapist contacted patient by phone per referral from nurse navigator to assess psychosocial needs.  Patient stated she received her first chemo treatment last Friday and a shot today.  She reported some fatigue, but is doing fine overall.  She discussed her treatment and has a good understanding of what to expect.  She is very pro-active in her care.  She had some questions for the dietician, Cyndi Dinger.  CSW sent her a message requesting a call to patient.  Her children remain supportive.    Macario CHRISTELLA Au, LCSW Clinical Social Worker [facility]

## 2023-12-23 ENCOUNTER — Inpatient Hospital Stay: Payer: Medicare Other | Admitting: Dietician

## 2023-12-23 NOTE — Progress Notes (Signed)
 Nutrition Assessment: Reached out to patient at home telephone number.    Reason for Assessment: New Patient Assessment and referral from LCSW   ASSESSMENT: Patient is a 78 year old postmenopausal white female.  She has a second breast cancer.  The first breast cancer was about 32 years ago.  She underwent chemotherapy and radiation after her lumpectomy.  She is now s/p left mastectomy on 10/21/23.  She is undergoing Taxotere /Cytoxan  q 21 days and is followed by Dr. Timmy.  Her PMHx also includes DM2, CKD, HTN, GERD, HLD, and Anemia. She reports stress levels very high as spouse has been very sick as well and undergoing chemo.  They reside in a retirement community where meals are provided in a variety of settings.  She requested consult to address questions regarding elevation in blood sugars during chemo, baking soda swishes, masking recommendations, thoughts about eating from buffet if they are early to service, fluid needs and how to incorporate small frequent feeds. Spouse is a pharmacist so she was asking him how to manage glucose elevations she sees with her CGM. Usual intake: 9 am Breakfast egg bacon,  Trying to do some fruit, peanut butter toast 4-5 pm. Dinner provided by her community Fluids: aims for 70 oz. Milk 1 glass, hot teas, decaf coffee, water , Liquid IV QD, diet sodas   Nutrition Focused Physical Exam: unable to perform NFPE   Medications: Trulicity, Jardiance, Mg, Iron, Vit B12, Fish oil, MVI, Ca+   Labs: 12/18/22 Na+ 133, BUN 38, Creat 1.83, GFR 28, WBC 16, Glucose 242   Anthropometrics: 10-12# weight loss between October-November  Height: 64 Weight: 233# UBW: 227-236# BMI: 39.99   Estimated Energy Needs  Kcals: 2200 Protein: 85-106 Fluid: 3 L    NUTRITION DIAGNOSIS: Food and Nutrition Related Knowledge Deficit related to cancer and associated treatments as evidenced by request for nutrition related information.     INTERVENTION:   Relayed that  nutrition services are wrap around service provided at no charge and encouraged continued communication if experiencing any nutritional impact symptoms (NIS). Educated on importance of adequate nourishment with calorie and protein energy intake with nutrient dense foods when possible to maintain weight/strength and QOL.   Discussed ways to add calories/protein to foods (adding cheese, cooking with butter, creamy sauces/gravy)  Encouraged high protein food snack between current 2 meals -  Suggested low carb oral nutrition supplements (Glucerna or Ensure Max) Discussed strategies for controlling rise in blood sugar with dexamethasone . Encouraged her to reach out to Contact information if addition concerns or having any NIS that inhibits weight maintenance during treatment.  MONITORING, EVALUATION, GOAL: weight trends, nutrition impact symptoms, PO intake, labs   Next Visit: PRN at patient or provider request.  Micheline Craven, RDN, LDN Registered Dietitian, [facility] Part Time Remote (Usual office hours: Tuesday-Thursday) Mobile: [phone]

## 2024-01-09 ENCOUNTER — Other Ambulatory Visit: Payer: Self-pay | Admitting: *Deleted

## 2024-01-09 ENCOUNTER — Encounter: Payer: Self-pay | Admitting: *Deleted

## 2024-01-09 ENCOUNTER — Inpatient Hospital Stay (HOSPITAL_BASED_OUTPATIENT_CLINIC_OR_DEPARTMENT_OTHER): Payer: Medicare Other | Admitting: Hematology & Oncology

## 2024-01-09 ENCOUNTER — Inpatient Hospital Stay: Payer: Medicare Other

## 2024-01-09 ENCOUNTER — Encounter: Payer: Self-pay | Admitting: Hematology & Oncology

## 2024-01-09 VITALS — BP 130/66 | HR 89 | Temp 98.7°F | Resp 18 | Ht 64.0 in | Wt 229.0 lb

## 2024-01-09 VITALS — BP 114/62 | HR 88 | Resp 19

## 2024-01-09 DIAGNOSIS — C50011 Malignant neoplasm of nipple and areola, right female breast: Secondary | ICD-10-CM

## 2024-01-09 DIAGNOSIS — Z95828 Presence of other vascular implants and grafts: Secondary | ICD-10-CM

## 2024-01-09 DIAGNOSIS — Z5111 Encounter for antineoplastic chemotherapy: Secondary | ICD-10-CM | POA: Diagnosis not present

## 2024-01-09 DIAGNOSIS — Z853 Personal history of malignant neoplasm of breast: Secondary | ICD-10-CM

## 2024-01-09 LAB — CBC WITH DIFFERENTIAL (CANCER CENTER ONLY)
Abs Immature Granulocytes: 0.07 10*3/uL (ref 0.00–0.07)
Basophils Absolute: 0 10*3/uL (ref 0.0–0.1)
Basophils Relative: 0 %
Eosinophils Absolute: 0 10*3/uL (ref 0.0–0.5)
Eosinophils Relative: 0 %
HCT: 29.3 % — ABNORMAL LOW (ref 36.0–46.0)
Hemoglobin: 10 g/dL — ABNORMAL LOW (ref 12.0–15.0)
Immature Granulocytes: 1 %
Lymphocytes Relative: 6 %
Lymphs Abs: 0.6 10*3/uL — ABNORMAL LOW (ref 0.7–4.0)
MCH: 33.7 pg (ref 26.0–34.0)
MCHC: 34.1 g/dL (ref 30.0–36.0)
MCV: 98.7 fL (ref 80.0–100.0)
Monocytes Absolute: 0.3 10*3/uL (ref 0.1–1.0)
Monocytes Relative: 4 %
Neutro Abs: 8.7 10*3/uL — ABNORMAL HIGH (ref 1.7–7.7)
Neutrophils Relative %: 89 %
Platelet Count: 325 10*3/uL (ref 150–400)
RBC: 2.97 MIL/uL — ABNORMAL LOW (ref 3.87–5.11)
RDW: 13.6 % (ref 11.5–15.5)
WBC Count: 9.7 10*3/uL (ref 4.0–10.5)
nRBC: 0 % (ref 0.0–0.2)

## 2024-01-09 LAB — CMP (CANCER CENTER ONLY)
ALT: 15 U/L (ref 0–44)
AST: 15 U/L (ref 15–41)
Albumin: 4.3 g/dL (ref 3.5–5.0)
Alkaline Phosphatase: 69 U/L (ref 38–126)
Anion gap: 9 (ref 5–15)
BUN: 51 mg/dL — ABNORMAL HIGH (ref 8–23)
CO2: 22 mmol/L (ref 22–32)
Calcium: 9.6 mg/dL (ref 8.9–10.3)
Chloride: 103 mmol/L (ref 98–111)
Creatinine: 1.67 mg/dL — ABNORMAL HIGH (ref 0.44–1.00)
GFR, Estimated: 31 mL/min — ABNORMAL LOW (ref >60)
Glucose, Bld: 198 mg/dL — ABNORMAL HIGH (ref 70–99)
Potassium: 4.7 mmol/L (ref 3.5–5.1)
Sodium: 134 mmol/L — ABNORMAL LOW (ref 135–145)
Total Bilirubin: 0.5 mg/dL (ref 0.0–1.2)
Total Protein: 6.5 g/dL (ref 6.5–8.1)

## 2024-01-09 LAB — MAGNESIUM: Magnesium: 2.1 mg/dL (ref 1.7–2.4)

## 2024-01-09 MED ORDER — SODIUM CHLORIDE 0.9 % IV SOLN
600.0000 mg/m2 | Freq: Once | INTRAVENOUS | Status: AC
  Administered 2024-01-09: 1300 mg via INTRAVENOUS
  Filled 2024-01-09: qty 65

## 2024-01-09 MED ORDER — PALONOSETRON HCL INJECTION 0.25 MG/5ML
0.2500 mg | Freq: Once | INTRAVENOUS | Status: AC
  Administered 2024-01-09: 0.25 mg via INTRAVENOUS

## 2024-01-09 MED ORDER — SODIUM CHLORIDE 0.9 % IV SOLN
67.5000 mg/m2 | Freq: Once | INTRAVENOUS | Status: AC
  Administered 2024-01-09: 160 mg via INTRAVENOUS
  Filled 2024-01-09: qty 16

## 2024-01-09 MED ORDER — SODIUM CHLORIDE 0.9 % IV SOLN: INTRAVENOUS | Status: DC

## 2024-01-09 MED ORDER — HEPARIN SOD (PORK) LOCK FLUSH 100 UNIT/ML IV SOLN
500.0000 [IU] | Freq: Once | INTRAVENOUS | Status: AC
Start: 2024-01-09 — End: 2024-01-09
  Administered 2024-01-09: 500 [IU] via INTRAVENOUS

## 2024-01-09 MED ORDER — DEXAMETHASONE SODIUM PHOSPHATE 10 MG/ML IJ SOLN
10.0000 mg | Freq: Once | INTRAMUSCULAR | Status: AC
Start: 2024-01-09 — End: 2024-01-09
  Administered 2024-01-09: 10 mg via INTRAVENOUS

## 2024-01-09 MED ORDER — SODIUM CHLORIDE 0.9% FLUSH
10.0000 mL | Freq: Once | INTRAVENOUS | Status: AC
Start: 2024-01-09 — End: 2024-01-09
  Administered 2024-01-09: 10 mL via INTRAVENOUS

## 2024-01-09 NOTE — Progress Notes (Signed)
This nurse faxed a prescription for a wig to [facility] per patient request. Fax # is [phone].

## 2024-01-09 NOTE — Patient Instructions (Signed)
[facility] - A DEPT OF [facility]  Discharge Instructions: Thank you for choosing [facility] to provide your oncology and hematology care.   If you have a lab appointment with the Cancer Center, please go directly to the Cancer Center and check in at the registration area.  Wear comfortable clothing and clothing appropriate for easy access to any Portacath or PICC line.   We strive to give you quality time with your provider. You may need to reschedule your appointment if you arrive late (15 or more minutes).  Arriving late affects you and other patients whose appointments are after yours.  Also, if you miss three or more appointments without notifying the office, you may be dismissed from the clinic at the provider's discretion.      For prescription refill requests, have your pharmacy contact our office and allow 72 hours for refills to be completed.    Today you received the following chemotherapy and/or immunotherapy agents taxotere, cytoxan      To help prevent nausea and vomiting after your treatment, we encourage you to take your nausea medication as directed.  BELOW ARE SYMPTOMS THAT SHOULD BE REPORTED IMMEDIATELY: *FEVER GREATER THAN 100.4 F (38 C) OR HIGHER *CHILLS OR SWEATING *NAUSEA AND VOMITING THAT IS NOT CONTROLLED WITH YOUR NAUSEA MEDICATION *UNUSUAL SHORTNESS OF BREATH *UNUSUAL BRUISING OR BLEEDING *URINARY PROBLEMS (pain or burning when urinating, or frequent urination) *BOWEL PROBLEMS (unusual diarrhea, constipation, pain near the anus) TENDERNESS IN MOUTH AND THROAT WITH OR WITHOUT PRESENCE OF ULCERS (sore throat, sores in mouth, or a toothache) UNUSUAL RASH, SWELLING OR PAIN  UNUSUAL VAGINAL DISCHARGE OR ITCHING   Items with * indicate a potential emergency and should be followed up as soon as possible or go to the Emergency Department if any problems should occur.  Please show the CHEMOTHERAPY ALERT CARD or  IMMUNOTHERAPY ALERT CARD at check-in to the Emergency Department and triage nurse. Should you have questions after your visit or need to cancel or reschedule your appointment, please contact [facility] - A DEPT OF [facility]  [phone] and follow the prompts.  Office hours are 8:00 a.m. to 4:30 p.m. Monday - Friday. Please note that voicemails left after 4:00 p.m. may not be returned until the following business day.  We are closed weekends and major holidays. You have access to a nurse at all times for urgent questions. Please call the main number to the clinic [phone] and follow the prompts.  For any non-urgent questions, you may also contact your provider using MyChart. We now offer e-Visits for anyone 54 and older to request care online for non-urgent symptoms. For details visit [email or web address].   Also download the MyChart app! Go to the app store, search "MyChart", open the app, select [facility], and log in with your MyChart username and password.

## 2024-01-09 NOTE — Progress Notes (Signed)
Okay to treat today with SCr 1.67. Parameters in careplan changed to okay for SCr < 2 per Dr. Gustavo Lah instructions.

## 2024-01-09 NOTE — Progress Notes (Signed)
Hematology and Oncology Follow Up Visit  Kristin [name] [id number] 04-Jun-1946 78 y.o. 01/09/2024   Principle Diagnosis:  Stage IB (T2N0M0) invasive ductal carcinoma of the right breast-   ER+/PR+/HER2- --  Oncotype = 39  Current Therapy:   Status post mastectomy on 10/21/2023 Taxotere/Cytoxan-s/p cycle #1/4  -- start on 12/17/2023     Interim History:  Kristin [name] is back for follow-up.  She notes she tolerated her first cycle of chemotherapy quite nicely.  She had no nausea or vomiting.  She had no fever.  We do have her on antibiotic prophylaxis.  She has had no problems with diarrhea.  She may have had a little bit of loose stool but this is resolved.  She has had no rashes.  There is been no bleeding.  She has had no cough or shortness of breath.  She has had no mouth sores.  There is been no headache.  Unfortunately, she is lost all of her hair.  I really hate this for her.  Her blood sugars have been on the higher side.  I am sure this is from the Decadron that we give for the medication.  Currently, I would say that her performance status is probably ECOG 1.  .    Medications:  Current Outpatient Medications:    ALPRAZolam (XANAX) 0.25 MG tablet, Take 0.25 mg by mouth as needed for anxiety., Disp: , Rfl:    aspirin EC 81 MG tablet, Take 81 mg by mouth every other day. In the morning Swallow whole., Disp: , Rfl:    atorvastatin (LIPITOR) 40 MG tablet, Take 40 mg by mouth at bedtime., Disp: , Rfl:    calcium carbonate (CALCIUM 600) 600 MG TABS tablet, Take 600 mg by mouth 2 (two) times daily with a meal., Disp: , Rfl:    colestipol (COLESTID) 1 g tablet, Take 1 g by mouth daily as needed (diarrhea)., Disp: , Rfl:    cyanocobalamin (VITAMIN B12) 1000 MCG tablet, Take 1,000 mcg by mouth in the morning., Disp: , Rfl:    dexamethasone (DECADRON) 4 MG tablet, Take 2 tabs by mouth 2 times daily starting day before chemo. Then take 2 tabs daily for 2 days starting day after chemo.  Take with food., Disp: 30 tablet, Rfl: 1   famotidine (PEPCID) 20 MG tablet, Take 20 mg by mouth 2 (two) times daily., Disp: , Rfl:    ferrous sulfate 325 (65 FE) MG tablet, Take 325 mg by mouth in the morning and at bedtime., Disp: , Rfl:    fluticasone (FLONASE) 50 MCG/ACT nasal spray, Place 1 spray into both nostrils daily as needed for allergies or rhinitis., Disp: , Rfl:    furosemide (LASIX) 20 MG tablet, Take 10 mg by mouth daily as needed for edema., Disp: , Rfl:    gabapentin (NEURONTIN) 300 MG capsule, Take 600 mg by mouth at bedtime., Disp: , Rfl:    hydrALAZINE (APRESOLINE) 25 MG tablet, Take 75 mg by mouth in the morning and at bedtime., Disp: , Rfl:    isosorbide mononitrate (IMDUR) 30 MG 24 hr tablet, Take 30 mg by mouth in the morning., Disp: , Rfl:    JARDIANCE 10 MG TABS tablet, Take 10 mg by mouth in the morning., Disp: , Rfl:    levofloxacin (LEVAQUIN) 250 MG tablet, Take 1 tablet (250 mg total) by mouth daily., Disp: 15 tablet, Rfl: 4   lidocaine-prilocaine (EMLA) cream, Apply a dime size of cream to port-a-cath 1-2 hours prior  to access. Cover with [employer]., Disp: 30 g, Rfl: 3   lisinopril (ZESTRIL) 20 MG tablet, Take 20 mg by mouth in the morning., Disp: , Rfl:    MAGNESIUM GLYCINATE PO, Take 300 mg by mouth in the morning and at bedtime., Disp: , Rfl:    Melatonin 10 MG TABS, Take 10 mg by mouth at bedtime as needed (sleep)., Disp: , Rfl:    metoprolol succinate (TOPROL-XL) 50 MG 24 hr tablet, Take 50 mg by mouth 2 (two) times daily., Disp: , Rfl:    Multiple Vitamin (MULTIVITAMIN WITH MINERALS) TABS tablet, Take 1 tablet by mouth in the morning., Disp: , Rfl:    niacin (SLO-NIACIN) 500 MG tablet, Take 500 mg by mouth in the morning and at bedtime., Disp: , Rfl:    Omega-3 Fatty Acids (FISH OIL) 1000 MG CAPS, Take 1,000 mg by mouth at bedtime., Disp: , Rfl:    ondansetron (ZOFRAN) 8 MG tablet, Take 1 tablet (8 mg total) by mouth every 8 (eight) hours as needed for nausea  or vomiting. Start on the third day after chemotherapy., Disp: 30 tablet, Rfl: 1   Polyethyl Glycol-Propyl Glycol (SYSTANE) 0.4-0.3 % SOLN, Place 1 drop into both eyes 3 (three) times daily as needed (dry/irritated eyes.)., Disp: , Rfl:    prochlorperazine (COMPAZINE) 10 MG tablet, Take 1 tablet (10 mg total) by mouth every 6 (six) hours as needed for nausea or vomiting., Disp: 30 tablet, Rfl: 1   SYNTHROID 75 MCG tablet, Take 75 mcg by mouth daily before breakfast., Disp: , Rfl:    TRULICITY 4.5 MG/0.5ML SOPN, Inject 4.5 mg into the skin every Monday., Disp: , Rfl:    Vitamin D-Vitamin K (VITAMIN K2-VITAMIN D3 PO), Take 1 tablet by mouth 2 (two) times a week., Disp: , Rfl:    zolpidem (AMBIEN CR) 12.5 MG CR tablet, Take 12.5 mg by mouth at bedtime as needed for sleep., Disp: , Rfl:   Allergies:  Allergies  Allergen Reactions   Ibuprofen Other (See Comments)    Does not take due to kidney function    Levodopa Other (See Comments)    Increase of dopamine levels   Oxycodone Other (See Comments)    Pt cannot tolerate oxycodone- makes her "loopy"/ AMS    Past Medical History, Surgical history, Social history, and Family History were reviewed and updated.  Review of Systems: Review of Systems  Constitutional: Negative.   HENT:  Negative.    Eyes: Negative.   Respiratory: Negative.    Cardiovascular: Negative.   Gastrointestinal: Negative.   Endocrine: Negative.   Genitourinary: Negative.    Musculoskeletal: Negative.   Skin: Negative.   Neurological: Negative.   Hematological: Negative.   Psychiatric/Behavioral: Negative.      Physical Exam: Vital signs are temperature of 98.7.  Pulse 89.  Blood pressure 130/66.  Weight is 229 pounds.    Wt Readings from Last 3 Encounters:  01/09/24 229 lb (103.9 kg)  12/19/23 233 lb (105.7 kg)  12/16/23 227 lb (103 kg)    Physical Exam Vitals reviewed.  Constitutional:      Comments: Her breast exam shows the right mastectomy.  This is  healing.  She has no erythema or warmth.  There is no skin breakdown.  There is no right axillary adenopathy.  Left breast shows lumpectomy scar at the 12 o'clock position.  There is no mass in the left breast.  There is no double discharge.  There is no left axillary adenopathy.  HENT:     Head: Normocephalic and atraumatic.  Eyes:     Pupils: Pupils are equal, round, and reactive to light.  Cardiovascular:     Rate and Rhythm: Normal rate and regular rhythm.     Heart sounds: Normal heart sounds.  Pulmonary:     Effort: Pulmonary effort is normal.     Breath sounds: Normal breath sounds.  Abdominal:     General: Bowel sounds are normal.     Palpations: Abdomen is soft.  Musculoskeletal:        General: No tenderness or deformity. Normal range of motion.     Cervical back: Normal range of motion.  Lymphadenopathy:     Cervical: No cervical adenopathy.  Skin:    General: Skin is warm and dry.     Findings: No erythema or rash.  Neurological:     Mental Status: She is alert and oriented to person, place, and time.  Psychiatric:        Behavior: Behavior normal.        Thought Content: Thought content normal.        Judgment: Judgment normal.      Lab Results  Component Value Date   WBC 9.7 01/09/2024   HGB 10.0 (L) 01/09/2024   HCT 29.3 (L) 01/09/2024   MCV 98.7 01/09/2024   PLT 325 01/09/2024     Chemistry      Component Value Date/Time   NA 133 (L) 12/19/2023 0827   K 4.7 12/19/2023 0827   CL 102 12/19/2023 0827   CO2 22 12/19/2023 0827   BUN 38 (H) 12/19/2023 0827   CREATININE 1.83 (H) 12/19/2023 0827      Component Value Date/Time   CALCIUM 9.6 12/19/2023 0827   ALKPHOS 71 12/19/2023 0827   AST 15 12/19/2023 0827   ALT 8 12/19/2023 0827   BILITOT 0.4 12/19/2023 0827      Impression and Plan: Ms. Hizer is a very nice 78 year old postmenopausal white female.  She has a second breast cancer.  The first breast cancer was about 32 years ago.  She underwent  chemotherapy and radiation after her lumpectomy.  She now has a second breast cancer.  She had a mastectomy.  She has a high Oncotype score.  As such, we are giving her adjuvant chemotherapy.  We will go ahead with a second cycle of Taxotere/Cytoxan.  She will get a total of 4 cycles..  She will also get her G-CSF.  We will plan to get her back in 3 more weeks for her third cycle.  As always, she is showing a lot of strength.  I am not surprised by this.    Josph Macho, MD 1/31/20259:14 AM

## 2024-01-09 NOTE — Progress Notes (Unsigned)
Patient tolerated cycle one without significant side effects. She will proceed with cycle two today.   Oncology Nurse Navigator Documentation     01/09/2024    9:30 AM  Oncology Nurse Navigator Flowsheets  Navigator Follow Up Date: 01/30/2024  Navigator Follow Up Reason: Follow-up Appointment;Chemotherapy  Navigator Radiographer, therapeutic Encounter Type Treatment  Patient Visit Type MedOnc  Treatment Phase Active Tx  Barriers/Navigation Needs Coordination of Care;Education  Interventions Psycho-Social Support  Acuity Level 2-Minimal Needs (1-2 Barriers Identified)  Support Groups/Services Friends and Family  Time Spent with Patient 15

## 2024-01-12 ENCOUNTER — Inpatient Hospital Stay: Payer: Medicare Other | Attending: Hematology & Oncology

## 2024-01-12 ENCOUNTER — Encounter: Payer: Self-pay | Admitting: Hematology & Oncology

## 2024-01-12 VITALS — BP 134/79 | HR 82 | Temp 98.0°F | Resp 20

## 2024-01-12 DIAGNOSIS — Z1732 Human epidermal growth factor receptor 2 negative status: Secondary | ICD-10-CM | POA: Insufficient documentation

## 2024-01-12 DIAGNOSIS — Z170 Estrogen receptor positive status [ER+]: Secondary | ICD-10-CM | POA: Insufficient documentation

## 2024-01-12 DIAGNOSIS — C50911 Malignant neoplasm of unspecified site of right female breast: Secondary | ICD-10-CM | POA: Diagnosis present

## 2024-01-12 DIAGNOSIS — Z5111 Encounter for antineoplastic chemotherapy: Secondary | ICD-10-CM | POA: Diagnosis present

## 2024-01-12 DIAGNOSIS — Z9011 Acquired absence of right breast and nipple: Secondary | ICD-10-CM | POA: Diagnosis not present

## 2024-01-12 DIAGNOSIS — Z79899 Other long term (current) drug therapy: Secondary | ICD-10-CM | POA: Insufficient documentation

## 2024-01-12 DIAGNOSIS — Z5189 Encounter for other specified aftercare: Secondary | ICD-10-CM | POA: Insufficient documentation

## 2024-01-12 DIAGNOSIS — Z1721 Progesterone receptor positive status: Secondary | ICD-10-CM | POA: Insufficient documentation

## 2024-01-12 DIAGNOSIS — C50011 Malignant neoplasm of nipple and areola, right female breast: Secondary | ICD-10-CM

## 2024-01-12 MED ORDER — PEGFILGRASTIM-FPGK 6 MG/0.6ML ~~LOC~~ SOSY
6.0000 mg | PREFILLED_SYRINGE | Freq: Once | SUBCUTANEOUS | Status: AC
  Administered 2024-01-12: 6 mg via SUBCUTANEOUS
  Filled 2024-01-12: qty 0.6

## 2024-01-12 NOTE — Patient Instructions (Signed)

## 2024-01-13 ENCOUNTER — Encounter: Payer: Self-pay | Admitting: Hematology & Oncology

## 2024-01-13 ENCOUNTER — Other Ambulatory Visit: Payer: Self-pay

## 2024-01-20 ENCOUNTER — Inpatient Hospital Stay (HOSPITAL_BASED_OUTPATIENT_CLINIC_OR_DEPARTMENT_OTHER)
Admission: EM | Admit: 2024-01-20 | Discharge: 2024-01-26 | DRG: 193 | Disposition: A | Discharge status: Home / Self Care | Payer: Medicare Other | Attending: Family Medicine | Admitting: Family Medicine

## 2024-01-20 ENCOUNTER — Encounter (HOSPITAL_BASED_OUTPATIENT_CLINIC_OR_DEPARTMENT_OTHER): Payer: Self-pay

## 2024-01-20 ENCOUNTER — Emergency Department (HOSPITAL_BASED_OUTPATIENT_CLINIC_OR_DEPARTMENT_OTHER): Payer: Medicare Other

## 2024-01-20 ENCOUNTER — Telehealth: Payer: Self-pay

## 2024-01-20 ENCOUNTER — Other Ambulatory Visit: Payer: Self-pay

## 2024-01-20 DIAGNOSIS — E1122 Type 2 diabetes mellitus with diabetic chronic kidney disease: Secondary | ICD-10-CM | POA: Diagnosis not present

## 2024-01-20 DIAGNOSIS — G4733 Obstructive sleep apnea (adult) (pediatric): Secondary | ICD-10-CM | POA: Diagnosis present

## 2024-01-20 DIAGNOSIS — Z923 Personal history of irradiation: Secondary | ICD-10-CM

## 2024-01-20 DIAGNOSIS — E7800 Pure hypercholesterolemia, unspecified: Secondary | ICD-10-CM | POA: Diagnosis not present

## 2024-01-20 DIAGNOSIS — E860 Dehydration: Secondary | ICD-10-CM | POA: Diagnosis present

## 2024-01-20 DIAGNOSIS — D631 Anemia in chronic kidney disease: Secondary | ICD-10-CM | POA: Diagnosis present

## 2024-01-20 DIAGNOSIS — Z79899 Other long term (current) drug therapy: Secondary | ICD-10-CM

## 2024-01-20 DIAGNOSIS — Z9012 Acquired absence of left breast and nipple: Secondary | ICD-10-CM | POA: Diagnosis not present

## 2024-01-20 DIAGNOSIS — R9431 Abnormal electrocardiogram [ECG] [EKG]: Secondary | ICD-10-CM | POA: Diagnosis present

## 2024-01-20 DIAGNOSIS — Z9842 Cataract extraction status, left eye: Secondary | ICD-10-CM

## 2024-01-20 DIAGNOSIS — I130 Hypertensive heart and chronic kidney disease with heart failure and stage 1 through stage 4 chronic kidney disease, or unspecified chronic kidney disease: Secondary | ICD-10-CM | POA: Diagnosis not present

## 2024-01-20 DIAGNOSIS — Z803 Family history of malignant neoplasm of breast: Secondary | ICD-10-CM

## 2024-01-20 DIAGNOSIS — N184 Chronic kidney disease, stage 4 (severe): Secondary | ICD-10-CM | POA: Diagnosis not present

## 2024-01-20 DIAGNOSIS — I10 Essential (primary) hypertension: Secondary | ICD-10-CM | POA: Diagnosis not present

## 2024-01-20 DIAGNOSIS — E039 Hypothyroidism, unspecified: Secondary | ICD-10-CM | POA: Diagnosis present

## 2024-01-20 DIAGNOSIS — Z888 Allergy status to other drugs, medicaments and biological substances status: Secondary | ICD-10-CM

## 2024-01-20 DIAGNOSIS — D630 Anemia in neoplastic disease: Secondary | ICD-10-CM | POA: Diagnosis present

## 2024-01-20 DIAGNOSIS — E8729 Other acidosis: Secondary | ICD-10-CM | POA: Diagnosis present

## 2024-01-20 DIAGNOSIS — D84821 Immunodeficiency due to drugs: Secondary | ICD-10-CM | POA: Diagnosis present

## 2024-01-20 DIAGNOSIS — Z79890 Hormone replacement therapy (postmenopausal): Secondary | ICD-10-CM

## 2024-01-20 DIAGNOSIS — C50919 Malignant neoplasm of unspecified site of unspecified female breast: Secondary | ICD-10-CM | POA: Diagnosis present

## 2024-01-20 DIAGNOSIS — Z794 Long term (current) use of insulin: Secondary | ICD-10-CM

## 2024-01-20 DIAGNOSIS — D6481 Anemia due to antineoplastic chemotherapy: Secondary | ICD-10-CM | POA: Diagnosis present

## 2024-01-20 DIAGNOSIS — Z9049 Acquired absence of other specified parts of digestive tract: Secondary | ICD-10-CM

## 2024-01-20 DIAGNOSIS — Z950 Presence of cardiac pacemaker: Secondary | ICD-10-CM

## 2024-01-20 DIAGNOSIS — Z6838 Body mass index (BMI) 38.0-38.9, adult: Secondary | ICD-10-CM

## 2024-01-20 DIAGNOSIS — I5033 Acute on chronic diastolic (congestive) heart failure: Secondary | ICD-10-CM | POA: Diagnosis present

## 2024-01-20 DIAGNOSIS — D649 Anemia, unspecified: Secondary | ICD-10-CM | POA: Diagnosis not present

## 2024-01-20 DIAGNOSIS — C50911 Malignant neoplasm of unspecified site of right female breast: Secondary | ICD-10-CM | POA: Diagnosis not present

## 2024-01-20 DIAGNOSIS — E785 Hyperlipidemia, unspecified: Secondary | ICD-10-CM | POA: Diagnosis present

## 2024-01-20 DIAGNOSIS — I452 Bifascicular block: Secondary | ICD-10-CM | POA: Diagnosis not present

## 2024-01-20 DIAGNOSIS — Z7901 Long term (current) use of anticoagulants: Secondary | ICD-10-CM

## 2024-01-20 DIAGNOSIS — D508 Other iron deficiency anemias: Secondary | ICD-10-CM

## 2024-01-20 DIAGNOSIS — E66812 Obesity, class 2: Secondary | ICD-10-CM | POA: Diagnosis present

## 2024-01-20 DIAGNOSIS — Z7982 Long term (current) use of aspirin: Secondary | ICD-10-CM

## 2024-01-20 DIAGNOSIS — Z96612 Presence of left artificial shoulder joint: Secondary | ICD-10-CM | POA: Diagnosis present

## 2024-01-20 DIAGNOSIS — Z1152 Encounter for screening for COVID-19: Secondary | ICD-10-CM

## 2024-01-20 DIAGNOSIS — R0602 Shortness of breath: Secondary | ICD-10-CM | POA: Diagnosis present

## 2024-01-20 DIAGNOSIS — T451X5A Adverse effect of antineoplastic and immunosuppressive drugs, initial encounter: Secondary | ICD-10-CM | POA: Diagnosis present

## 2024-01-20 DIAGNOSIS — Z9841 Cataract extraction status, right eye: Secondary | ICD-10-CM

## 2024-01-20 DIAGNOSIS — N1831 Chronic kidney disease, stage 3a: Secondary | ICD-10-CM

## 2024-01-20 DIAGNOSIS — E119 Type 2 diabetes mellitus without complications: Secondary | ICD-10-CM

## 2024-01-20 DIAGNOSIS — J189 Pneumonia, unspecified organism: Secondary | ICD-10-CM | POA: Diagnosis not present

## 2024-01-20 DIAGNOSIS — Z7984 Long term (current) use of oral hypoglycemic drugs: Secondary | ICD-10-CM | POA: Diagnosis not present

## 2024-01-20 DIAGNOSIS — R197 Diarrhea, unspecified: Secondary | ICD-10-CM | POA: Diagnosis present

## 2024-01-20 DIAGNOSIS — Z96652 Presence of left artificial knee joint: Secondary | ICD-10-CM | POA: Diagnosis present

## 2024-01-20 DIAGNOSIS — E878 Other disorders of electrolyte and fluid balance, not elsewhere classified: Secondary | ICD-10-CM | POA: Diagnosis present

## 2024-01-20 DIAGNOSIS — Z885 Allergy status to narcotic agent status: Secondary | ICD-10-CM

## 2024-01-20 LAB — CBC WITH DIFFERENTIAL/PLATELET
Abs Immature Granulocytes: 1.78 10*3/uL — ABNORMAL HIGH (ref 0.00–0.07)
Basophils Absolute: 0.1 10*3/uL (ref 0.0–0.1)
Basophils Relative: 1 %
Eosinophils Absolute: 0.1 10*3/uL (ref 0.0–0.5)
Eosinophils Relative: 0 %
HCT: 26.4 % — ABNORMAL LOW (ref 36.0–46.0)
Hemoglobin: 8.8 g/dL — ABNORMAL LOW (ref 12.0–15.0)
Immature Granulocytes: 12 %
Lymphocytes Relative: 2 %
Lymphs Abs: 0.3 10*3/uL — ABNORMAL LOW (ref 0.7–4.0)
MCH: 32.7 pg (ref 26.0–34.0)
MCHC: 33.3 g/dL (ref 30.0–36.0)
MCV: 98.1 fL (ref 80.0–100.0)
Monocytes Absolute: 0.7 10*3/uL (ref 0.1–1.0)
Monocytes Relative: 5 %
Neutro Abs: 12.4 10*3/uL — ABNORMAL HIGH (ref 1.7–7.7)
Neutrophils Relative %: 80 %
Platelets: 118 10*3/uL — ABNORMAL LOW (ref 150–400)
RBC: 2.69 MIL/uL — ABNORMAL LOW (ref 3.87–5.11)
RDW: 14.6 % (ref 11.5–15.5)
Smear Review: NORMAL
WBC: 15.4 10*3/uL — ABNORMAL HIGH (ref 4.0–10.5)
nRBC: 0.1 % (ref 0.0–0.2)

## 2024-01-20 LAB — PROTIME-INR
INR: 1 (ref 0.8–1.2)
Prothrombin Time: 13.8 s (ref 11.4–15.2)

## 2024-01-20 LAB — URINALYSIS, W/ REFLEX TO CULTURE (INFECTION SUSPECTED)
Bilirubin Urine: NEGATIVE
Glucose, UA: >=500 mg/dL — AB
Hgb urine dipstick: NEGATIVE
Ketones, ur: NEGATIVE mg/dL
Leukocytes,Ua: NEGATIVE
Nitrite: NEGATIVE
Protein, ur: NEGATIVE mg/dL
Specific Gravity, Urine: 1.02 (ref 1.005–1.030)
pH: 5.5 (ref 5.0–8.0)

## 2024-01-20 LAB — MAGNESIUM: Magnesium: 1.6 mg/dL — ABNORMAL LOW (ref 1.7–2.4)

## 2024-01-20 LAB — COMPREHENSIVE METABOLIC PANEL
ALT: 25 U/L (ref 0–44)
AST: 23 U/L (ref 15–41)
Albumin: 2.8 g/dL — ABNORMAL LOW (ref 3.5–5.0)
Alkaline Phosphatase: 82 U/L (ref 38–126)
Anion gap: 9 (ref 5–15)
BUN: 43 mg/dL — ABNORMAL HIGH (ref 8–23)
CO2: 15 mmol/L — ABNORMAL LOW (ref 22–32)
Calcium: 7.3 mg/dL — ABNORMAL LOW (ref 8.9–10.3)
Chloride: 110 mmol/L (ref 98–111)
Creatinine, Ser: 1.86 mg/dL — ABNORMAL HIGH (ref 0.44–1.00)
GFR, Estimated: 27 mL/min — ABNORMAL LOW (ref >60)
Glucose, Bld: 158 mg/dL — ABNORMAL HIGH (ref 70–99)
Potassium: 4.2 mmol/L (ref 3.5–5.1)
Sodium: 134 mmol/L — ABNORMAL LOW (ref 135–145)
Total Bilirubin: 0.4 mg/dL (ref 0.0–1.2)
Total Protein: 5.4 g/dL — ABNORMAL LOW (ref 6.5–8.1)

## 2024-01-20 LAB — RESP PANEL BY RT-PCR (RSV, FLU A&B, COVID)  RVPGX2
Influenza A by PCR: NEGATIVE
Influenza B by PCR: NEGATIVE
Resp Syncytial Virus by PCR: NEGATIVE
SARS Coronavirus 2 by RT PCR: NEGATIVE

## 2024-01-20 LAB — LACTIC ACID, PLASMA: Lactic Acid, Venous: 0.7 mmol/L (ref 0.5–1.9)

## 2024-01-20 LAB — TROPONIN I (HIGH SENSITIVITY)
Troponin I (High Sensitivity): 11 ng/L (ref <18)
Troponin I (High Sensitivity): 10 ng/L (ref <18)

## 2024-01-20 LAB — BRAIN NATRIURETIC PEPTIDE: B Natriuretic Peptide: 246.7 pg/mL — ABNORMAL HIGH (ref 0.0–100.0)

## 2024-01-20 MED ORDER — ACETAMINOPHEN 650 MG RE SUPP
650.0000 mg | Freq: Four times a day (QID) | RECTAL | Status: DC | PRN
Start: 2024-01-20 — End: 2024-01-26

## 2024-01-20 MED ORDER — SODIUM CHLORIDE 0.9 % IV SOLN: 2.0000 g | INTRAVENOUS | Status: DC

## 2024-01-20 MED ORDER — SODIUM CHLORIDE 0.9 % IV SOLN
1.0000 g | Freq: Once | INTRAVENOUS | Status: AC
  Administered 2024-01-20: 1 g via INTRAVENOUS
  Filled 2024-01-20: qty 10

## 2024-01-20 MED ORDER — GUAIFENESIN 100 MG/5ML PO LIQD: 5.0000 mL | ORAL | Status: DC | PRN

## 2024-01-20 MED ORDER — ACETAMINOPHEN 325 MG PO TABS
650.0000 mg | ORAL_TABLET | Freq: Four times a day (QID) | ORAL | Status: DC | PRN
  Administered 2024-01-22: 650 mg via ORAL
  Filled 2024-01-20: qty 2

## 2024-01-20 MED ORDER — HEPARIN SODIUM (PORCINE) 5000 UNIT/ML IJ SOLN
5000.0000 [IU] | Freq: Three times a day (TID) | INTRAMUSCULAR | Status: DC
  Administered 2024-01-21 – 2024-01-26 (×16): 5000 [IU] via SUBCUTANEOUS
  Filled 2024-01-20 (×16): qty 1

## 2024-01-20 MED ORDER — ALBUTEROL SULFATE HFA 108 (90 BASE) MCG/ACT IN AERS: 2.0000 | INHALATION_SPRAY | RESPIRATORY_TRACT | Status: DC | PRN

## 2024-01-20 MED ORDER — CHLORHEXIDINE GLUCONATE CLOTH 2 % EX PADS
6.0000 | MEDICATED_PAD | Freq: Every day | CUTANEOUS | Status: DC
  Administered 2024-01-21 – 2024-01-25 (×5): 6 via TOPICAL

## 2024-01-20 MED ORDER — SODIUM CHLORIDE 0.9 % IV SOLN
100.0000 mg | Freq: Two times a day (BID) | INTRAVENOUS | Status: DC
  Administered 2024-01-21 – 2024-01-23 (×6): 100 mg via INTRAVENOUS
  Filled 2024-01-20 (×7): qty 100

## 2024-01-20 MED ORDER — SODIUM CHLORIDE 0.9 % IV SOLN
100.0000 mg | Freq: Once | INTRAVENOUS | Status: AC
  Administered 2024-01-20: 100 mg via INTRAVENOUS
  Filled 2024-01-20: qty 100

## 2024-01-20 MED ORDER — ALBUTEROL SULFATE (2.5 MG/3ML) 0.083% IN NEBU: 2.5000 mg | INHALATION_SOLUTION | Freq: Four times a day (QID) | RESPIRATORY_TRACT | Status: DC | PRN

## 2024-01-20 MED ORDER — ALBUTEROL SULFATE (2.5 MG/3ML) 0.083% IN NEBU: 2.5000 mg | INHALATION_SOLUTION | RESPIRATORY_TRACT | Status: DC | PRN

## 2024-01-20 MED ORDER — SODIUM CHLORIDE 0.9 % IV BOLUS
500.0000 mL | Freq: Once | INTRAVENOUS | Status: AC
  Administered 2024-01-20: 500 mL via INTRAVENOUS

## 2024-01-20 MED ORDER — SODIUM CHLORIDE 0.9% FLUSH
10.0000 mL | Freq: Two times a day (BID) | INTRAVENOUS | Status: DC
Start: 2024-01-21 — End: 2024-01-26
  Administered 2024-01-21 – 2024-01-26 (×10): 10 mL

## 2024-01-20 MED ORDER — SODIUM CHLORIDE 0.9% FLUSH: 10.0000 mL | INTRAVENOUS | Status: DC | PRN

## 2024-01-20 MED ORDER — MAGNESIUM SULFATE 2 GM/50ML IV SOLN
2.0000 g | Freq: Once | INTRAVENOUS | Status: AC
  Administered 2024-01-20: 2 g via INTRAVENOUS
  Filled 2024-01-20: qty 50

## 2024-01-20 MED ORDER — METOPROLOL SUCCINATE ER 50 MG PO TB24
50.0000 mg | ORAL_TABLET | Freq: Two times a day (BID) | ORAL | Status: DC
  Administered 2024-01-21 – 2024-01-26 (×12): 50 mg via ORAL
  Filled 2024-01-20 (×12): qty 1

## 2024-01-20 NOTE — ED Notes (Signed)
[facility] called for Transport No Current ETA ED Nurse will call floor to give report Called @ 20:32

## 2024-01-20 NOTE — ED Notes (Incomplete)
Fall risk sign on door Fall risk armband

## 2024-01-20 NOTE — H&P (Incomplete)
 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  She has been very weak, in bed since Wednesday because of diarrhea.  Today she tried to get to the shower, she was so weak patient brought to the ER.  She endorses low-grade fever starting today.  Positive shortness of breath no cough.  No nausea or vomiting.  Poor appetite.  Her antidiarrheal became effective and her diarrhea has decreased.  In the ER patient is hemodynamically stable but tachycardic.  Creatinine at baseline 1.86.  WBC 15.4.  Respiratory panel negative.  CXR: LLL pneumonia  Review of Systems:  Per HPI  Past Medical History: Past Medical History:  Diagnosis Date   Anemia 1992   Arthritis    oa   Breast CA (HCC) 1992   lumpectomy with chemo and radiation left breast   Chronic kidney disease stage 3    watching creatining levels sees dr nwbu   Diabetes mellitus without complication (HCC)    type 2    GERD (gastroesophageal reflux disease)    Hiatal hernia    High cholesterol    Hypertension    Hypothyroidism    Personal history of radiation therapy    Presence of permanent cardiac pacemaker    [employer]tr[employer]   Sleep apnea    Past Surgical History:  Procedure Laterality Date   BACK SURGERY     upper and lower upper back has clamp   BICEPT TENODESIS Left 05/27/2023   Procedure: BICEPS TENODESIS;  Surgeon: Cammy Copa, MD;  [facility] OR;  Service: Orthopedics;  Laterality: Left;   BREAST LUMPECTOMY Left    and lymph nodes removed (16-18)   CARDIAC CATHETERIZATION  01/29/2022   CATARACT EXTRACTION Bilateral    CHOLECYSTECTOMY     COLONOSCOPY  10/2022   PORTACATH PLACEMENT N/A 12/16/2023   Procedure: PORT PLACEMENT WITH ULTRASOUND  GUIDANCE;  Surgeon: [name], MD;  Location: [facility] OR;  Service: General;  Laterality: N/A;   REVERSE SHOULDER ARTHROPLASTY Left 05/27/2023   Procedure: LEFT REVERSE SHOULDER ARTHROPLASTY;  Surgeon: Cammy Copa, MD;  Location: [facility] OR;  Service: Orthopedics;  Laterality: Left;   SIMPLE MASTECTOMY WITH AXILLARY SENTINEL NODE BIOPSY Left 10/21/2023   Procedure: LEFT MASTECTOMY;  Surgeon: [name], MD;  Location: [facility] OR;  Service: General;  Laterality: Left;  90 MINUTES LMA PEC BLOCK   TONSILLECTOMY     TOTAL KNEE ARTHROPLASTY Left 09/12/2017   Procedure: LEFT TOTAL KNEE ARTHROPLASTY;  Surgeon: Kathryne Hitch, MD;  Location: [facility] ORS;  Service: Orthopedics;  Laterality: Left;   TUBAL LIGATION      Medications: Prior to Admission medications   Medication Sig Start Date End Date Taking? Authorizing Provider  ALPRAZolam Prudy Feeler) 0.25 MG tablet Take 0.25 mg by mouth as needed for anxiety. 08/07/23   [provider]  aspirin EC 81 MG tablet Take 81 mg by mouth every other day. In the morning Swallow whole.    [provider]  atorvastatin (LIPITOR) 40 MG tablet Take 40 mg by mouth at bedtime.    [provider]  calcium carbonate (CALCIUM 600) 600 MG TABS tablet Take 600 mg by mouth 2 (two) times daily with a meal.    [provider]  colestipol (COLESTID) 1 g tablet Take 1 g by mouth daily as needed (diarrhea). 09/06/22   [provider]  cyanocobalamin (VITAMIN B12) 1000 MCG tablet Take 1,000 mcg by mouth in the morning.    [provider]  dexamethasone (DECADRON) 4 MG tablet Take 2 tabs by mouth 2 times daily starting day before chemo. Then take 2 tabs daily for 2 days starting day after chemo. Take with food. 11/24/23   Josph Macho, MD  famotidine (PEPCID) 20 MG tablet Take 20 mg by mouth 2 (two) times daily.    [provider]  ferrous sulfate 325 (65 FE) MG tablet Take 325 mg by mouth in the morning  and at bedtime.    [provider]  fluticasone (FLONASE) 50 MCG/ACT nasal spray Place 1 spray into both nostrils daily as needed for allergies or rhinitis.    [provider]  furosemide (LASIX) 20 MG tablet Take 10 mg by mouth daily as needed for edema.    [provider]  gabapentin (NEURONTIN) 300 MG capsule Take 600 mg by mouth at bedtime. 04/06/20   [provider]  hydrALAZINE (APRESOLINE) 25 MG tablet Take 75 mg by mouth in the morning and at bedtime. 12/25/18   [provider]  isosorbide mononitrate (IMDUR) 30 MG 24 hr tablet Take 30 mg by mouth in the morning. 08/21/22   [provider]  JARDIANCE 10 MG TABS tablet Take 10 mg by mouth in the morning. 07/30/22   [provider]  levofloxacin (LEVAQUIN) 250 MG tablet Take 1 tablet (250 mg total) by mouth daily. 12/19/23   Josph Macho, MD  lidocaine-prilocaine (EMLA) cream Apply a dime size of cream to port-a-cath 1-2 hours prior to access. Cover with [employer]. 12/19/23   Josph Macho, MD  lisinopril (ZESTRIL) 20 MG tablet Take 20 mg by mouth in the morning.    [provider]  MAGNESIUM GLYCINATE PO Take 300 mg by mouth in the morning and at bedtime.    [provider]  Melatonin 10 MG TABS Take 10 mg by mouth at bedtime as needed (sleep).    [provider]  metoprolol succinate (TOPROL-XL) 50 MG 24 hr tablet Take 50 mg by mouth 2 (two) times daily. 06/22/22   [provider]  Multiple Vitamin (MULTIVITAMIN WITH MINERALS) TABS tablet Take 1 tablet by mouth in the morning.    [provider]  niacin (SLO-NIACIN) 500 MG tablet Take 500 mg by mouth in the morning and at bedtime. 12/28/19   [provider]  Omega-3 Fatty Acids (FISH OIL) 1000 MG CAPS Take 1,000 mg by mouth at bedtime.    [provider]  ondansetron (ZOFRAN) 8 MG tablet Take 1 tablet (8 mg total) by mouth every 8 (eight) hours as needed for nausea or  vomiting. Start on the third day after chemotherapy. 11/24/23   Josph Macho, MD  Polyethyl Glycol-Propyl Glycol (SYSTANE) 0.4-0.3 % SOLN Place 1 drop into both eyes 3 (three) times daily as needed (dry/irritated eyes.).    [provider]  prochlorperazine (COMPAZINE) 10 MG tablet Take 1 tablet (10 mg total) by mouth every 6 (six) hours as needed for nausea or vomiting. 11/24/23   Josph Macho, MD  SYNTHROID 75 MCG tablet Take 75 mcg by mouth daily before breakfast. 04/07/20   [provider]  TRULICITY 4.5 MG/0.5ML SOPN Inject 4.5 mg into the skin every Monday. 09/08/23   [provider]  Vitamin D-Vitamin K (VITAMIN K2-VITAMIN D3 PO) Take 1 tablet by mouth 2 (two) times a week. 08/10/23   [provider]  zolpidem (AMBIEN CR) 12.5 MG CR tablet Take 12.5 mg by mouth at bedtime as needed for sleep. 01/06/19   [provider]    Allergies:   Allergies  Allergen Reactions   Ibuprofen Other (See Comments)    Does not take due to kidney function    Levodopa Other (See Comments)    Increase of dopamine levels   Oxycodone Other (See Comments)    Pt cannot tolerate oxycodone- makes her "loopy"/ AMS    Social History:  reports that she has never smoked. She has never used smokeless tobacco. She reports that she does not currently use alcohol. She reports that she does not use drugs.  Family History: Family History  Problem Relation Age of Onset   Breast cancer Mother 57       metastatic   Cancer Maternal Uncle 43 - 28       unknown type   Breast cancer Cousin 54 - 84       maternal first cousin   Breast cancer Cousin 75       maternal first cousin    Physical Exam: Vitals:   01/20/24 2030 01/20/24 2045 01/20/24 2100 01/20/24 2203  BP: (!) 148/59 (!) 147/64 132/69 (!) 151/85  Pulse: (!) 114 (!) 114 (!) 116 (!) 116  Resp: (!) 26 (!) 27 (!) 24 (!) 25  Temp:    99.5 F (37.5 C)  TempSrc:    Oral  SpO2: (!) 89% (!) 89% 90% 95%   Weight:        General:  A&Ox3, morbidly obese, pale, weak, no acute distress Eyes: Pale conjunctiva, no scleral icterus ENT: Moist oral mucosa, neck supple, no thyromegaly Lungs: clear, no wheeze, no crackles, no use of accessory muscles Cardiovascular: Tachycardia, RRR, no murmurs no JVD.  Port left chest wall Abdomen: soft, positive BS, non-tender, obese abdomen, NTND  GU: not examined Neuro: CN II - XII grossly intact, sensation intact Musculoskeletal: Moves all extremities, no clubbing, cyanosis or edema Skin: no rash, no subcutaneous crepitation, no decubitus Psych: appropriate patient  Labs on Admission:  Recent Labs    01/20/24 1350 01/20/24 1410  NA 134*  --   K 4.2  --   CL 110  --   CO2 15*  --   GLUCOSE 158*  --   BUN 43*  --   CREATININE 1.86*  --   CALCIUM 7.3*  --   MG  --  1.6*   Recent Labs    01/20/24 1350  AST 23  ALT 25  ALKPHOS 82  BILITOT 0.4  PROT 5.4*  ALBUMIN 2.8*    Recent Labs    01/20/24 1350  WBC 15.4*  NEUTROABS 12.4*  HGB 8.8*  HCT 26.4*  MCV 98.1  PLT 118*    Micro Results: Recent Results (from the past 240 hours)  Resp panel by RT-PCR (RSV, Flu A&B, Covid) Anterior Nasal Swab     Status: None   Collection Time: 01/20/24  1:02 PM   Specimen: Anterior Nasal Swab  Result Value Ref Range Status   SARS Coronavirus 2 by RT PCR NEGATIVE NEGATIVE Final    Comment: (NOTE) SARS-CoV-2 target nucleic acids are NOT DETECTED.  The SARS-CoV-2 RNA is generally detectable in upper respiratory specimens during the acute phase of infection. The lowest concentration of SARS-CoV-2 viral copies this assay  can detect is 138 copies/mL. A negative result does not preclude SARS-Cov-2 infection and should not be used as the sole basis for treatment or other patient management decisions. A negative result may occur with  improper specimen collection/handling, submission of specimen other than nasopharyngeal swab, presence of viral  mutation(s) within the areas targeted by this assay, and inadequate number of viral copies(<138 copies/mL). A negative result must be combined with clinical observations, patient history, and epidemiological information. The expected result is Negative.  Fact Sheet for Patients:  BloggerCourse.com  Fact Sheet for Healthcare Providers:  SeriousBroker.it  This test is no t yet approved or cleared by the [place] FDA and  has been authorized for detection and/or diagnosis of SARS-CoV-2 by FDA under an Emergency Use Authorization (EUA). This EUA will remain  in effect (meaning this test can be used) for the duration of the COVID-19 declaration under Section 564(b)(1) of the Act, 21 U.S.C.section 360bbb-3(b)(1), unless the authorization is terminated  or revoked sooner.       Influenza A by PCR NEGATIVE NEGATIVE Final   Influenza B by PCR NEGATIVE NEGATIVE Final    Comment: (NOTE) The Xpert Xpress SARS-CoV-2/FLU/RSV plus assay is intended as an aid in the diagnosis of influenza from Nasopharyngeal swab specimens and should not be used as a sole basis for treatment. Nasal washings and aspirates are unacceptable for Xpert Xpress SARS-CoV-2/FLU/RSV testing.  Fact Sheet for Patients: BloggerCourse.com  Fact Sheet for Healthcare Providers: SeriousBroker.it  This test is not yet approved or cleared by the [place] FDA and has been authorized for detection and/or diagnosis of SARS-CoV-2 by FDA under an Emergency Use Authorization (EUA). This EUA will remain in effect (meaning this test can be used) for the duration of the COVID-19 declaration under Section 564(b)(1) of the Act, 21 U.S.C. section 360bbb-3(b)(1), unless the authorization is terminated or revoked.     Resp Syncytial Virus by PCR NEGATIVE NEGATIVE Final    Comment: (NOTE) Fact Sheet for  Patients: BloggerCourse.com  Fact Sheet for Healthcare Providers: SeriousBroker.it  This test is not yet approved or cleared by the [place] FDA and has been authorized for detection and/or diagnosis of SARS-CoV-2 by FDA under an Emergency Use Authorization (EUA). This EUA will remain in effect (meaning this test can be used) for the duration of the COVID-19 declaration under Section 564(b)(1) of the Act, 21 U.S.C. section 360bbb-3(b)(1), unless the authorization is terminated or revoked.  Performed at [facility], [address] [place], [place] [address]      Radiological Exams on Admission: DG Chest Portable 1 View Result Date: 01/20/2024 CLINICAL DATA:  Short of breath since yesterday, fever, history of breast cancer EXAM: PORTABLE CHEST 1 VIEW COMPARISON:  11/28/2023, 01/22/2022 FINDINGS: Single frontal view of the chest demonstrates dual lead pacer overlying right chest, proximal lead over right atrium and distal lead over right ventricle. Left chest wall port via internal jugular approach, tip overlies the superior vena cava. Cardiac silhouette is stable. Hiatal hernia is again noted. Patchy airspace disease at the lung bases, most pronounced in the right lower lobe. No effusion or pneumothorax. Postsurgical changes from left mastectomy. There are no acute or destructive bony abnormalities. Unremarkable left shoulder arthroplasty. IMPRESSION: 1. Patchy bibasilar airspace disease, most pronounced in the right lower lobe, consistent with pneumonia. 2. Hiatal hernia. Electronically Signed   By: Sharlet Salina M.D.   On: 01/20/2024 15:47    Assessment/Plan Present on Admission:  Community acquired pneumonia -  Pneumonia order set ordered -Blood cultures x 2 ordered -Continue IV Rocephin and azithromycin -Oxygen if needed to keep sats greater than 88% -Nebulizes every 2 hours, Robitussin as needed -Patient on  chemotherapy, immunocompromised   Diarrhea -C. difficile, GI panel ordered -Gentle IV fluid hydration -History of precautions -Diarrhea improving -Hold Lasix   Anemia -Likely due to CKD and chemotherapy -Hemoglobin trending down from 12 =>10>8.8 -Anemia panel ordered   HLD (hyperlipidemia) -Atorvastatin   HTN (hypertension) -Metoprolol, hydralazine, Imdur with hold parameters -Hold lisinopril with dehydration tachycardia -Hold lisinopril for   Hypothyroidism -Synthroid resumed   OSA (obstructive sleep apnea) -CPAP ordered   T2DM -Sliding scale insulin. -Taken on Sunday   QT prolongation-  Breast cancer (HCC) -On chemotherapy q3wk. Recurrent breast ca -1992 lumpectomy. No nodes. Chemo/xrt. Nothing until now  Marguita Venning 01/20/2024, 11:37 PM

## 2024-01-20 NOTE — Telephone Encounter (Signed)
Patients daughter called stating patient has had diarrhea x 4 days, increased SOB and weakness and low grade fever last night. Advised patient d/t symptoms to go to ED. Pt and pt family agreed and will take her to ED for evaluation.

## 2024-01-20 NOTE — ED Notes (Signed)
Fall risk sign on door Fall risk armband Patient wearing shoes

## 2024-01-20 NOTE — ED Provider Notes (Addendum)
78 yo female w/ breast cancer on chemotherapy presenting with nausea, diarrhea since last week, reported temp 100F at home last night, also with SOB  Pt on levaquin for ppx  Physical Exam  BP 130/75 (BP Location: Right Arm)   Pulse 98   Temp 97.7 F (36.5 C) (Oral)   Resp (!) 28   Wt 102.1 kg   SpO2 95%   BMI 38.62 kg/m   Physical Exam  Procedures  Procedures  ED Course / MDM    Medical Decision Making Amount and/or Complexity of Data Reviewed Labs: ordered. Radiology: ordered.  Risk Prescription drug management. Decision regarding hospitalization.   Workup reviewed, x-ray concerning for potential left lower lobe infiltrate.  Patient is reporting that she developed shortness of breath and fever beginning last night specifically with worsening fatigue today.  I think it is reasonable to treat with antibiotics for pneumonia.  Patient is on Levaquin as prophylaxis so we will switch to IV Rocephin and doxycycline.  Blood cultures already sent.  She remains hemodynamically stable but requiring 2 L nasal cannula.  She will be admitted to the hospital.  450 pm - admitted to Hospitalist DR Reche Dixon, Kermit Balo, MD 01/20/24 1629    Terald Sleeper, MD 01/20/24 [phone]

## 2024-01-20 NOTE — ED Notes (Signed)
Accessed chest power port , difficult stick and unable to obtain blood cultures at this time

## 2024-01-20 NOTE — ED Provider Notes (Signed)
 [facility] Provider Note   CSN: [id number] Arrival date & time: 01/20/24  1250     History  Chief Complaint  Patient presents with   Shortness of Breath    Kristin [name] is a 78 y.o. 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 Last chemo 1/31  Presents with fatigue, shortness of breath, fever, and diarrhea.  Underwent chemotherapy on January 31st and received an Pegfilgrastim shot 2/3. By last Tuesday, the patient began to feel unwell, and by Wednesday, she developed nausea and diarrhea.  The patient reports having little to no energy, even for short walks from one room to another.  The patient uses a CPAP machine for sleep apnea and has been using it more frequently due to shortness of breath and laying in bed The patient also reports a fever of around 100 degrees yesterday 2/11 The fever resolved by the morning but is taking Tylenol every four hours.  She also reports feeling slightly swollen in her fingers. No vomiting, no chest pain States that she has also been on a prophylactic antibiotic since her last chemotherapy (per chart review appears to be Levaquin) States she has had a time before where after her pacemaker was placed she had a pneumothorax and had to have a chest tube last year     Home Medications Prior to Admission medications   Medication Sig Start Date End Date Taking? Authorizing Provider  ALPRAZolam Prudy Feeler) 0.25 MG tablet Take 0.25 mg by mouth as needed for anxiety. 08/07/23   [provider]  aspirin EC 81 MG tablet Take 81 mg by mouth every other day. In the morning  Swallow whole.    [provider]  atorvastatin (LIPITOR) 40 MG tablet Take 40 mg by mouth at bedtime.    [provider]  calcium carbonate (CALCIUM 600) 600 MG TABS tablet Take 600 mg by mouth 2 (two) times daily with a meal.    [provider]  colestipol (COLESTID) 1 g tablet Take 1 g by mouth daily as needed (diarrhea). 09/06/22   [provider]  cyanocobalamin (VITAMIN B12) 1000 MCG tablet Take 1,000 mcg by mouth in the morning.    [provider]  dexamethasone (DECADRON) 4 MG tablet Take 2 tabs by mouth 2 times daily starting day before chemo. Then take 2 tabs daily for 2 days starting day after chemo. Take with food. 11/24/23   Josph Macho, MD  famotidine (PEPCID) 20 MG tablet Take 20 mg by mouth 2 (two) times daily.    [provider]  ferrous sulfate 325 (65 FE) MG tablet Take 325 mg by mouth in the morning and at bedtime.    [provider]  fluticasone (FLONASE) 50 MCG/ACT nasal spray Place 1 spray into both nostrils daily as needed for allergies or rhinitis.    [provider]  furosemide (LASIX) 20 MG tablet Take 10 mg by mouth daily as needed for edema.    [provider]  gabapentin (NEURONTIN) 300 MG capsule Take 600 mg by mouth at bedtime. 04/06/20   [provider]  hydrALAZINE (APRESOLINE) 25 MG tablet Take  75 mg by mouth in the morning and at bedtime. 12/25/18   [provider]  isosorbide mononitrate (IMDUR) 30 MG 24 hr tablet Take 30 mg by mouth in the morning. 08/21/22   [provider]  JARDIANCE 10 MG TABS tablet Take 10 mg by mouth in the morning. 07/30/22   [provider]  levofloxacin (LEVAQUIN) 250 MG tablet Take 1 tablet (250 mg total) by mouth daily. 12/19/23   Josph Macho, MD  lidocaine-prilocaine (EMLA) cream Apply a dime size of cream to port-a-cath 1-2 hours prior to access. Cover with [employer]. 12/19/23   Josph Macho, MD  lisinopril  (ZESTRIL) 20 MG tablet Take 20 mg by mouth in the morning.    [provider]  MAGNESIUM GLYCINATE PO Take 300 mg by mouth in the morning and at bedtime.    [provider]  Melatonin 10 MG TABS Take 10 mg by mouth at bedtime as needed (sleep).    [provider]  metoprolol succinate (TOPROL-XL) 50 MG 24 hr tablet Take 50 mg by mouth 2 (two) times daily. 06/22/22   [provider]  Multiple Vitamin (MULTIVITAMIN WITH MINERALS) TABS tablet Take 1 tablet by mouth in the morning.    [provider]  niacin (SLO-NIACIN) 500 MG tablet Take 500 mg by mouth in the morning and at bedtime. 12/28/19   [provider]  Omega-3 Fatty Acids (FISH OIL) 1000 MG CAPS Take 1,000 mg by mouth at bedtime.    [provider]  ondansetron (ZOFRAN) 8 MG tablet Take 1 tablet (8 mg total) by mouth every 8 (eight) hours as needed for nausea or vomiting. Start on the third day after chemotherapy. 11/24/23   Josph Macho, MD  Polyethyl Glycol-Propyl Glycol (SYSTANE) 0.4-0.3 % SOLN Place 1 drop into both eyes 3 (three) times daily as needed (dry/irritated eyes.).    [provider]  prochlorperazine (COMPAZINE) 10 MG tablet Take 1 tablet (10 mg total) by mouth every 6 (six) hours as needed for nausea or vomiting. 11/24/23   Josph Macho, MD  SYNTHROID 75 MCG tablet Take 75 mcg by mouth daily before breakfast. 04/07/20   [provider]  TRULICITY 4.5 MG/0.5ML SOPN Inject 4.5 mg into the skin every Monday. 09/08/23   [provider]  Vitamin D-Vitamin K (VITAMIN K2-VITAMIN D3 PO) Take 1 tablet by mouth 2 (two) times a week. 08/10/23   [provider]  zolpidem (AMBIEN CR) 12.5 MG CR tablet Take 12.5 mg by mouth at bedtime as needed for sleep. 01/06/19   [provider]      Allergies    Ibuprofen, Levodopa, and Oxycodone    Review of Systems   Review of Systems  Constitutional:  Negative for chills and fever.  HENT:   Negative for ear pain and sore throat.   Eyes:  Negative for pain and visual disturbance.  Respiratory:  Positive for shortness of breath. Negative for cough.   Cardiovascular:  Negative for chest pain and palpitations.  Gastrointestinal:  Positive for diarrhea and nausea. Negative for abdominal pain and vomiting.  Genitourinary:  Negative for dysuria and hematuria.  Musculoskeletal:  Negative for arthralgias and back pain.  Skin:  Negative for color change and rash.  Neurological:  Negative for seizures and syncope.  All other systems reviewed and are negative.   Physical Exam Updated Vital Signs BP 130/75 (BP Location: Right Arm)   Pulse 98   Temp 97.7 F (36.5  C) (Oral)   Resp (!) 28   Wt 102.1 kg   SpO2 95%   BMI 38.62 kg/m  Physical Exam Vitals and nursing note reviewed.  Constitutional:      General: She is not in acute distress.    Appearance: She is obese. She is ill-appearing.  HENT:     Head: Normocephalic and atraumatic.     Mouth/Throat:     Mouth: Mucous membranes are moist.     Pharynx: No pharyngeal swelling or oropharyngeal exudate.  Eyes:     Conjunctiva/sclera: Conjunctivae normal.  Cardiovascular:     Rate and Rhythm: Normal rate and regular rhythm.     Heart sounds: No murmur heard. Pulmonary:     Effort: Tachypnea present. No respiratory distress.     Breath sounds: Normal breath sounds. No decreased breath sounds, wheezing, rhonchi or rales.     Comments: Mildly increased work of breathing on room air, speaking full sentences, coarse crackles in posterior lung fields Chest:     Chest wall: No tenderness.     Comments: No overlying erythema Abdominal:     Palpations: Abdomen is soft.     Tenderness: There is no abdominal tenderness.  Musculoskeletal:        General: No swelling.     Cervical back: Neck supple.     Right lower leg: No tenderness. Edema present.     Left lower leg: No tenderness. Edema present.     Comments: 1+ edema  bilaterally  Skin:    General: Skin is warm and dry.     Capillary Refill: Capillary refill takes less than 2 seconds.  Neurological:     Mental Status: She is alert.  Psychiatric:        Mood and Affect: Mood normal.     ED Results / Procedures / Treatments   Labs (all labs ordered are listed, but only abnormal results are displayed) Labs Reviewed  CBC WITH DIFFERENTIAL/PLATELET - Abnormal; Notable for the following components:      Result Value   WBC 15.4 (*)    RBC 2.69 (*)    Hemoglobin 8.8 (*)    HCT 26.4 (*)    Platelets 118 (*)    All other components within normal limits  COMPREHENSIVE METABOLIC PANEL - Abnormal; Notable for the following components:   Sodium 134 (*)    CO2 15 (*)    Glucose, Bld 158 (*)    BUN 43 (*)    Creatinine, Ser 1.86 (*)    Calcium 7.3 (*)    Total Protein 5.4 (*)    Albumin 2.8 (*)    GFR, Estimated 27 (*)    All other components within normal limits  RESP PANEL BY RT-PCR (RSV, FLU A&B, COVID)  RVPGX2  CULTURE, BLOOD (ROUTINE X 2)  CULTURE, BLOOD (ROUTINE X 2)  C DIFFICILE QUICK SCREEN W PCR REFLEX    PROTIME-INR  LACTIC ACID, PLASMA  LACTIC ACID, PLASMA  BRAIN NATRIURETIC PEPTIDE  MAGNESIUM  URINALYSIS, W/ REFLEX TO CULTURE (INFECTION SUSPECTED)    EKG EKG Interpretation Date/Time:  Tuesday January 20 2024 13:05:33 EST Ventricular Rate:  101 PR Interval:  152 QRS Duration:  193 QT Interval:  420 QTC Calculation: 545 R Axis:   258  Text Interpretation: Sinus tachycardia Multiform ventricular premature complexes Nonspecific IVCD with LAD No significant change since last tracing Confirmed by Melene Plan [phone]) on 01/20/2024 1:30:58 PM  Radiology No results found.  Procedures Procedures   Medications Ordered  in ED Medications  albuterol (VENTOLIN HFA) 108 (90 Base) MCG/ACT inhaler 2 puff (has no administration in time range)  sodium chloride 0.9 % bolus 500 mL (500 mLs Intravenous New Bag/Given 01/20/24 1438)    ED  Course/ Medical Decision Making/ A&P                               Medical Decision Making Amount and/or Complexity of Data Reviewed Labs: ordered. Radiology: ordered.  Risk Prescription drug management.   Medical Decision Making:   Shalen Petrak is a 78 y.o. female who presented to the ED today with nausea, diarrhea, dyspnea detailed above.    Additional history discussed with patient's family/caregivers.  External chart has been reviewed including oncology notes. Patient's presentation is complicated by their history of invasive ductal carcinoma recently started on chemotherapy.  Patient placed on continuous vitals and telemetry monitoring while in ED which was reviewed periodically.  Complete initial physical exam performed, notably the patient  was tachypneic although able to speak full sentences.    Reviewed and confirmed nursing documentation for past medical history, family history, social history.    Initial Assessment:   With the patient's presentation of dyspnea and diarrhea. Diagnoses were considered including (but not limited to) pneumonia, C. difficile, pneumothorax, pulmonary embolism, tumor lysis syndrome, anemia, neutropenic fever. These are considered less likely due to history of present illness and physical exam findings.   This is most consistent with an acute complicated illness  Initial Plan:  C. difficile PCR UA with reflex culture, LA, Blood culture 500 CC bolus BNP, magnesium Troponin Respiratory panel Screening labs including CBC and Metabolic panel to evaluate for infectious or metabolic etiology of disease.  Urinalysis with reflex culture ordered to evaluate for UTI or relevant urologic/nephrologic pathology.  CXR to evaluate for structural/infectious intrathoracic pathology.  EKG to evaluate for cardiac pathology Objective evaluation as below reviewed   Initial Study Results:   Laboratory  All laboratory results reviewed without evidence of  clinically relevant pathology.   Exceptions include: Mild hyponatremia to 134, creatinine around baseline at 1.86, bicarb 15, calcium 7.3, hemoglobin 8.8 (dropped from recent baseline 10), WBC 15.4, platelets 118, BNP 246.  EKG EKG was reviewed independently. Rate, rhythm, axis, intervals all examined and without medically relevant abnormality. ST segments without concerns for elevations.    Radiology:  All images reviewed independently.  Chest x-ray on my read appears to have poor inspiratory effort, no consolidations. No results found.  Final Assessment and Plan:   78 year old female with history of ductal carcinoma recently started on chemotherapy.  Did have low-grade temperature yesterday to 100.  Here has been afebrile but quite tachypneic. Differential is broad given history.   Shortness of breath-looking at chest x-ray does not appear to have acute consolidation/pneumonia.  Does not have significant lung findings on examination, possible that this is a pulmonary embolism. Wells criteria of 4 with malignancy and PE possible diagnosis.  CTPE ordered. BNP in middle range/indeterminate in 200s-chest x-ray does not look like acute pulmonary edema.  MI, does have atypical symptoms and no chest pain-will obtain troponin and EKG without ST elevations but did show sinus tachycardia.  Patient does have multiple PVCs and does have pacer in place. URI possible however viral panel negative.  Does have anemia to 8.8 but not significantly decreased.  LA reassuring at 0.7.  Diarrhea-C diff panel ordered given history of Levaquin use for the past  2 weeks and profuse diarrhea as well as fever.  Patient does not have abdominal tenderness on examination.  Given some IV hydration with LR.  WBC of 15, neutrophilic shift, granulocytes elevated but did receive G-CSF recently.  ?Fever-none here.  Not neutropenic here, WBC elevated.  Lactic acid reassuring.  Negative COVID/flu.  Possible intra-abdominal source given  diarrhea-obtaining C. difficile.  Blood cultures being obtained.  Urinalysis with culture being obtained.  Port site does not appear infected.   Signed out to oncoming EDP   Clinical Impression:  1. Shortness of breath      Data Unavailable    Final Clinical Impression(s) / ED Diagnoses Final diagnoses:  Shortness of breath    Rx / DC Orders ED Discharge Orders     None         Levin Erp, MD 01/20/24 1503    Melene Plan, DO 01/20/24 1516

## 2024-01-20 NOTE — ED Triage Notes (Addendum)
Pt reports SOB since last night. Noticed last night, but has increased today. Better at night with CPAP Fever last night 100. Last chemo jan 31. Mild cough RT in to assess pt EDP aware

## 2024-01-21 ENCOUNTER — Other Ambulatory Visit: Payer: Self-pay

## 2024-01-21 ENCOUNTER — Encounter: Payer: Self-pay | Admitting: Hematology & Oncology

## 2024-01-21 DIAGNOSIS — Z9012 Acquired absence of left breast and nipple: Secondary | ICD-10-CM | POA: Diagnosis not present

## 2024-01-21 DIAGNOSIS — D6481 Anemia due to antineoplastic chemotherapy: Secondary | ICD-10-CM | POA: Diagnosis present

## 2024-01-21 DIAGNOSIS — D630 Anemia in neoplastic disease: Secondary | ICD-10-CM | POA: Diagnosis present

## 2024-01-21 DIAGNOSIS — Z96612 Presence of left artificial shoulder joint: Secondary | ICD-10-CM | POA: Diagnosis present

## 2024-01-21 DIAGNOSIS — E1122 Type 2 diabetes mellitus with diabetic chronic kidney disease: Secondary | ICD-10-CM | POA: Diagnosis present

## 2024-01-21 DIAGNOSIS — Z1152 Encounter for screening for COVID-19: Secondary | ICD-10-CM | POA: Diagnosis not present

## 2024-01-21 DIAGNOSIS — I452 Bifascicular block: Secondary | ICD-10-CM | POA: Diagnosis present

## 2024-01-21 DIAGNOSIS — J189 Pneumonia, unspecified organism: Secondary | ICD-10-CM | POA: Diagnosis present

## 2024-01-21 DIAGNOSIS — E8729 Other acidosis: Secondary | ICD-10-CM | POA: Diagnosis present

## 2024-01-21 DIAGNOSIS — N184 Chronic kidney disease, stage 4 (severe): Secondary | ICD-10-CM | POA: Diagnosis present

## 2024-01-21 DIAGNOSIS — I5033 Acute on chronic diastolic (congestive) heart failure: Secondary | ICD-10-CM | POA: Diagnosis present

## 2024-01-21 DIAGNOSIS — D631 Anemia in chronic kidney disease: Secondary | ICD-10-CM | POA: Diagnosis present

## 2024-01-21 DIAGNOSIS — Z79890 Hormone replacement therapy (postmenopausal): Secondary | ICD-10-CM | POA: Diagnosis not present

## 2024-01-21 DIAGNOSIS — D84821 Immunodeficiency due to drugs: Secondary | ICD-10-CM | POA: Diagnosis present

## 2024-01-21 DIAGNOSIS — I130 Hypertensive heart and chronic kidney disease with heart failure and stage 1 through stage 4 chronic kidney disease, or unspecified chronic kidney disease: Secondary | ICD-10-CM | POA: Diagnosis present

## 2024-01-21 DIAGNOSIS — Z923 Personal history of irradiation: Secondary | ICD-10-CM | POA: Diagnosis not present

## 2024-01-21 DIAGNOSIS — N1831 Chronic kidney disease, stage 3a: Secondary | ICD-10-CM | POA: Diagnosis not present

## 2024-01-21 DIAGNOSIS — E7800 Pure hypercholesterolemia, unspecified: Secondary | ICD-10-CM | POA: Diagnosis present

## 2024-01-21 DIAGNOSIS — E039 Hypothyroidism, unspecified: Secondary | ICD-10-CM | POA: Diagnosis present

## 2024-01-21 DIAGNOSIS — R0602 Shortness of breath: Secondary | ICD-10-CM | POA: Diagnosis present

## 2024-01-21 DIAGNOSIS — D508 Other iron deficiency anemias: Secondary | ICD-10-CM | POA: Diagnosis not present

## 2024-01-21 DIAGNOSIS — Z6838 Body mass index (BMI) 38.0-38.9, adult: Secondary | ICD-10-CM | POA: Diagnosis not present

## 2024-01-21 DIAGNOSIS — R197 Diarrhea, unspecified: Secondary | ICD-10-CM | POA: Diagnosis not present

## 2024-01-21 DIAGNOSIS — E860 Dehydration: Secondary | ICD-10-CM | POA: Diagnosis present

## 2024-01-21 DIAGNOSIS — D649 Anemia, unspecified: Secondary | ICD-10-CM | POA: Diagnosis not present

## 2024-01-21 DIAGNOSIS — C50919 Malignant neoplasm of unspecified site of unspecified female breast: Secondary | ICD-10-CM | POA: Diagnosis not present

## 2024-01-21 DIAGNOSIS — C50911 Malignant neoplasm of unspecified site of right female breast: Secondary | ICD-10-CM | POA: Diagnosis present

## 2024-01-21 DIAGNOSIS — Z7984 Long term (current) use of oral hypoglycemic drugs: Secondary | ICD-10-CM | POA: Diagnosis not present

## 2024-01-21 DIAGNOSIS — Z950 Presence of cardiac pacemaker: Secondary | ICD-10-CM | POA: Diagnosis not present

## 2024-01-21 LAB — GLUCOSE, CAPILLARY: Glucose-Capillary: 153 mg/dL — ABNORMAL HIGH (ref 70–99)

## 2024-01-21 LAB — PROCALCITONIN: Procalcitonin: 0.63 ng/mL

## 2024-01-21 LAB — PREPARE RBC (CROSSMATCH)

## 2024-01-21 LAB — CBC WITH DIFFERENTIAL/PLATELET
Abs Immature Granulocytes: 0.1 10*3/uL — ABNORMAL HIGH (ref 0.00–0.07)
Basophils Absolute: 0.1 10*3/uL (ref 0.0–0.1)
Basophils Relative: 1 %
Eosinophils Absolute: 0 10*3/uL (ref 0.0–0.5)
Eosinophils Relative: 0 %
HCT: 26.3 % — ABNORMAL LOW (ref 36.0–46.0)
Hemoglobin: 8.1 g/dL — ABNORMAL LOW (ref 12.0–15.0)
Lymphocytes Relative: 2 %
Lymphs Abs: 0.3 10*3/uL — ABNORMAL LOW (ref 0.7–4.0)
MCH: 32 pg (ref 26.0–34.0)
MCHC: 30.8 g/dL (ref 30.0–36.0)
MCV: 104 fL — ABNORMAL HIGH (ref 80.0–100.0)
Monocytes Absolute: 0.5 10*3/uL (ref 0.1–1.0)
Monocytes Relative: 4 %
Myelocytes: 1 %
Neutro Abs: 11.9 10*3/uL — ABNORMAL HIGH (ref 1.7–7.7)
Neutrophils Relative %: 92 %
Platelets: 115 10*3/uL — ABNORMAL LOW (ref 150–400)
RBC: 2.53 MIL/uL — ABNORMAL LOW (ref 3.87–5.11)
RDW: 14.7 % (ref 11.5–15.5)
WBC: 12.9 10*3/uL — ABNORMAL HIGH (ref 4.0–10.5)
nRBC: 0.2 % (ref 0.0–0.2)

## 2024-01-21 LAB — BASIC METABOLIC PANEL
Anion gap: 11 (ref 5–15)
BUN: 39 mg/dL — ABNORMAL HIGH (ref 8–23)
CO2: 15 mmol/L — ABNORMAL LOW (ref 22–32)
Calcium: 7 mg/dL — ABNORMAL LOW (ref 8.9–10.3)
Chloride: 105 mmol/L (ref 98–111)
Creatinine, Ser: 1.79 mg/dL — ABNORMAL HIGH (ref 0.44–1.00)
GFR, Estimated: 29 mL/min — ABNORMAL LOW (ref >60)
Glucose, Bld: 123 mg/dL — ABNORMAL HIGH (ref 70–99)
Potassium: 4.5 mmol/L (ref 3.5–5.1)
Sodium: 131 mmol/L — ABNORMAL LOW (ref 135–145)

## 2024-01-21 LAB — RETICULOCYTES
Immature Retic Fract: 39.7 % — ABNORMAL HIGH (ref 2.3–15.9)
RBC.: 2.47 MIL/uL — ABNORMAL LOW (ref 3.87–5.11)
Retic Count, Absolute: 64 10*3/uL (ref 19.0–186.0)
Retic Ct Pct: 2.6 % (ref 0.4–3.1)

## 2024-01-21 LAB — C-REACTIVE PROTEIN: CRP: 13.9 mg/dL — ABNORMAL HIGH (ref <1.0)

## 2024-01-21 LAB — IRON AND TIBC
Iron: 25 ug/dL — ABNORMAL LOW (ref 28–170)
Saturation Ratios: 11 % (ref 10.4–31.8)
TIBC: 218 ug/dL — ABNORMAL LOW (ref 250–450)
UIBC: 193 ug/dL

## 2024-01-21 LAB — FOLATE: Folate: 9.2 ng/mL (ref >5.9)

## 2024-01-21 LAB — MAGNESIUM: Magnesium: 1.6 mg/dL — ABNORMAL LOW (ref 1.7–2.4)

## 2024-01-21 LAB — FERRITIN: Ferritin: 239 ng/mL (ref 11–307)

## 2024-01-21 LAB — VITAMIN B12: Vitamin B-12: 4672 pg/mL — ABNORMAL HIGH (ref 180–914)

## 2024-01-21 LAB — ABO/RH: ABO/RH(D): A POS

## 2024-01-21 LAB — EXPECTORATED SPUTUM ASSESSMENT W GRAM STAIN, RFLX TO RESP C

## 2024-01-21 LAB — PATHOLOGIST SMEAR REVIEW

## 2024-01-21 LAB — HEMOGLOBIN A1C
Hgb A1c MFr Bld: 6.8 % — ABNORMAL HIGH (ref 4.8–5.6)
Mean Plasma Glucose: 148.46 mg/dL

## 2024-01-21 LAB — STREP PNEUMONIAE URINARY ANTIGEN: Strep Pneumo Urinary Antigen: NEGATIVE

## 2024-01-21 LAB — CREATININE, SERUM
Creatinine, Ser: 1.66 mg/dL — ABNORMAL HIGH (ref 0.44–1.00)
GFR, Estimated: 31 mL/min — ABNORMAL LOW (ref >60)

## 2024-01-21 MED ORDER — MAGNESIUM SULFATE 2 GM/50ML IV SOLN
2.0000 g | Freq: Once | INTRAVENOUS | Status: AC
  Administered 2024-01-21: 2 g via INTRAVENOUS
  Filled 2024-01-21: qty 50

## 2024-01-21 MED ORDER — FAMOTIDINE 20 MG PO TABS
20.0000 mg | ORAL_TABLET | Freq: Every day | ORAL | Status: DC
  Administered 2024-01-21 – 2024-01-26 (×6): 20 mg via ORAL
  Filled 2024-01-21 (×6): qty 1

## 2024-01-21 MED ORDER — ATORVASTATIN CALCIUM 40 MG PO TABS
40.0000 mg | ORAL_TABLET | Freq: Every day | ORAL | Status: DC
  Administered 2024-01-21 – 2024-01-25 (×5): 40 mg via ORAL
  Filled 2024-01-21 (×5): qty 1

## 2024-01-21 MED ORDER — FUROSEMIDE 10 MG/ML IJ SOLN
20.0000 mg | Freq: Once | INTRAMUSCULAR | Status: AC
  Administered 2024-01-21: 20 mg via INTRAVENOUS
  Filled 2024-01-21: qty 2

## 2024-01-21 MED ORDER — INSULIN ASPART 100 UNIT/ML IJ SOLN
0.0000 [IU] | Freq: Three times a day (TID) | INTRAMUSCULAR | Status: DC
  Administered 2024-01-22: 1 [IU] via SUBCUTANEOUS

## 2024-01-21 MED ORDER — SODIUM CHLORIDE 0.9 % IV SOLN: INTRAVENOUS | Status: AC

## 2024-01-21 MED ORDER — SODIUM CHLORIDE 0.9% IV SOLUTION: Freq: Once | INTRAVENOUS | Status: DC

## 2024-01-21 MED ORDER — ASPIRIN 81 MG PO TBEC
81.0000 mg | DELAYED_RELEASE_TABLET | ORAL | Status: DC
  Administered 2024-01-21 – 2024-01-25 (×3): 81 mg via ORAL
  Filled 2024-01-21 (×3): qty 1

## 2024-01-21 MED ORDER — MELATONIN 5 MG PO TABS
10.0000 mg | ORAL_TABLET | Freq: Every evening | ORAL | Status: DC | PRN
  Administered 2024-01-21 – 2024-01-25 (×6): 10 mg via ORAL
  Filled 2024-01-21 (×6): qty 2

## 2024-01-21 MED ORDER — GABAPENTIN 300 MG PO CAPS
600.0000 mg | ORAL_CAPSULE | Freq: Every day | ORAL | Status: DC
  Administered 2024-01-21 – 2024-01-25 (×6): 600 mg via ORAL
  Filled 2024-01-21 (×6): qty 2

## 2024-01-21 MED ORDER — COLESTIPOL HCL 1 G PO TABS: 1.0000 g | ORAL_TABLET | Freq: Every day | ORAL | Status: DC | PRN

## 2024-01-21 MED ORDER — FAMOTIDINE 20 MG PO TABS: 20.0000 mg | ORAL_TABLET | Freq: Two times a day (BID) | ORAL | Status: DC

## 2024-01-21 MED ORDER — ALPRAZOLAM 0.25 MG PO TABS
0.2500 mg | ORAL_TABLET | Freq: Every day | ORAL | Status: DC | PRN
  Administered 2024-01-21 – 2024-01-25 (×6): 0.25 mg via ORAL
  Filled 2024-01-21 (×6): qty 1

## 2024-01-21 MED ORDER — LEVOTHYROXINE SODIUM 75 MCG PO TABS
75.0000 ug | ORAL_TABLET | Freq: Every day | ORAL | Status: DC
  Administered 2024-01-21 – 2024-01-26 (×6): 75 ug via ORAL
  Filled 2024-01-21 (×6): qty 1

## 2024-01-21 MED ORDER — SODIUM CHLORIDE 0.9 % IV BOLUS
500.0000 mL | Freq: Once | INTRAVENOUS | Status: AC
  Administered 2024-01-21: 500 mL via INTRAVENOUS

## 2024-01-21 MED ORDER — FAMOTIDINE 20 MG PO TABS
20.0000 mg | ORAL_TABLET | Freq: Once | ORAL | Status: AC
  Administered 2024-01-21: 20 mg via ORAL
  Filled 2024-01-21: qty 1

## 2024-01-21 MED ORDER — SODIUM CHLORIDE 0.9 % IV SOLN
250.0000 mg | Freq: Every day | INTRAVENOUS | Status: AC
  Administered 2024-01-21 – 2024-01-23 (×3): 250 mg via INTRAVENOUS
  Filled 2024-01-21 (×3): qty 20

## 2024-01-21 NOTE — Progress Notes (Signed)
[facility]  PROGRESS NOTE  Kristin [name] WUJ:[id number] DOB: 27-Sep-1946 DOA: 01/20/2024 PCP: System, Provider Not In   Brief HPI:   78 year old female with past medical history significant for DM2, CKD IV b/l 1.7-2, bifascicular block s/p PPM, hypothyroidism, HTN, HLD and BrCA on Taxotere/Cytoxan presented with nausea, diarrhea since chemo last week.  She has been very weak, in bed since Wednesday because of diarrhea.  Today she tried to get to the shower, she was so weak patient brought to the ER.  She endorses low-grade fever starting today.  Positive shortness of breath no cough.  No nausea or vomiting.  Poor appetite.  Her antidiarrheal became effective and her diarrhea has decreased.   In the ER patient is hemodynamically stable but tachycardic.  Creatinine at baseline 1.86.  WBC 15.4.  Respiratory panel negative.  CXR: LLL pneumonia      Assessment/Plan:     Community acquired pneumonia -Started on Rocephin and Zithromax -Patient immunocompromise, on chemotherapy     Diarrhea -C. difficile, GI panel ordered -Not collected yet -Gentle IV fluid hydration     Anemia -Likely due to CKD and chemotherapy -Hemoglobin trending down from 12 =>10>8.8> 8.1 -Anemia panel ordered    HLD (hyperlipidemia) -Atorvastatin    HTN (hypertension) -Metoprolol, hydralazine, Imdur with hold parameters -Hold lisinopril with dehydration     Hypothyroidism -Synthroid resumed    OSA (obstructive sleep apnea) -CPAP ordered    T2DM -Sliding scale insulin.     QT prolongation-  Breast cancer (HCC) -On chemotherapy q3wk. Recurrent breast ca -1992 lumpectomy. No nodes. Chemo/xrt. Nothing until now -Serum magnesium was 1.6, replaced -Will recheck serum magnesium today   Medications     sodium chloride   Intravenous Once   aspirin EC  81 mg Oral QODAY   atorvastatin  40 mg Oral QHS   Chlorhexidine Gluconate Cloth  6 each Topical Daily   famotidine  20 mg Oral Daily    gabapentin  600 mg Oral QHS   heparin  5,000 Units Subcutaneous Q8H   insulin aspart  0-9 Units Subcutaneous TID WC   levothyroxine  75 mcg Oral Q0600   metoprolol succinate  50 mg Oral BID   sodium chloride flush  10-40 mL Intracatheter Q12H     Data Reviewed:   CBG:  No results for input(s): "GLUCAP" in the last 168 hours.  SpO2: 97 % O2 Flow Rate (L/min): 3 L/min    Vitals:   01/21/24 0523 01/21/24 0754 01/21/24 1055 01/21/24 1429  BP:  132/70 132/70 120/71  Pulse:  (!) 101 (!) 101 97  Resp:  20    Temp:    98.4 F (36.9 C)  TempSrc:    Oral  SpO2:  (!) 89%  97%  Weight: 103 kg     Height: 5\' 4"  (1.626 m)         Data Reviewed:  Basic Metabolic Panel: Recent Labs  Lab 01/20/24 1350 01/20/24 1410 01/21/24 0114  NA 134*  --  131*  K 4.2  --  4.5  CL 110  --  105  CO2 15*  --  15*  GLUCOSE 158*  --  123*  BUN 43*  --  39*  CREATININE 1.86*  --  1.79*  CALCIUM 7.3*  --  7.0*  MG  --  1.6*  --     CBC: Recent Labs  Lab 01/20/24 1350 01/21/24 0114  WBC 15.4* 12.9*  NEUTROABS 12.4* 11.9*  HGB 8.8* 8.1*  HCT 26.4* 26.3*  MCV 98.1 104.0*  PLT 118* 115*    LFT Recent Labs  Lab 01/20/24 1350  AST 23  ALT 25  ALKPHOS 82  BILITOT 0.4  PROT 5.4*  ALBUMIN 2.8*     Antibiotics: Anti-infectives (From admission, onward)    Start     Dose/Rate Route Frequency Ordered Stop   01/21/24 1000  cefTRIAXone (ROCEPHIN) 2 g in sodium chloride 0.9 % 100 mL IVPB  Status:  Discontinued        2 g 200 mL/hr over 30 Minutes Intravenous Every 24 hours 01/20/24 2330 01/20/24 2359   01/21/24 0800  doxycycline (VIBRAMYCIN) 100 mg in sodium chloride 0.9 % 250 mL IVPB        100 mg 125 mL/hr over 120 Minutes Intravenous Every 12 hours 01/20/24 2330     01/20/24 1630  cefTRIAXone (ROCEPHIN) 1 g in sodium chloride 0.9 % 100 mL IVPB        1 g 200 mL/hr over 30 Minutes Intravenous  Once 01/20/24 1628 01/20/24 1748   01/20/24 1630  doxycycline (VIBRAMYCIN) 100 mg in  sodium chloride 0.9 % 250 mL IVPB        100 mg 125 mL/hr over 120 Minutes Intravenous  Once 01/20/24 1628 01/20/24 2051        DVT prophylaxis: Heparin  Code Status: Full code  Family Communication: No family at bedside   CONSULTS oncology   Subjective   Denies shortness of breath   Objective    Physical Examination:   Appears in no acute distress S1-S2, regular Lungs clear to auscultation bilaterally  Status is: Inpatient:             [name]   [facility] If 7PM-7AM, please contact night-coverage at [email or web address], Office  [phone]   01/21/2024, 3:54 PM  LOS: 0 days

## 2024-01-21 NOTE — Plan of Care (Signed)
Problem: Education: Goal: Knowledge of General Education information will improve Description: Including pain rating scale, medication(s)/side effects and non-pharmacologic comfort measures Outcome: Progressing   Problem: Clinical Measurements: Goal: Ability to maintain clinical measurements within normal limits will improve Outcome: Progressing   Problem: Activity: Goal: Risk for activity intolerance will decrease Outcome: Progressing   Problem: Coping: Goal: Level of anxiety will decrease Outcome: Progressing   Problem: Pain Managment: Goal: General experience of comfort will improve and/or be controlled Outcome: Progressing

## 2024-01-21 NOTE — Consult Note (Signed)
Kristin [name] is well-known to me.  She is a very nice 78 year old white female.  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  She had a very high Oncotype score.  We have her on adjuvant chemotherapy.  She underwent a mastectomy on 10/2023.Marland Kitchen  She has had chemotherapy with Taxotere/Cytoxan.  She has had 2 cycles of treatment.  Last cycle of treatment was on 01/09/2024.  So far, she is tolerated this well.  She was in the office earlier I think for a injection.  I think she got Neulasta.  She failed pretty rough yesterday.  She had diarrhea.  She had a lot of weakness.  She had shortness of breath.  She had little bit of a temperature.  She had been on Levaquin as a prophylactic for her neutropenia.  She has gotten Neulasta.  She is taking the emergency room.  She is found to have pneumonia I think in the right base.  She is also anemic.  She was admitted.  When she was admitted, her sodium 134.  Potassium 4.2.  BUN 43 creatinine 1.86.  Calcium 7.3 with an albumin of 2.8.  Her white cell count was 12.9.  Hemoglobin 8.1.  Platelet count 115,000.  She is quite anemic.  I do think she is going need to be transfused.  She just feels very tired.  She has had no melena or bright red blood per rectum.  She has had no nausea or vomiting.  She has had little bit of a cough that is slightly productive.  She has had a little bit of leg swelling.    On her physical exam, her vital signs show temperature of 99.1.  Pulse 103.  Blood pressure 100/53.  Oxygen saturation is 89%.  Her head and exam shows no ocular or oral lesions.  There are no palpable cervical or supraclavicular lymph nodes.  Lungs do sound with wheezing.  She has decent air movement.  There may be some crackles bilaterally.  Cardiac exam is tachycardic but regular.  She has no murmurs.  Abdomen soft.  She is somewhat obese.  There is no fluid wave.  There is no palpable liver or spleen tip.  Extremity shows some  mild edema of the legs.  Neurological exam is nonfocal.  She had been received doxycycline and Rocephin.  I am not sure what she is to be continued on.  Again she needs to be transfused.  Her iron saturations are quite low.  Have the iron saturation is only 11%.  I do think that the iron in the blood will help her out quite a bit.  Again she has this pneumonia on chest x-ray.  We will follow along.  We will help in any way that we can.  I know that she will get great care from everybody up on 5 E.   Kristin Bach, MD  Kristin [name] 1:5

## 2024-01-21 NOTE — Progress Notes (Signed)
   01/20/24 2203  Assess: MEWS Score  Temp 99.5 F (37.5 C)  BP (!) 151/85 (RN Notified.)  MAP (mmHg) 105  Pulse Rate (!) 116  Resp (!) 25 (RN Notified.)  SpO2 95 %  O2 Device Nasal Cannula  O2 Flow Rate (L/min) 3 L/min  Assess: MEWS Score  MEWS Temp 0  MEWS Systolic 0  MEWS Pulse 2  MEWS RR 1  MEWS LOC 0  MEWS Score 3  MEWS Score Color Yellow  Assess: if the MEWS score is Yellow or Red  Were vital signs accurate and taken at a resting state? Yes  Does the patient meet 2 or more of the SIRS criteria? No  MEWS guidelines implemented  Yes, yellow  Treat  MEWS Interventions Considered administering scheduled or prn medications/treatments as ordered  Take Vital Signs  Increase Vital Sign Frequency  Yellow: Q2hr x1, continue Q4hrs until patient remains green for 12hrs  Escalate  MEWS: Escalate Yellow: Discuss with charge nurse and consider notifying provider and/or RRT  Notify: Charge Nurse/RN  Name of Charge Nurse/RN Notified Erskine Emery  Provider Notification  Provider Name/Title Dr. Si Raider  Date Provider Notified 01/21/24  Time Provider Notified 0010 (awaiting for the placement to assign to the admitting Docotr)  Notification Reason Change in status  Provider response See new orders  Date of Provider Response 01/21/24  Time of Provider Response 0011  Assess: SIRS CRITERIA  SIRS Temperature  0  SIRS Respirations  1  SIRS Pulse 1  SIRS WBC 0  SIRS Score Sum  2

## 2024-01-21 NOTE — Evaluation (Signed)
Physical Therapy Evaluation Patient Details Name: Kristin [name] MRN: [id number] DOB: 10-08-1946 Today's Date: 01/21/2024  History of Present Illness  Kristin [name] is a 78 y.o. female  presented with nausea, diarrhea since chemo. Pt admitted with pneumonia. 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 PT from [facility] living in stand alone home, ind at baseline without AD, reports feeling wiped out the week following chemo but energy level typically steadily improves, spouse present at home also ind. On eval, pt fatigues very quickly. Pt ambulates 15 ft before needing seated rest break, cued for pursed lip breathing, pt on RA with SpO2 90-93% and HR 90s-low 110s with ambulation. After seated rest, pt able to rise and ambulate back to EOB, again fatiguing quickly. Pt completes transfers from [facility] and recliner with CGA-supv, good steadiness, therapist managing IV line. Pt declines outpatient PT rec, reports active with outpatient OT at [facility]F for L shoulder and feels like OT is able to address deficits noted or pt can request outpatient PT at later date if weakness persists. Pt with spouse support and all DME at home if needed. Pt currently with functional limitations due to the deficits listed below (see PT Problem List). Pt will benefit from acute skilled PT to increase their independence and safety with mobility to allow discharge.           If plan is discharge home, recommend the following: Assistance with cooking/housework;Assist for transportation   Can travel by private vehicle        Equipment Recommendations None recommended by PT  Recommendations for Other Services       Functional Status Assessment Patient has had a recent decline in their functional status and demonstrates the ability  to make significant improvements in function in a reasonable and predictable amount of time.     Precautions / Restrictions Precautions Precautions: Fall Precaution/Restrictions Comments: monitor SpO2; has pacemaker Restrictions Weight Bearing Restrictions Per Provider Order: No      Mobility  Bed Mobility               General bed mobility comments: pt on BSC upon arrival, seated EOB at EOS    Transfers Overall transfer level: Needs assistance Equipment used: Rolling walker (2 wheels), None Transfers: Sit to/from Stand Sit to Stand: Contact guard assist, Supervision           General transfer comment: pt completes initial STS from [facility] without AD, BUE assisting to power up, good steadiness; 2nd STS from EOB with RW, pt pushing from bed to assist in powering up, no physical assistance, increased time, slightly unsteady from lower surface without LOB; 3rd STS from recliner with RW, BUE assisting to power up, good steadiness    Ambulation/Gait Ambulation/Gait assistance: Contact guard assist Gait Distance (Feet): 15 Feet (x2) Assistive device: Rolling walker (2 wheels) Gait Pattern/deviations: Step-through pattern, Decreased stride length Gait velocity: decreased     General Gait Details: short, slow steps wtih RW, pt fatigues quickly needing seated rest break in recliner in room, labored breathing noted, pt on RA with SpO2 90-93% with ambulation and HR 90s- low110s, able to ambulate back to EOB after seated rest break, no overt LOB but noted LE shakiness with gait  Careers information officer  Tilt Bed    Modified Rankin (Stroke Patients Only)       Balance Overall balance assessment: Mild deficits observed, not formally tested                                           Pertinent Vitals/Pain Pain Assessment Pain Assessment: No/denies pain    Home Living Family/patient expects to be discharged to:: Private  residence Living Arrangements: Spouse/significant other Available Help at Discharge: Family;Available 24 hours/day Type of Home: House Home Access: Level entry       Home Layout: One level Home Equipment: Cane - single Librarian, academic (2 wheels);Shower seat - built in;Grab bars - tub/shower;Adaptive equipment Additional Comments: pt lives at [facility] in stand alone home, active with outpatient OT for L shoulder    Prior Function Prior Level of Function : Independent/Modified Independent;Driving             Mobility Comments: pt reports ind with household and community ambulation, reports weakness the week of chemo treatments then improves steadily ADLs Comments: pt reports ind with increasead time, active with OT for L shoulder     Extremity/Trunk Assessment   Upper Extremity Assessment Upper Extremity Assessment: Overall WFL for tasks assessed    Lower Extremity Assessment Lower Extremity Assessment: Generalized weakness (AROM WFL, strength grossly 3+/5)    Cervical / Trunk Assessment Cervical / Trunk Assessment: Normal  Communication   Communication Communication: No apparent difficulties    Cognition Arousal: Alert Behavior During Therapy: WFL for tasks assessed/performed   PT - Cognitive impairments: No apparent impairments                         Following commands: Intact       Cueing       General Comments General comments (skin integrity, edema, etc.): Pt on RA with Spo2 90-93%, HR 90s-low 110s    Exercises     Assessment/Plan    PT Assessment Patient needs continued PT services  PT Problem List Decreased strength;Decreased activity tolerance;Decreased balance;Cardiopulmonary status limiting activity;Obesity       PT Treatment Interventions DME instruction;Gait training;Functional mobility training;Therapeutic activities;Therapeutic exercise;Balance training;Neuromuscular re-education;Patient/family education    PT  Goals (Current goals can be found in the Care Plan section)  Acute Rehab PT Goals Patient Stated Goal: return home, resume outpatient OT PT Goal Formulation: With patient Time For Goal Achievement: 02/04/24 Potential to Achieve Goals: Good    Frequency Min 1X/week     Co-evaluation               AM-PAC PT "6 Clicks" Mobility  Outcome Measure Help needed turning from your back to your side while in a flat bed without using bedrails?: A Little Help needed moving from lying on your back to sitting on the side of a flat bed without using bedrails?: A Little Help needed moving to and from a bed to a chair (including a wheelchair)?: A Little Help needed standing up from a chair using your arms (e.g., wheelchair or bedside chair)?: A Little Help needed to walk in hospital room?: A Little Help needed climbing 3-5 steps with a railing? : A Lot 6 Click Score: 17    End of Session   Activity Tolerance: Patient tolerated treatment well Patient left: in bed;with call bell/phone within reach;with nursing/sitter in room (sitting EOB with  RN in room) Nurse Communication: Mobility status;Other (comment) (SpO2 on RA) PT Visit Diagnosis: Muscle weakness (generalized) (M62.81);Other abnormalities of gait and mobility (R26.89)    Time: 8295-6213 PT Time Calculation (min) (ACUTE ONLY): 27 min   Charges:   PT Evaluation $PT Eval Moderate Complexity: 1 Mod PT Treatments $Therapeutic Activity: 8-22 mins PT General Charges $$ ACUTE PT VISIT: 1 Visit         Tori Stevee Valenta PT, DPT 01/21/24, 11:37 AM

## 2024-01-21 NOTE — Care Management Obs Status (Signed)
MEDICARE OBSERVATION STATUS NOTIFICATION   Patient Details  Name: Kristin [name] MRN: [id number] Date of Birth: 25-Oct-1946   Medicare Observation Status Notification Given:  Yes    Diona Browner, LCSW 01/21/2024, 2:57 PM

## 2024-01-22 DIAGNOSIS — D649 Anemia, unspecified: Secondary | ICD-10-CM | POA: Diagnosis not present

## 2024-01-22 DIAGNOSIS — D508 Other iron deficiency anemias: Secondary | ICD-10-CM | POA: Diagnosis not present

## 2024-01-22 DIAGNOSIS — J189 Pneumonia, unspecified organism: Secondary | ICD-10-CM | POA: Diagnosis not present

## 2024-01-22 DIAGNOSIS — C50919 Malignant neoplasm of unspecified site of unspecified female breast: Secondary | ICD-10-CM | POA: Diagnosis not present

## 2024-01-22 LAB — CBC WITH DIFFERENTIAL/PLATELET
Abs Immature Granulocytes: 1.07 10*3/uL — ABNORMAL HIGH (ref 0.00–0.07)
Basophils Absolute: 0 10*3/uL (ref 0.0–0.1)
Basophils Relative: 0 %
Eosinophils Absolute: 0.1 10*3/uL (ref 0.0–0.5)
Eosinophils Relative: 1 %
HCT: 26.9 % — ABNORMAL LOW (ref 36.0–46.0)
Hemoglobin: 8.7 g/dL — ABNORMAL LOW (ref 12.0–15.0)
Immature Granulocytes: 11 %
Lymphocytes Relative: 6 %
Lymphs Abs: 0.6 10*3/uL — ABNORMAL LOW (ref 0.7–4.0)
MCH: 31.6 pg (ref 26.0–34.0)
MCHC: 32.3 g/dL (ref 30.0–36.0)
MCV: 97.8 fL (ref 80.0–100.0)
Monocytes Absolute: 0.7 10*3/uL (ref 0.1–1.0)
Monocytes Relative: 8 %
Neutro Abs: 6.9 10*3/uL (ref 1.7–7.7)
Neutrophils Relative %: 74 %
Platelets: 103 10*3/uL — ABNORMAL LOW (ref 150–400)
RBC: 2.75 MIL/uL — ABNORMAL LOW (ref 3.87–5.11)
RDW: 17.1 % — ABNORMAL HIGH (ref 11.5–15.5)
WBC: 9.4 10*3/uL (ref 4.0–10.5)
nRBC: 0.3 % — ABNORMAL HIGH (ref 0.0–0.2)

## 2024-01-22 LAB — LEGIONELLA PNEUMOPHILA SEROGP 1 UR AG: L. pneumophila Serogp 1 Ur Ag: NEGATIVE

## 2024-01-22 LAB — COMPREHENSIVE METABOLIC PANEL
ALT: 18 U/L (ref 0–44)
AST: 20 U/L (ref 15–41)
Albumin: 2.4 g/dL — ABNORMAL LOW (ref 3.5–5.0)
Alkaline Phosphatase: 61 U/L (ref 38–126)
Anion gap: 8 (ref 5–15)
BUN: 31 mg/dL — ABNORMAL HIGH (ref 8–23)
CO2: 18 mmol/L — ABNORMAL LOW (ref 22–32)
Calcium: 6.8 mg/dL — ABNORMAL LOW (ref 8.9–10.3)
Chloride: 111 mmol/L (ref 98–111)
Creatinine, Ser: 1.54 mg/dL — ABNORMAL HIGH (ref 0.44–1.00)
GFR, Estimated: 34 mL/min — ABNORMAL LOW (ref >60)
Glucose, Bld: 113 mg/dL — ABNORMAL HIGH (ref 70–99)
Potassium: 4.1 mmol/L (ref 3.5–5.1)
Sodium: 137 mmol/L (ref 135–145)
Total Bilirubin: 0.6 mg/dL (ref 0.0–1.2)
Total Protein: 4.8 g/dL — ABNORMAL LOW (ref 6.5–8.1)

## 2024-01-22 LAB — GLUCOSE, CAPILLARY
Glucose-Capillary: 110 mg/dL — ABNORMAL HIGH (ref 70–99)
Glucose-Capillary: 135 mg/dL — ABNORMAL HIGH (ref 70–99)
Glucose-Capillary: 119 mg/dL — ABNORMAL HIGH (ref 70–99)
Glucose-Capillary: 147 mg/dL — ABNORMAL HIGH (ref 70–99)

## 2024-01-22 LAB — OCCULT BLOOD X 1 CARD TO LAB, STOOL: Fecal Occult Bld: NEGATIVE

## 2024-01-22 LAB — MAGNESIUM: Magnesium: 1.9 mg/dL (ref 1.7–2.4)

## 2024-01-22 MED ORDER — FUROSEMIDE 10 MG/ML IJ SOLN
20.0000 mg | Freq: Once | INTRAMUSCULAR | Status: AC
  Administered 2024-01-22: 20 mg via INTRAVENOUS
  Filled 2024-01-22: qty 2

## 2024-01-22 MED ORDER — METOCLOPRAMIDE HCL 5 MG/ML IJ SOLN
5.0000 mg | Freq: Four times a day (QID) | INTRAMUSCULAR | Status: DC | PRN
  Administered 2024-01-22: 5 mg via INTRAVENOUS
  Filled 2024-01-22: qty 2

## 2024-01-22 MED ORDER — SODIUM CHLORIDE 0.9 % IV SOLN
2.0000 g | INTRAVENOUS | Status: DC
  Administered 2024-01-22 – 2024-01-25 (×4): 2 g via INTRAVENOUS
  Filled 2024-01-22 (×4): qty 20

## 2024-01-22 MED ORDER — METOCLOPRAMIDE HCL 5 MG/ML IJ SOLN
5.0000 mg | Freq: Three times a day (TID) | INTRAMUSCULAR | Status: DC
Start: 2024-01-22 — End: 2024-01-22

## 2024-01-22 NOTE — Plan of Care (Signed)

## 2024-01-22 NOTE — Progress Notes (Signed)
   01/21/24 1457  TOC Brief Assessment  Insurance and Status Reviewed  Patient has primary care physician Yes  Home environment has been reviewed home w/ spouse ([facility])  Prior level of function: independent  Prior/Current Home Services Current home services (outpatient OT)  Social Drivers of Health Review SDOH reviewed no interventions necessary  Readmission risk has been reviewed Yes  Transition of care needs transition of care needs identified, TOC will continue to follow

## 2024-01-22 NOTE — Progress Notes (Signed)
[facility]  PROGRESS NOTE  Kristin [name] AOZ:[id number] DOB: [birth year] DOA: 01/20/2024 PCP: Bill Salinas I, NP   Brief HPI:   78 year old female with past medical history significant for DM2, CKD IV b/l 1.7-2, bifascicular block s/p PPM, hypothyroidism, HTN, HLD and BrCA on Taxotere/Cytoxan presented with nausea, diarrhea since chemo last week.  She has been very weak, in bed since Wednesday because of diarrhea.  Today she tried to get to the shower, she was so weak patient brought to the ER.  She endorses low-grade fever starting today.  Positive shortness of breath no cough.  No nausea or vomiting.  Poor appetite.  Her antidiarrheal became effective and her diarrhea has decreased.   In the ER patient is hemodynamically stable but tachycardic.  Creatinine at baseline 1.86.  WBC 15.4.  Respiratory panel negative.  CXR: LLL pneumonia      Assessment/Plan:     Community acquired pneumonia -Started on Rocephin and Zithromax -Patient immunocompromised, on chemotherapy     Diarrhea -C. difficile, GI panel ordered -Not collected yet      Anemia -Likely due to CKD and chemotherapy -Hemoglobin trending down from 12 =>10>8.8> 8.7>9.6 -Anemia panel showed iron 25, 11% saturation, ferritin 239, folate 9.2, B12 4672    HLD (hyperlipidemia) -Atorvastatin    HTN (hypertension) -Metoprolol, hydralazine, Imdur with hold parameters -Hold lisinopril with dehydration     Hypothyroidism -Synthroid resumed    OSA (obstructive sleep apnea) -CPAP ordered    T2DM -Sliding scale insulin.     QT prolongation-  Breast cancer (HCC) -On chemotherapy q3wk. Recurrent breast ca -1992 lumpectomy. No nodes. Chemo/xrt. Nothing until now -Serum magnesium was 1.6, replaced -Serum magnesium 1.9   Medications     sodium chloride   Intravenous Once   aspirin EC  81 mg Oral QODAY   atorvastatin  40 mg Oral QHS   Chlorhexidine Gluconate Cloth  6 each Topical Daily   famotidine   20 mg Oral Daily   gabapentin  600 mg Oral QHS   heparin  5,000 Units Subcutaneous Q8H   insulin aspart  0-9 Units Subcutaneous TID WC   levothyroxine  75 mcg Oral Q0600   metoprolol succinate  50 mg Oral BID   sodium chloride flush  10-40 mL Intracatheter Q12H     Data Reviewed:   CBG:  Recent Labs  Lab 01/21/24 2157 01/22/24 0745  GLUCAP 153* 110*    SpO2: 90 % O2 Flow Rate (L/min): 3 L/min    Vitals:   01/22/24 0158 01/22/24 0200 01/22/24 0233 01/22/24 0454  BP: 113/64  116/67 (!) 117/56  Pulse: 92  90 82  Resp: 18  18 18   Temp: 99.5 F (37.5 C) 98.9 F (37.2 C) 99.4 F (37.4 C) 99.2 F (37.3 C)  TempSrc: Oral Oral Oral Oral  SpO2: 94%  92% 90%  Weight:      Height:          Data Reviewed:  Basic Metabolic Panel: Recent Labs  Lab 01/20/24 1350 01/20/24 1410 01/21/24 0114 01/21/24 1810 01/22/24 0600 01/22/24 0608  NA 134*  --  131*  --  137  --   K 4.2  --  4.5  --  4.1  --   CL 110  --  105  --  111  --   CO2 15*  --  15*  --  18*  --   GLUCOSE 158*  --  123*  --  113*  --   BUN  43*  --  39*  --  31*  --   CREATININE 1.86*  --  1.79* 1.66* 1.54*  --   CALCIUM 7.3*  --  7.0*  --  6.8*  --   MG  --  1.6*  --  1.6*  --  1.9    CBC: Recent Labs  Lab 01/20/24 1350 01/21/24 0114 01/22/24 0600  WBC 15.4* 12.9* 9.4  NEUTROABS 12.4* 11.9* 6.9  HGB 8.8* 8.1* 8.7*  HCT 26.4* 26.3* 26.9*  MCV 98.1 104.0* 97.8  PLT 118* 115* 103*    LFT Recent Labs  Lab 01/20/24 1350 01/22/24 0600  AST 23 20  ALT 25 18  ALKPHOS 82 61  BILITOT 0.4 0.6  PROT 5.4* 4.8*  ALBUMIN 2.8* 2.4*     Antibiotics: Anti-infectives (From admission, onward)    Start     Dose/Rate Route Frequency Ordered Stop   01/21/24 1000  cefTRIAXone (ROCEPHIN) 2 g in sodium chloride 0.9 % 100 mL IVPB  Status:  Discontinued        2 g 200 mL/hr over 30 Minutes Intravenous Every 24 hours 01/20/24 2330 01/20/24 2359   01/21/24 0800  doxycycline (VIBRAMYCIN) 100 mg in sodium  chloride 0.9 % 250 mL IVPB        100 mg 125 mL/hr over 120 Minutes Intravenous Every 12 hours 01/20/24 2330     01/20/24 1630  cefTRIAXone (ROCEPHIN) 1 g in sodium chloride 0.9 % 100 mL IVPB        1 g 200 mL/hr over 30 Minutes Intravenous  Once 01/20/24 1628 01/20/24 1748   01/20/24 1630  doxycycline (VIBRAMYCIN) 100 mg in sodium chloride 0.9 % 250 mL IVPB        100 mg 125 mL/hr over 120 Minutes Intravenous  Once 01/20/24 1628 01/20/24 2051        DVT prophylaxis: Heparin  Code Status: Full code  Family Communication: No family at bedside   CONSULTS oncology   Subjective   Still complains of dyspnea on exertion   Objective    Physical Examination:  General-appears in no acute distress Heart-S1-S2, regular, no murmur auscultated Lungs-bibasilar crackles auscultated Abdomen-soft, nontender, no organomegaly Extremities-no edema in the lower extremities Neuro-alert, oriented x3, no focal deficit noted  Status is: Inpatient:             [name]   [facility] If 7PM-7AM, please contact night-coverage at [email or web address], Office  [phone]   01/22/2024, 9:42 AM  LOS: 1 day

## 2024-01-23 DIAGNOSIS — N1831 Chronic kidney disease, stage 3a: Secondary | ICD-10-CM | POA: Diagnosis not present

## 2024-01-23 DIAGNOSIS — J189 Pneumonia, unspecified organism: Secondary | ICD-10-CM | POA: Diagnosis not present

## 2024-01-23 DIAGNOSIS — R197 Diarrhea, unspecified: Secondary | ICD-10-CM | POA: Diagnosis not present

## 2024-01-23 DIAGNOSIS — D631 Anemia in chronic kidney disease: Secondary | ICD-10-CM

## 2024-01-23 DIAGNOSIS — C50919 Malignant neoplasm of unspecified site of unspecified female breast: Secondary | ICD-10-CM | POA: Diagnosis not present

## 2024-01-23 LAB — TYPE AND SCREEN
ABO/RH(D): A POS
Antibody Screen: NEGATIVE
Unit division: 0
Unit division: 0

## 2024-01-23 LAB — CBC WITH DIFFERENTIAL/PLATELET
Abs Immature Granulocytes: 0.58 10*3/uL — ABNORMAL HIGH (ref 0.00–0.07)
Basophils Absolute: 0 10*3/uL (ref 0.0–0.1)
Basophils Relative: 0 %
Eosinophils Absolute: 0.1 10*3/uL (ref 0.0–0.5)
Eosinophils Relative: 1 %
HCT: 30.2 % — ABNORMAL LOW (ref 36.0–46.0)
Hemoglobin: 9.6 g/dL — ABNORMAL LOW (ref 12.0–15.0)
Immature Granulocytes: 7 %
Lymphocytes Relative: 6 %
Lymphs Abs: 0.5 10*3/uL — ABNORMAL LOW (ref 0.7–4.0)
MCH: 31.8 pg (ref 26.0–34.0)
MCHC: 31.8 g/dL (ref 30.0–36.0)
MCV: 100 fL (ref 80.0–100.0)
Monocytes Absolute: 0.6 10*3/uL (ref 0.1–1.0)
Monocytes Relative: 7 %
Neutro Abs: 6.2 10*3/uL (ref 1.7–7.7)
Neutrophils Relative %: 79 %
Platelets: 103 10*3/uL — ABNORMAL LOW (ref 150–400)
RBC: 3.02 MIL/uL — ABNORMAL LOW (ref 3.87–5.11)
RDW: 16.9 % — ABNORMAL HIGH (ref 11.5–15.5)
WBC: 7.9 10*3/uL (ref 4.0–10.5)
nRBC: 0 % (ref 0.0–0.2)

## 2024-01-23 LAB — BPAM RBC
Blood Product Expiration Date: 202503112359
ISSUE DATE / TIME: 202502122331
ISSUE DATE / TIME: 202502130208
ISSUE DATE / TIME: 202503112359
Unit Type and Rh: 202503112359
Unit Type and Rh: 202503112359
Unit Type and Rh: 6200
Unit Type and Rh: 6200

## 2024-01-23 LAB — GLUCOSE, CAPILLARY
Glucose-Capillary: 101 mg/dL — ABNORMAL HIGH (ref 70–99)
Glucose-Capillary: 87 mg/dL (ref 70–99)
Glucose-Capillary: 96 mg/dL (ref 70–99)
Glucose-Capillary: 108 mg/dL — ABNORMAL HIGH (ref 70–99)

## 2024-01-23 LAB — COMPREHENSIVE METABOLIC PANEL
ALT: 20 U/L (ref 0–44)
AST: 25 U/L (ref 15–41)
Albumin: 2.4 g/dL — ABNORMAL LOW (ref 3.5–5.0)
Alkaline Phosphatase: 76 U/L (ref 38–126)
Anion gap: 10 (ref 5–15)
BUN: 25 mg/dL — ABNORMAL HIGH (ref 8–23)
CO2: 16 mmol/L — ABNORMAL LOW (ref 22–32)
Calcium: 6.7 mg/dL — ABNORMAL LOW (ref 8.9–10.3)
Chloride: 108 mmol/L (ref 98–111)
Creatinine, Ser: 1.36 mg/dL — ABNORMAL HIGH (ref 0.44–1.00)
GFR, Estimated: 40 mL/min — ABNORMAL LOW (ref >60)
Glucose, Bld: 118 mg/dL — ABNORMAL HIGH (ref 70–99)
Potassium: 3.3 mmol/L — ABNORMAL LOW (ref 3.5–5.1)
Sodium: 134 mmol/L — ABNORMAL LOW (ref 135–145)
Total Bilirubin: 0.5 mg/dL (ref 0.0–1.2)
Total Protein: 5.2 g/dL — ABNORMAL LOW (ref 6.5–8.1)

## 2024-01-23 LAB — CULTURE, RESPIRATORY W GRAM STAIN

## 2024-01-23 MED ORDER — FUROSEMIDE 10 MG/ML IJ SOLN
20.0000 mg | Freq: Once | INTRAMUSCULAR | Status: AC
  Administered 2024-01-23: 20 mg via INTRAVENOUS
  Filled 2024-01-23: qty 2

## 2024-01-23 MED ORDER — LOPERAMIDE HCL 2 MG PO CAPS
2.0000 mg | ORAL_CAPSULE | Freq: Four times a day (QID) | ORAL | Status: DC | PRN
  Administered 2024-01-23: 2 mg via ORAL
  Filled 2024-01-23: qty 1

## 2024-01-23 MED ORDER — ALTEPLASE 2 MG IJ SOLR
2.0000 mg | Freq: Once | INTRAMUSCULAR | Status: AC
  Administered 2024-01-23: 2 mg
  Filled 2024-01-23: qty 2

## 2024-01-23 MED ORDER — EPOETIN ALFA 40000 UNIT/ML IJ SOLN
40000.0000 [IU] | Freq: Once | INTRAMUSCULAR | Status: AC
  Administered 2024-01-23: 40000 [IU] via SUBCUTANEOUS
  Filled 2024-01-23: qty 4

## 2024-01-23 NOTE — Progress Notes (Signed)
It appears that the problem today is her Port-A-Cath.  Apparently, blood can be pulled out of the Port-A-Cath.  I do not have labs back yet today.  She had tPA put into the port to try to open it up.  If this does not work, then I am sure that IR will be able to open.  A little bit surprised that she did not respond better to the transfusion.  Her hemoglobin yesterday was only 8.7.  She is getting IV iron.  It seems as if her breathing might be doing a little bit better.  She saw a little bit of a cough.  I really would be nice to have the labs back today.  I suspect we probably have a might use some Procrit to try to help with her blood.  I am sure with her renal insufficiency, she has a low erythropoietin level.  She is eating okay.  There is no nausea or vomiting.  Her vital signs show temperature of 98.9.  Pulse 93.  Blood pressure 131/72.  Oxygen saturation 95%.  Her lungs sound pretty clear bilaterally.  There is still some occasional wheezing.  Cardiac exam regular rate and rhythm.  Abdomen is obese but soft.  Bowel sounds are present.  There is no fluid wave.  Extremity shows no clubbing, cyanosis or edema.  Again, we do not have lab work back yet from today.  Her Port-A-Cath not working.  I really need to see what her hemoglobin is.  I suppose this pneumonia might be getting better.  I do not know she is a x-rays or not.  I do appreciate everybody's help on 5 E.  I know everybody is doing a great job with her.   [name], MD  1 Cor 13:4

## 2024-01-23 NOTE — Plan of Care (Signed)

## 2024-01-23 NOTE — Plan of Care (Signed)
  Problem: Education: Goal: Knowledge of General Education information will improve Description: Including pain rating scale, medication(s)/side effects and non-pharmacologic comfort measures Outcome: Progressing   Problem: Clinical Measurements: Goal: Ability to maintain clinical measurements within normal limits will improve Outcome: Progressing Goal: Will remain free from infection Outcome: Progressing Goal: Respiratory complications will improve Outcome: Progressing   Problem: Nutrition: Goal: Adequate nutrition will be maintained Outcome: Progressing   Problem: Pain Managment: Goal: General experience of comfort will improve and/or be controlled Outcome: Progressing   Problem: Safety: Goal: Ability to remain free from injury will improve Outcome: Progressing   Problem: Health Behavior/Discharge Planning: Goal: Ability to identify and utilize available resources and services will improve Outcome: Progressing   Problem: Metabolic: Goal: Ability to maintain appropriate glucose levels will improve Outcome: Progressing

## 2024-01-23 NOTE — Progress Notes (Signed)
Physical Therapy Treatment Patient Details Name: Kristin [name] MRN: [id number] DOB: 30-Jun-1946 Today's Date: 01/23/2024   History of Present Illness Kristin [name] is a 78 y.o. female  presented with nausea, diarrhea since chemo. Pt admitted with pneumonia. 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 Pt states she has been up to bathroom numerous times today due to lasix.  Pt ambulated short distance in hallway and feels ready for d/c home (she is hoping for tomorrow.)     If plan is discharge home, recommend the following: Assistance with cooking/housework;Assist for transportation   Can travel by private vehicle        Equipment Recommendations  None recommended by PT    Recommendations for Other Services       Precautions / Restrictions Precautions Precautions: Fall Precaution/Restrictions Comments: monitor SpO2; has pacemaker     Mobility  Bed Mobility Overal bed mobility: Modified Independent                  Transfers Overall transfer level: Modified independent                      Ambulation/Gait Ambulation/Gait assistance: Supervision Gait Distance (Feet): 60 Feet Assistive device: IV Pole Gait Pattern/deviations: Step-through pattern, Decreased stride length Gait velocity: decreased     General Gait Details: slow but cautious gait, pt pushed IV pole, Spo2 99% on room air during ambulation, distance to tolerance   Stairs             Wheelchair Mobility     Tilt Bed    Modified Rankin (Stroke Patients Only)       Balance                                            Communication Communication Communication: No apparent difficulties  Cognition Arousal: Alert Behavior During Therapy: WFL for tasks  assessed/performed   PT - Cognitive impairments: No apparent impairments                         Following commands: Intact      Cueing    Exercises      General Comments        Pertinent Vitals/Pain Pain Assessment Pain Assessment: No/denies pain    Home Living                          Prior Function            PT Goals (current goals can now be found in the care plan section) Progress towards PT goals: Progressing toward goals    Frequency    Min 1X/week      PT Plan      Co-evaluation              AM-PAC PT "6 Clicks" Mobility   Outcome Measure  Help needed turning from your back to your side while in a flat bed without using bedrails?: A Little Help needed moving from lying on your back to sitting on the side of a flat bed without using bedrails?: A Little Help needed moving to and  from a bed to a chair (including a wheelchair)?: A Little Help needed standing up from a chair using your arms (e.g., wheelchair or bedside chair)?: A Little Help needed to walk in hospital room?: A Little Help needed climbing 3-5 steps with a railing? : A Lot 6 Click Score: 17    End of Session   Activity Tolerance: Patient tolerated treatment well Patient left: in bed;with call bell/phone within reach Nurse Communication: Mobility status PT Visit Diagnosis: Muscle weakness (generalized) (M62.81);Other abnormalities of gait and mobility (R26.89)     Time: 1610-9604 PT Time Calculation (min) (ACUTE ONLY): 11 min  Charges:    $Gait Training: 8-22 mins PT General Charges $$ ACUTE PT VISIT: 1 Visit                    Paulino Door, DPT Physical Therapist Acute Rehabilitation Services Office: [phone]    Kristin [name] 01/23/2024, 4:25 PM

## 2024-01-23 NOTE — Progress Notes (Signed)
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  She has been very weak, in bed since Wednesday because of diarrhea.  Today she tried to get to the shower, she was so weak patient brought to the ER.  She endorses low-grade fever starting today.  Positive shortness of breath no cough.  No nausea or vomiting.  Poor appetite.  Her antidiarrheal became effective and her diarrhea has decreased.   In the ER patient is hemodynamically stable but tachycardic.  Creatinine at baseline 1.86.  WBC 15.4.  Respiratory panel negative.  CXR: LLL pneumonia      Assessment/Plan:     Community acquired pneumonia -Started on Rocephin and Zithromax -Patient immunocompromised, on chemotherapy  Acute on chronic diastolic CHF -Diuresed well with IV Lasix -Will give additional dose of Lasix 20 mg IV     Diarrhea -GI pathogen panel ordered      Anemia -Likely due to CKD and chemotherapy -Hemoglobin trending down from 12 =>10>8.8> 8.7 -Anemia panel showed iron 25, 11% saturation, ferritin 239, folate 9.2, B12 4672    HLD (hyperlipidemia) -Atorvastatin    HTN (hypertension) -Metoprolol, hydralazine, Imdur with hold parameters -Hold lisinopril with dehydration     Hypothyroidism -Synthroid resumed    OSA (obstructive sleep apnea) -CPAP ordered    T2DM -Sliding scale insulin.     QT prolongation-  Breast cancer (HCC) -On chemotherapy q3wk. Recurrent breast ca -1992 lumpectomy. No nodes. Chemo/xrt. Nothing until now -Serum magnesium was 1.6, replaced -Serum magnesium 1.9   Medications     sodium chloride   Intravenous Once   aspirin EC  81 mg Oral QODAY   atorvastatin  40  mg Oral QHS   Chlorhexidine Gluconate Cloth  6 each Topical Daily   epoetin alfa  40,000 Units Subcutaneous Once   famotidine  20 mg Oral Daily   gabapentin  600 mg Oral QHS   heparin  5,000 Units Subcutaneous Q8H   insulin aspart  0-9 Units Subcutaneous TID WC   levothyroxine  75 mcg Oral Q0600   metoprolol succinate  50 mg Oral BID   sodium chloride flush  10-40 mL Intracatheter Q12H     Data Reviewed:   CBG:  Recent Labs  Lab 01/22/24 0745 01/22/24 1136 01/22/24 1636 01/22/24 2100 01/23/24 0744  GLUCAP 110* 135* 119* 147* 101*    SpO2: 95 % O2 Flow Rate (L/min): 3 L/min    Vitals:   01/22/24 0454 01/22/24 1251 01/22/24 2037 01/23/24 0511  BP: (!) 117/56 (!) 143/90 118/68 131/72  Pulse: 82 98 99 93  Resp: 18  18 17   Temp: 99.2 F (37.3 C) 98 F (36.7 C) 98.9 F (37.2 C) 98.9 F (37.2 C)  TempSrc: Oral Oral Oral Oral  SpO2: 90% 100% 94% 95%  Weight:      Height:          Data Reviewed:  Basic Metabolic Panel: Recent Labs  Lab 01/20/24 1350 01/20/24 1410 01/21/24 0114 01/21/24 1810 01/22/24 0600 01/22/24 0608  NA 134*  --  131*  --  137  --   K 4.2  --  4.5  --  4.1  --   CL 110  --  105  --  111  --   CO2 15*  --  15*  --  18*  --   GLUCOSE 158*  --  123*  --  113*  --   BUN 43*  --  39*  --  31*  --   CREATININE 1.86*  --  1.79* 1.66* 1.54*  --   CALCIUM 7.3*  --  7.0*  --  6.8*  --   MG  --  1.6*  --  1.6*  --  1.9    CBC: Recent Labs  Lab 01/20/24 1350 01/21/24 0114 01/22/24 0600 01/23/24 0819  WBC 15.4* 12.9* 9.4 7.9  NEUTROABS 12.4* 11.9* 6.9 PENDING  HGB 8.8* 8.1* 8.7* 9.6*  HCT 26.4* 26.3* 26.9* 30.2*  MCV 98.1 104.0* 97.8 100.0  PLT 118* 115* 103* 103*    LFT Recent Labs  Lab 01/20/24 1350 01/22/24 0600  AST 23 20  ALT 25 18  ALKPHOS 82 61  BILITOT 0.4 0.6  PROT 5.4* 4.8*  ALBUMIN 2.8* 2.4*     Antibiotics: Anti-infectives (From admission, onward)    Start     Dose/Rate Route Frequency Ordered Stop    01/22/24 1400  cefTRIAXone (ROCEPHIN) 2 g in sodium chloride 0.9 % 100 mL IVPB        2 g 200 mL/hr over 30 Minutes Intravenous Every 24 hours 01/22/24 1326     01/21/24 1000  cefTRIAXone (ROCEPHIN) 2 g in sodium chloride 0.9 % 100 mL IVPB  Status:  Discontinued        2 g 200 mL/hr over 30 Minutes Intravenous Every 24 hours 01/20/24 2330 01/20/24 2359   01/21/24 0800  doxycycline (VIBRAMYCIN) 100 mg in sodium chloride 0.9 % 250 mL IVPB        100 mg 125 mL/hr over 120 Minutes Intravenous Every 12 hours 01/20/24 2330     01/20/24 1630  cefTRIAXone (ROCEPHIN) 1 g in sodium chloride 0.9 % 100 mL IVPB        1 g 200 mL/hr over 30 Minutes Intravenous  Once 01/20/24 1628 01/20/24 1748   01/20/24 1630  doxycycline (VIBRAMYCIN) 100 mg in sodium chloride 0.9 % 250 mL IVPB        100 mg 125 mL/hr over 120 Minutes Intravenous  Once 01/20/24 1628 01/20/24 2051        DVT prophylaxis: Heparin  Code Status: Full code  Family Communication: No family at bedside   CONSULTS oncology   Subjective   Breathing better today.   Objective    Physical Examination:  General-appears in no acute distress Heart-S1-S2, regular, no murmur auscultated Lungs-decreased breath sounds at lung bases Abdomen-soft, nontender, no organomegaly Extremities-1+ edema in the lower extremities bilaterally Neuro-alert, oriented x3, no focal deficit noted  Status is: Inpatient:             Kristin [name]   [facility] If 7PM-7AM, please contact night-coverage at [email or web address], Office  [phone]   01/23/2024, 8:47 AM  LOS: 2 days

## 2024-01-24 ENCOUNTER — Inpatient Hospital Stay (HOSPITAL_COMMUNITY): Payer: Medicare Other

## 2024-01-24 DIAGNOSIS — R197 Diarrhea, unspecified: Secondary | ICD-10-CM | POA: Diagnosis not present

## 2024-01-24 DIAGNOSIS — D631 Anemia in chronic kidney disease: Secondary | ICD-10-CM | POA: Diagnosis not present

## 2024-01-24 DIAGNOSIS — N1831 Chronic kidney disease, stage 3a: Secondary | ICD-10-CM | POA: Diagnosis not present

## 2024-01-24 DIAGNOSIS — J189 Pneumonia, unspecified organism: Secondary | ICD-10-CM | POA: Diagnosis not present

## 2024-01-24 LAB — CBC WITH DIFFERENTIAL/PLATELET
Abs Immature Granulocytes: 0.49 10*3/uL — ABNORMAL HIGH (ref 0.00–0.07)
Basophils Absolute: 0 10*3/uL (ref 0.0–0.1)
Basophils Relative: 1 %
Eosinophils Absolute: 0.1 10*3/uL (ref 0.0–0.5)
Eosinophils Relative: 1 %
HCT: 26.8 % — ABNORMAL LOW (ref 36.0–46.0)
Hemoglobin: 8.6 g/dL — ABNORMAL LOW (ref 12.0–15.0)
Immature Granulocytes: 7 %
Lymphocytes Relative: 7 %
Lymphs Abs: 0.5 10*3/uL — ABNORMAL LOW (ref 0.7–4.0)
MCH: 32.3 pg (ref 26.0–34.0)
MCHC: 32.1 g/dL (ref 30.0–36.0)
MCV: 100.8 fL — ABNORMAL HIGH (ref 80.0–100.0)
Monocytes Absolute: 0.6 10*3/uL (ref 0.1–1.0)
Monocytes Relative: 10 %
Neutro Abs: 4.9 10*3/uL (ref 1.7–7.7)
Neutrophils Relative %: 74 %
Platelets: 109 10*3/uL — ABNORMAL LOW (ref 150–400)
RBC: 2.66 MIL/uL — ABNORMAL LOW (ref 3.87–5.11)
RDW: 16.7 % — ABNORMAL HIGH (ref 11.5–15.5)
WBC: 6.6 10*3/uL (ref 4.0–10.5)
nRBC: 0.6 % — ABNORMAL HIGH (ref 0.0–0.2)

## 2024-01-24 LAB — COMPREHENSIVE METABOLIC PANEL
ALT: 21 U/L (ref 0–44)
AST: 26 U/L (ref 15–41)
Albumin: 2.2 g/dL — ABNORMAL LOW (ref 3.5–5.0)
Alkaline Phosphatase: 78 U/L (ref 38–126)
Anion gap: 7 (ref 5–15)
BUN: 25 mg/dL — ABNORMAL HIGH (ref 8–23)
CO2: 16 mmol/L — ABNORMAL LOW (ref 22–32)
Calcium: 6.5 mg/dL — ABNORMAL LOW (ref 8.9–10.3)
Chloride: 116 mmol/L — ABNORMAL HIGH (ref 98–111)
Creatinine, Ser: 1.43 mg/dL — ABNORMAL HIGH (ref 0.44–1.00)
GFR, Estimated: 38 mL/min — ABNORMAL LOW (ref >60)
Glucose, Bld: 74 mg/dL (ref 70–99)
Potassium: 3.6 mmol/L (ref 3.5–5.1)
Sodium: 139 mmol/L (ref 135–145)
Total Bilirubin: 0.4 mg/dL (ref 0.0–1.2)
Total Protein: 4.8 g/dL — ABNORMAL LOW (ref 6.5–8.1)

## 2024-01-24 LAB — GLUCOSE, CAPILLARY
Glucose-Capillary: 89 mg/dL (ref 70–99)
Glucose-Capillary: 107 mg/dL — ABNORMAL HIGH (ref 70–99)
Glucose-Capillary: 102 mg/dL — ABNORMAL HIGH (ref 70–99)
Glucose-Capillary: 122 mg/dL — ABNORMAL HIGH (ref 70–99)

## 2024-01-24 MED ORDER — SODIUM BICARBONATE 650 MG PO TABS
650.0000 mg | ORAL_TABLET | Freq: Three times a day (TID) | ORAL | Status: DC
  Administered 2024-01-24 – 2024-01-26 (×7): 650 mg via ORAL
  Filled 2024-01-24 (×7): qty 1

## 2024-01-24 MED ORDER — EPOETIN ALFA 10000 UNIT/ML IJ SOLN
10000.0000 [IU] | Freq: Once | INTRAMUSCULAR | Status: AC
  Administered 2024-01-24: 10000 [IU] via SUBCUTANEOUS
  Filled 2024-01-24: qty 1

## 2024-01-24 MED ORDER — DOXYCYCLINE HYCLATE 100 MG PO TABS
100.0000 mg | ORAL_TABLET | Freq: Two times a day (BID) | ORAL | Status: DC
  Administered 2024-01-24 – 2024-01-26 (×5): 100 mg via ORAL
  Filled 2024-01-24 (×5): qty 1

## 2024-01-24 MED ORDER — EPOETIN ALFA 40000 UNIT/ML IJ SOLN: 40000.0000 [IU] | Freq: Once | INTRAMUSCULAR | Status: DC

## 2024-01-24 NOTE — Plan of Care (Signed)
  Problem: Education: Goal: Knowledge of General Education information will improve Description: Including pain rating scale, medication(s)/side effects and non-pharmacologic comfort measures Outcome: Progressing   Problem: Clinical Measurements: Goal: Ability to maintain clinical measurements within normal limits will improve Outcome: Progressing Goal: Respiratory complications will improve Outcome: Progressing   Problem: Activity: Goal: Risk for activity intolerance will decrease Outcome: Progressing   Problem: Nutrition: Goal: Adequate nutrition will be maintained Outcome: Progressing   Problem: Pain Managment: Goal: General experience of comfort will improve and/or be controlled Outcome: Progressing   Problem: Safety: Goal: Ability to remain free from injury will improve Outcome: Progressing

## 2024-01-24 NOTE — Progress Notes (Signed)
 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  She has been very weak, in bed since Wednesday because of diarrhea.  Today she tried to get to the shower, she was so weak patient brought to the ER.  She endorses low-grade fever starting today.  Positive shortness of breath no cough.  No nausea or vomiting.  Poor appetite.  Her antidiarrheal became effective and her diarrhea has decreased.   In the ER patient is hemodynamically stable but tachycardic.  Creatinine at baseline 1.86.  WBC 15.4.  Respiratory panel negative.  CXR: LLL pneumonia      Assessment/Plan:     Community acquired pneumonia -Started on Rocephin and Zithromax -Patient immunocompromised, on chemotherapy  Acute on chronic diastolic CHF -Diuresed well with IV Lasix -Creatinine now rising, will hold further doses of Lasix     Diarrhea -GI pathogen panel ordered -Diarrhea has resolved with Imodium   Anemia -Likely due to CKD and chemotherapy -Hemoglobin trending down from 12 =>10>8.8> 8.7 -Anemia panel showed iron 25, 11% saturation, ferritin 239, folate 9.2, B12 4672 -Dose of Epogen ordered per oncology  Hyperchloremic metabolic acidosis -Start bicarb tablets 650 mg p.o. 3 times daily -Follow BMP in am    HLD (hyperlipidemia) -Atorvastatin    HTN (hypertension) -Metoprolol, hydralazine, Imdur with hold parameters -Hold lisinopril with dehydration     Hypothyroidism -Synthroid resumed    OSA (obstructive sleep apnea) -CPAP ordered    T2DM -Sliding scale insulin.     QT prolongation-  Breast cancer (HCC) -On chemotherapy q3wk. Recurrent breast ca -1992 lumpectomy. No nodes. Chemo/xrt.  Nothing until now -Serum magnesium was 1.6, replaced -Serum magnesium 1.9   Medications     sodium chloride   Intravenous Once   aspirin EC  81 mg Oral QODAY   atorvastatin  40 mg Oral QHS   Chlorhexidine Gluconate Cloth  6 each Topical Daily   epoetin alfa  10,000 Units Subcutaneous Once   famotidine  20 mg Oral Daily   gabapentin  600 mg Oral QHS   heparin  5,000 Units Subcutaneous Q8H   insulin aspart  0-9 Units Subcutaneous TID WC   levothyroxine  75 mcg Oral Q0600   metoprolol succinate  50 mg Oral BID   sodium chloride flush  10-40 mL Intracatheter Q12H     Data Reviewed:   CBG:  Recent Labs  Lab 01/23/24 0744 01/23/24 1205 01/23/24 1633 01/23/24 2106 01/24/24 0742  GLUCAP 101* 87 96 108* 89    SpO2: 95 % O2 Flow Rate (L/min): 3 L/min    Vitals:   01/23/24 0511 01/23/24 1052 01/23/24 2011 01/24/24 0523  BP: 131/72 133/73 133/81 (!) 131/90  Pulse: 93 90 95 92  Resp: 17  18 18   Temp: 98.9 F (37.2 C)  98.9 F (37.2 C) 97.6 F (36.4 C)  TempSrc: Oral     SpO2: 95%  97% 95%  Weight:      Height:          Data Reviewed:  Basic Metabolic Panel: Recent Labs  Lab 01/20/24 1350 01/20/24 1410 01/21/24 0114 01/21/24 1810 01/22/24 0600 01/22/24 0608 01/23/24 0819 01/24/24 0306  NA 134*  --  131*  --  137  --  134* 139  K 4.2  --  4.5  --  4.1  --  3.3* 3.6  CL 110  --  105  --  111  --  108 116*  CO2 15*  --  15*  --  18*  --  16* 16*  GLUCOSE 158*  --  123*  --  113*  --  118* 74  BUN 43*  --  39*  --  31*  --  25* 25*  CREATININE 1.86*  --  1.79* 1.66* 1.54*  --  1.36* 1.43*  CALCIUM 7.3*  --  7.0*  --  6.8*  --  6.7* 6.5*  MG  --  1.6*  --  1.6*  --  1.9  --   --     CBC: Recent Labs  Lab 01/20/24 1350 01/21/24 0114 01/22/24 0600 01/23/24 0819 01/24/24 0306  WBC 15.4* 12.9* 9.4 7.9 6.6  NEUTROABS 12.4* 11.9* 6.9 6.2 4.9  HGB 8.8* 8.1* 8.7* 9.6* 8.6*  HCT 26.4* 26.3* 26.9* 30.2* 26.8*  MCV 98.1 104.0* 97.8 100.0 100.8*  PLT  118* 115* 103* 103* 109*    LFT Recent Labs  Lab 01/20/24 1350 01/22/24 0600 01/23/24 0819 01/24/24 0306  AST 23 20 25 26   ALT 25 18 20 21   ALKPHOS 82 61 76 78  BILITOT 0.4 0.6 0.5 0.4  PROT 5.4* 4.8* 5.2* 4.8*  ALBUMIN 2.8* 2.4* 2.4* 2.2*     Antibiotics: Anti-infectives (From admission, onward)    Start     Dose/Rate Route Frequency Ordered Stop   01/22/24 1400  cefTRIAXone (ROCEPHIN) 2 g in sodium chloride 0.9 % 100 mL IVPB        2 g 200 mL/hr over 30 Minutes Intravenous Every 24 hours 01/22/24 1326     01/21/24 1000  cefTRIAXone (ROCEPHIN) 2 g in sodium chloride 0.9 % 100 mL IVPB  Status:  Discontinued        2 g 200 mL/hr over 30 Minutes Intravenous Every 24 hours 01/20/24 2330 01/20/24 2359   01/21/24 0800  doxycycline (VIBRAMYCIN) 100 mg in sodium chloride 0.9 % 250 mL IVPB        100 mg 125 mL/hr over 120 Minutes Intravenous Every 12 hours 01/20/24 2330     01/20/24 1630  cefTRIAXone (ROCEPHIN) 1 g in sodium chloride 0.9 % 100 mL IVPB        1 g 200 mL/hr over 30 Minutes Intravenous  Once 01/20/24 1628 01/20/24 1748   01/20/24 1630  doxycycline (VIBRAMYCIN) 100 mg in sodium chloride 0.9 % 250 mL IVPB        100 mg 125 mL/hr over 120 Minutes Intravenous  Once 01/20/24 1628 01/20/24 2051        DVT prophylaxis: Heparin  Code Status: Full code  Family Communication: No family at bedside   CONSULTS oncology   Subjective   Patient says that she feels more fatigued today.  Denies shortness of breath.  Diarrhea has resolved   Objective    Physical Examination:  Appears in no acute distress S1-S2, regular Lungs clear to auscultation bilaterally Abdomen soft, nontender, no organomegaly  Status is: Inpatient:             Kristin [name]   [facility] If 7PM-7AM, please contact night-coverage at [email or web address], Office  [phone]   01/24/2024, 8:16 AM  LOS: 3 days

## 2024-01-24 NOTE — Plan of Care (Signed)

## 2024-01-24 NOTE — Progress Notes (Signed)
 Kristin [name] says she feels a bit more short of breath today.  I am sure some of this is probably from her anemia.  I am little surprised that she has a drop in her hemoglobin.  Yesterday, hemoglobin was 9.6.  She dose of Procrit.  Today, hemoglobin is 8.6.  Her white cell count 6.6.  Platelet count 109,000.  She is not bleeding.  She may have had some diarrhea.  She has gotten iron this admission.  She is gotten transfused this admission.  I will give her another dose of Procrit.  She really wants to have another chest x-ray.  She has a little bit of a cough.  She does use a CPAP machine at night.  There is been no fever.  Her appetite has been okay.  She has had no nausea or vomiting.  Her vital signs show temperature of 97.6.  Pulse 92.  Blood pressure 131/90.  Her lungs sound pretty clear bilaterally.  I do not hear any wheezing.  She has good air movement bilaterally.  Cardiac exam regular rate and rhythm.  Abdomen is obese but soft.  Bowel sounds are present.  There is no guarding or rebound tenderness.  Extremities shows no clubbing, cyanosis or edema.  Neurological exam is nonfocal.  Again, not sure why her hemoglobin is dropped.  Again she is not bleeding.  We will see about a dose of Procrit.  Overall, if it was not for a drop in her hemoglobin, I would think that she would be ready to go home.  I just worry that this may drop more.  I would like to think that if everything is stable today and her labs look okay tomorrow then she can go home.  I know this is complicated.  I know she is getting incredible care from everybody on [facility].  I appreciate the compassion of the staff.   [name], MD  Romans 8:28

## 2024-01-25 DIAGNOSIS — C50919 Malignant neoplasm of unspecified site of unspecified female breast: Secondary | ICD-10-CM | POA: Diagnosis not present

## 2024-01-25 DIAGNOSIS — J189 Pneumonia, unspecified organism: Secondary | ICD-10-CM | POA: Diagnosis not present

## 2024-01-25 DIAGNOSIS — R197 Diarrhea, unspecified: Secondary | ICD-10-CM | POA: Diagnosis not present

## 2024-01-25 LAB — GASTROINTESTINAL PANEL BY PCR, STOOL (REPLACES STOOL CULTURE)

## 2024-01-25 LAB — COMPREHENSIVE METABOLIC PANEL
ALT: 22 U/L (ref 0–44)
AST: 24 U/L (ref 15–41)
Albumin: 2.3 g/dL — ABNORMAL LOW (ref 3.5–5.0)
Alkaline Phosphatase: 76 U/L (ref 38–126)
Anion gap: 9 (ref 5–15)
BUN: 28 mg/dL — ABNORMAL HIGH (ref 8–23)
CO2: 19 mmol/L — ABNORMAL LOW (ref 22–32)
Calcium: 6.9 mg/dL — ABNORMAL LOW (ref 8.9–10.3)
Chloride: 111 mmol/L (ref 98–111)
Creatinine, Ser: 1.18 mg/dL — ABNORMAL HIGH (ref 0.44–1.00)
GFR, Estimated: 47 mL/min — ABNORMAL LOW (ref >60)
Glucose, Bld: 86 mg/dL (ref 70–99)
Potassium: 4 mmol/L (ref 3.5–5.1)
Sodium: 139 mmol/L (ref 135–145)
Total Bilirubin: 0.5 mg/dL (ref 0.0–1.2)
Total Protein: 4.8 g/dL — ABNORMAL LOW (ref 6.5–8.1)

## 2024-01-25 LAB — CBC WITH DIFFERENTIAL/PLATELET
Abs Immature Granulocytes: 0.36 10*3/uL — ABNORMAL HIGH (ref 0.00–0.07)
Basophils Absolute: 0 10*3/uL (ref 0.0–0.1)
Basophils Relative: 1 %
Eosinophils Absolute: 0.1 10*3/uL (ref 0.0–0.5)
Eosinophils Relative: 1 %
HCT: 28.3 % — ABNORMAL LOW (ref 36.0–46.0)
Hemoglobin: 9.3 g/dL — ABNORMAL LOW (ref 12.0–15.0)
Immature Granulocytes: 6 %
Lymphocytes Relative: 9 %
Lymphs Abs: 0.5 10*3/uL — ABNORMAL LOW (ref 0.7–4.0)
MCH: 32.3 pg (ref 26.0–34.0)
MCHC: 32.9 g/dL (ref 30.0–36.0)
MCV: 98.3 fL (ref 80.0–100.0)
Monocytes Absolute: 0.7 10*3/uL (ref 0.1–1.0)
Monocytes Relative: 11 %
Neutro Abs: 4.3 10*3/uL (ref 1.7–7.7)
Neutrophils Relative %: 72 %
Platelets: 122 10*3/uL — ABNORMAL LOW (ref 150–400)
RBC: 2.88 MIL/uL — ABNORMAL LOW (ref 3.87–5.11)
RDW: 16.2 % — ABNORMAL HIGH (ref 11.5–15.5)
WBC: 6 10*3/uL (ref 4.0–10.5)
nRBC: 0 % (ref 0.0–0.2)

## 2024-01-25 LAB — GLUCOSE, CAPILLARY
Glucose-Capillary: 88 mg/dL (ref 70–99)
Glucose-Capillary: 109 mg/dL — ABNORMAL HIGH (ref 70–99)
Glucose-Capillary: 87 mg/dL (ref 70–99)
Glucose-Capillary: 109 mg/dL — ABNORMAL HIGH (ref 70–99)

## 2024-01-25 MED ORDER — FUROSEMIDE 20 MG PO TABS
20.0000 mg | ORAL_TABLET | Freq: Every day | ORAL | Status: DC
  Administered 2024-01-25 – 2024-01-26 (×2): 20 mg via ORAL
  Filled 2024-01-25 (×2): qty 1

## 2024-01-25 NOTE — Progress Notes (Signed)
 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  She has been very weak, in bed since Wednesday because of diarrhea.  Today she tried to get to the shower, she was so weak patient brought to the ER.  She endorses low-grade fever starting today.  Positive shortness of breath no cough.  No nausea or vomiting.  Poor appetite.  Her antidiarrheal became effective and her diarrhea has decreased.   In the ER patient is hemodynamically stable but tachycardic.  Creatinine at baseline 1.86.  WBC 15.4.  Respiratory panel negative.  CXR: LLL pneumonia      Assessment/Plan:     Community acquired pneumonia -Started on Rocephin and Zithromax -Patient immunocompromised, on chemotherapy  Acute on chronic diastolic CHF -Diuresed well with IV Lasix -Will restart Lasix at low-dose of 20 mg p.o. daily     Diarrhea -GI pathogen panel ordered; result negative -Diarrhea has resolved with Imodium   Anemia -Likely due to CKD and chemotherapy -Hemoglobin trending down from 12 =>10>8.8> 8.7> 9.3 -Anemia panel showed iron 25, 11% saturation, ferritin 239, folate 9.2, B12 4672 -Dose of Epogen ordered per oncology  Hyperchloremic metabolic acidosis -Start bicarb tablets 650 mg p.o. 3 times daily -Follow BMP in am    HLD (hyperlipidemia) -Atorvastatin    HTN (hypertension) -Metoprolol, hydralazine, Imdur with hold parameters -Hold lisinopril with dehydration     Hypothyroidism -Synthroid resumed    OSA (obstructive sleep apnea) -CPAP ordered    T2DM -Sliding scale insulin.     QT prolongation-  Breast cancer (HCC) -On chemotherapy q3wk. Recurrent breast ca -1992 lumpectomy. No  nodes. Chemo/xrt. Nothing until now -Serum magnesium was 1.6, replaced -Serum magnesium 1.9   Medications     aspirin EC  81 mg Oral QODAY   atorvastatin  40 mg Oral QHS   Chlorhexidine Gluconate Cloth  6 each Topical Daily   doxycycline  100 mg Oral Q12H   famotidine  20 mg Oral Daily   gabapentin  600 mg Oral QHS   heparin  5,000 Units Subcutaneous Q8H   insulin aspart  0-9 Units Subcutaneous TID WC   levothyroxine  75 mcg Oral Q0600   metoprolol succinate  50 mg Oral BID   sodium bicarbonate  650 mg Oral TID   sodium chloride flush  10-40 mL Intracatheter Q12H     Data Reviewed:   CBG:  Recent Labs  Lab 01/24/24 0742 01/24/24 1234 01/24/24 1720 01/24/24 2148 01/25/24 0807  GLUCAP 89 107* 102* 122* 88    SpO2: 96 % O2 Flow Rate (L/min): 3 L/min    Vitals:   01/24/24 0523 01/24/24 1236 01/24/24 2045 01/25/24 0443  BP: (!) 131/90 133/80 134/85 118/68  Pulse: 92 95 (!) 103 90  Resp: 18 20 18 18   Temp: 97.6 F (36.4 C) 98.5 F (36.9 C) 98.6 F (37 C) 98.2 F (36.8 C)  TempSrc:   Oral Oral  SpO2: 95% 96% 97% 96%  Weight:      Height:          Data Reviewed:  Basic Metabolic Panel: Recent Labs  Lab 01/20/24 1410 01/21/24 0114 01/21/24 1810 01/22/24 0600 01/22/24 0608 01/23/24 0819 01/24/24 0306 01/25/24 0320  NA  --  131*  --  137  --  134* 139 139  K  --  4.5  --  4.1  --  3.3* 3.6 4.0  CL  --  105  --  111  --  108 116* 111  CO2  --  15*  --  18*  --  16* 16* 19*  GLUCOSE  --  123*  --  113*  --  118* 74 86  BUN  --  39*  --  31*  --  25* 25* 28*  CREATININE  --  1.79* 1.66* 1.54*  --  1.36* 1.43* 1.18*  CALCIUM  --  7.0*  --  6.8*  --  6.7* 6.5* 6.9*  MG 1.6*  --  1.6*  --  1.9  --   --   --     CBC: Recent Labs  Lab 01/21/24 0114 01/22/24 0600 01/23/24 0819 01/24/24 0306 01/25/24 0320  WBC 12.9* 9.4 7.9 6.6 6.0  NEUTROABS 11.9* 6.9 6.2 4.9 4.3  HGB 8.1* 8.7* 9.6* 8.6* 9.3*  HCT 26.3* 26.9* 30.2* 26.8* 28.3*  MCV 104.0* 97.8  100.0 100.8* 98.3  PLT 115* 103* 103* 109* 122*    LFT Recent Labs  Lab 01/20/24 1350 01/22/24 0600 01/23/24 0819 01/24/24 0306 01/25/24 0320  AST 23 20 25 26 24   ALT 25 18 20 21 22   ALKPHOS 82 61 76 78 76  BILITOT 0.4 0.6 0.5 0.4 0.5  PROT 5.4* 4.8* 5.2* 4.8* 4.8*  ALBUMIN 2.8* 2.4* 2.4* 2.2* 2.3*     Antibiotics: Anti-infectives (From admission, onward)    Start     Dose/Rate Route Frequency Ordered Stop   01/24/24 1130  doxycycline (VIBRA-TABS) tablet 100 mg        100 mg Oral Every 12 hours 01/24/24 1033     01/22/24 1400  cefTRIAXone (ROCEPHIN) 2 g in sodium chloride 0.9 % 100 mL IVPB        2 g 200 mL/hr over 30 Minutes Intravenous Every 24 hours 01/22/24 1326     01/21/24 1000  cefTRIAXone (ROCEPHIN) 2 g in sodium chloride 0.9 % 100 mL IVPB  Status:  Discontinued        2 g 200 mL/hr over 30 Minutes Intravenous Every 24 hours 01/20/24 2330 01/20/24 2359   01/21/24 0800  doxycycline (VIBRAMYCIN) 100 mg in sodium chloride 0.9 % 250 mL IVPB  Status:  Discontinued        100 mg 125 mL/hr over 120 Minutes Intravenous Every 12 hours 01/20/24 2330 01/24/24 1033   01/20/24 1630  cefTRIAXone (ROCEPHIN) 1 g in sodium chloride 0.9 % 100 mL IVPB        1 g 200 mL/hr over 30 Minutes Intravenous  Once 01/20/24 1628 01/20/24 1748   01/20/24 1630  doxycycline (VIBRAMYCIN) 100 mg in sodium chloride 0.9 % 250 mL IVPB        100 mg 125 mL/hr over 120 Minutes Intravenous  Once 01/20/24 1628 01/20/24 2051        DVT prophylaxis: Heparin  Code Status: Full code  Family Communication: No family at bedside   CONSULTS oncology   Subjective   Denies shortness of breath.  Diarrhea has resolved.  GI pathogen panel negative   Objective    Physical Examination:  General-appears in no acute distress Heart-S1-S2, regular, no murmur auscultated Lungs-clear to auscultation bilaterally, no wheezing or crackles auscultated Abdomen-soft, nontender, no  organomegaly Extremities-bilateral 1+  edema in the lower extremities Neuro-alert, oriented x3,  no focal deficit noted  Status is: Inpatient:             [name]   [facility] If 7PM-7AM, please contact night-coverage at [email or web address], Office  [phone]   01/25/2024, 8:46 AM  LOS: 4 days

## 2024-01-25 NOTE — Plan of Care (Signed)
  Problem: Education: Goal: Knowledge of General Education information will improve Description: Including pain rating scale, medication(s)/side effects and non-pharmacologic comfort measures Outcome: Progressing   Problem: Clinical Measurements: Goal: Ability to maintain clinical measurements within normal limits will improve Outcome: Progressing Goal: Will remain free from infection Outcome: Progressing   Problem: Nutrition: Goal: Adequate nutrition will be maintained Outcome: Progressing   Problem: Pain Managment: Goal: General experience of comfort will improve and/or be controlled Outcome: Progressing   Problem: Safety: Goal: Ability to remain free from injury will improve Outcome: Progressing   Problem: Activity: Goal: Ability to tolerate increased activity will improve Outcome: Progressing   Problem: Metabolic: Goal: Ability to maintain appropriate glucose levels will improve Outcome: Progressing

## 2024-01-25 NOTE — Progress Notes (Signed)
 Mobility Specialist - Progress Note   01/25/24 1307  Mobility  Activity Ambulated independently in hallway  Level of Assistance Independent  Assistive Device None  Distance Ambulated (ft) 250 ft  Activity Response Tolerated well  Mobility Referral Yes  Mobility visit 1 Mobility  Mobility Specialist Start Time (ACUTE ONLY) 1259  Mobility Specialist Stop Time (ACUTE ONLY) 1307  Mobility Specialist Time Calculation (min) (ACUTE ONLY) 8 min   Pt received in recliner and agreeable to mobility. Pt took x2 standing rest breaks d/t fatigue. No complaints during session. Pt to recliner for meal after session with all needs met.    [facility]

## 2024-01-25 NOTE — Plan of Care (Signed)
  Problem: Education: Goal: Knowledge of General Education information will improve Description: Including pain rating scale, medication(s)/side effects and non-pharmacologic comfort measures Outcome: Progressing   Problem: Health Behavior/Discharge Planning: Goal: Ability to manage health-related needs will improve Outcome: Progressing   Problem: Clinical Measurements: Goal: Ability to maintain clinical measurements within normal limits will improve Outcome: Progressing Goal: Will remain free from infection Outcome: Progressing Goal: Diagnostic test results will improve Outcome: Progressing   Problem: Activity: Goal: Risk for activity intolerance will decrease Outcome: Progressing   Problem: Nutrition: Goal: Adequate nutrition will be maintained Outcome: Progressing   Problem: Coping: Goal: Level of anxiety will decrease Outcome: Progressing   Problem: Elimination: Goal: Will not experience complications related to bowel motility Outcome: Progressing Goal: Will not experience complications related to urinary retention Outcome: Progressing   Problem: Pain Managment: Goal: General experience of comfort will improve and/or be controlled Outcome: Progressing   Problem: Skin Integrity: Goal: Risk for impaired skin integrity will decrease Outcome: Progressing   Problem: Activity: Goal: Ability to tolerate increased activity will improve Outcome: Progressing   Problem: Coping: Goal: Ability to adjust to condition or change in health will improve Outcome: Progressing   Problem: Metabolic: Goal: Ability to maintain appropriate glucose levels will improve Outcome: Progressing   Problem: Nutritional: Goal: Maintenance of adequate nutrition will improve Outcome: Progressing   Problem: Tissue Perfusion: Goal: Adequacy of tissue perfusion will improve Outcome: Progressing

## 2024-01-26 DIAGNOSIS — R197 Diarrhea, unspecified: Secondary | ICD-10-CM | POA: Diagnosis not present

## 2024-01-26 DIAGNOSIS — C50919 Malignant neoplasm of unspecified site of unspecified female breast: Secondary | ICD-10-CM | POA: Diagnosis not present

## 2024-01-26 DIAGNOSIS — J189 Pneumonia, unspecified organism: Secondary | ICD-10-CM | POA: Diagnosis not present

## 2024-01-26 DIAGNOSIS — D649 Anemia, unspecified: Secondary | ICD-10-CM | POA: Diagnosis not present

## 2024-01-26 LAB — COMPREHENSIVE METABOLIC PANEL
ALT: 23 U/L (ref 0–44)
AST: 26 U/L (ref 15–41)
Albumin: 2.4 g/dL — ABNORMAL LOW (ref 3.5–5.0)
Alkaline Phosphatase: 77 U/L (ref 38–126)
Anion gap: 10 (ref 5–15)
BUN: 29 mg/dL — ABNORMAL HIGH (ref 8–23)
CO2: 17 mmol/L — ABNORMAL LOW (ref 22–32)
Calcium: 6.9 mg/dL — ABNORMAL LOW (ref 8.9–10.3)
Chloride: 110 mmol/L (ref 98–111)
Creatinine, Ser: 1.48 mg/dL — ABNORMAL HIGH (ref 0.44–1.00)
GFR, Estimated: 36 mL/min — ABNORMAL LOW (ref >60)
Glucose, Bld: 83 mg/dL (ref 70–99)
Potassium: 3.8 mmol/L (ref 3.5–5.1)
Sodium: 137 mmol/L (ref 135–145)
Total Bilirubin: 0.4 mg/dL (ref 0.0–1.2)
Total Protein: 5.1 g/dL — ABNORMAL LOW (ref 6.5–8.1)

## 2024-01-26 LAB — CULTURE, BLOOD (ROUTINE X 2)
Culture: NO GROWTH
Culture: NO GROWTH

## 2024-01-26 LAB — CBC WITH DIFFERENTIAL/PLATELET
Abs Immature Granulocytes: 0.27 10*3/uL — ABNORMAL HIGH (ref 0.00–0.07)
Basophils Absolute: 0 10*3/uL (ref 0.0–0.1)
Basophils Relative: 1 %
Eosinophils Absolute: 0.1 10*3/uL (ref 0.0–0.5)
Eosinophils Relative: 2 %
HCT: 29 % — ABNORMAL LOW (ref 36.0–46.0)
Hemoglobin: 9.4 g/dL — ABNORMAL LOW (ref 12.0–15.0)
Immature Granulocytes: 5 %
Lymphocytes Relative: 11 %
Lymphs Abs: 0.7 10*3/uL (ref 0.7–4.0)
MCH: 31.8 pg (ref 26.0–34.0)
MCHC: 32.4 g/dL (ref 30.0–36.0)
MCV: 98 fL (ref 80.0–100.0)
Monocytes Absolute: 0.6 10*3/uL (ref 0.1–1.0)
Monocytes Relative: 11 %
Neutro Abs: 4.2 10*3/uL (ref 1.7–7.7)
Neutrophils Relative %: 70 %
Platelets: 156 10*3/uL (ref 150–400)
RBC: 2.96 MIL/uL — ABNORMAL LOW (ref 3.87–5.11)
RDW: 16.2 % — ABNORMAL HIGH (ref 11.5–15.5)
WBC: 5.9 10*3/uL (ref 4.0–10.5)
nRBC: 0 % (ref 0.0–0.2)

## 2024-01-26 LAB — GLUCOSE, CAPILLARY: Glucose-Capillary: 89 mg/dL (ref 70–99)

## 2024-01-26 MED ORDER — GUAIFENESIN 100 MG/5ML PO LIQD: 5.0000 mL | ORAL | 0 refills | Status: DC | PRN

## 2024-01-26 MED ORDER — SODIUM BICARBONATE 650 MG PO TABS: 650.0000 mg | ORAL_TABLET | Freq: Two times a day (BID) | ORAL | 0 refills | Status: AC

## 2024-01-26 MED ORDER — HEPARIN SOD (PORK) LOCK FLUSH 100 UNIT/ML IV SOLN
500.0000 [IU] | INTRAVENOUS | Status: AC | PRN
  Administered 2024-01-26: 500 [IU]

## 2024-01-26 MED ORDER — FUROSEMIDE 20 MG PO TABS: 20.0000 mg | ORAL_TABLET | Freq: Every day | ORAL | 1 refills | Status: AC | PRN

## 2024-01-26 NOTE — Care Management Important Message (Signed)
 Important Message  Patient Details IM Letter given. Name: Kristin [name] MRN: [id number] Date of Birth: [birth year]   Important Message Given:  Yes - Medicare IM     Caren Macadam 01/26/2024, 12:13 PM

## 2024-01-26 NOTE — Progress Notes (Signed)
 Kristin [name] had a good weekend.  She feels okay.  She had a chest x-ray which really did not show any kind of infiltrate.  She is eating okay.  She is having no problems with fever.  There is no diarrhea.  She got some Lasix yesterday.  Her legs are not as swollen.  Her labs show sodium 137.  Potassium 3.8.  BUN 29 creatinine 1.48.  Calcium 6.9 with an albumin of 2.4.  Her white cell count is 5.9.  Hemoglobin 9.4.  Platelet count 156,000.  She has had no bleeding.  There has been no mouth sores.  She is eating okay.  Her vital signs show temperature of 98.1.  Pulse 87.  Blood pressure 130/78.  Her lungs sound pretty clear bilaterally.  I do not hear any wheezing.  She has good air movement bilaterally.  Cardiac exam regular rate and rhythm with no murmurs rubs or bruits.  Abdomen is soft.  Bowel sounds are present.  She has no fluid wave.  There is no palpable liver or spleen tip.  Extremity shows maybe some mild edema in the left lower leg.  She has good strength in her extremities.  Neurological exam is nonfocal.  Hopefully, [name] will be able to go home today.  She would like to go home today.  We will put her chemotherapy on hold for 1 more week.  I really think she needs to have a couple weeks to recover from being in the hospital.  I talked to her about this.  She understands and she agrees with this.  I know that she has had incredible care from everybody upon 5 E.  I do appreciate everybody's help.   [name], MD  Psalms 46:10

## 2024-01-26 NOTE — Discharge Summary (Signed)
 Physician Discharge Summary   Patient: Kristin [name] MRN: [id number] DOB: Sep 15, 1946  Admit date:     01/20/2024  Discharge date: 01/26/24  Discharge Physician: [name]   PCP: Bill Salinas I, NP   Recommendations at discharge:   Follow-up PCP in 1 week  Discharge Diagnoses: Principal Problem:   Pneumonia of left lower lobe due to infectious organism Active Problems:   Anemia   T2DM (type 2 diabetes mellitus) (HCC)   QT prolongation   Diarrhea   HTN (hypertension)   HLD (hyperlipidemia)   Hypothyroidism   OSA (obstructive sleep apnea)   Breast cancer (HCC)   CAP (community acquired pneumonia)  Resolved Problems:   * No resolved hospital problems. 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  She has been very weak, in bed since Wednesday because of diarrhea.  Today she tried to get to the shower, she was so weak patient brought to the ER.  She endorses low-grade fever starting today.  Positive shortness of breath no cough.  No nausea or vomiting.  Poor appetite.  Her antidiarrheal became effective and her diarrhea has decreased.   In the ER patient is hemodynamically stable but tachycardic.  Creatinine at baseline 1.86.  WBC 15.4.  Respiratory panel negative.  CXR: LLL pneumonia  Assessment and Plan:  Community acquired pneumonia -Significantly improved, no longer requiring oxygen -Started on Rocephin and Zithromax -Patient immunocompromised, on chemotherapy   Acute on chronic diastolic CHF -Diuresed well with IV Lasix -Will restart Lasix at low-dose of 20 mg p.o. daily as needed      Diarrhea -GI pathogen panel ordered; result negative -Diarrhea has resolved with Imodium    Anemia -Likely due to CKD and chemotherapy -Hemoglobin trending down from 12  =>10>8.8> 8.7> 9.3 -Anemia panel showed iron 25, 11% saturation, ferritin 239, folate 9.2, B12 4672 -Dose of Epogen ordered per oncology   Hyperchloremic metabolic acidosis -Start bicarb tablets 650 mg p.o. 2 times daily     HLD (hyperlipidemia) -Atorvastatin    HTN (hypertension) --Continue home regimen      Hypothyroidism -Synthroid resumed    OSA (obstructive sleep apnea) -CPAP ordered    T2DM -Continue home regimen      QT prolongation-  Breast cancer (HCC) -On chemotherapy q3wk. Recurrent breast ca -1992 lumpectomy. No nodes. Chemo/xrt. Nothing until now -Serum magnesium was 1.6, replaced -Serum magnesium 1.9          Consultants:  Procedures performed:  Disposition: Home Diet recommendation:  Discharge Diet Orders (From admission, onward)     Start     Ordered   01/26/24 0000  Diet - low sodium heart healthy        01/26/24 1108           Regular diet DISCHARGE MEDICATION: Allergies as of 01/26/2024       Reactions   Ibuprofen Other (See Comments)   Does not take due to kidney function    Levodopa Other (See Comments)   Increase of dopamine levels   Oxycodone Other (See Comments)   Pt cannot tolerate oxycodone- makes her "loopy"/ AMS        Medication List     STOP taking these medications    levofloxacin 250 MG tablet Commonly known as: Levaquin       TAKE these medications  ALPRAZolam 0.25 MG tablet Commonly known as: XANAX Take 0.25 mg by mouth as needed for anxiety.   aspirin EC 81 MG tablet Take 81 mg by mouth every other day. In the morning Swallow whole.   atorvastatin 40 MG tablet Commonly known as: LIPITOR Take 40 mg by mouth at bedtime.   Calcium 600 600 MG Tabs tablet Generic drug: calcium carbonate Take 600 mg by mouth 2 (two) times daily with a meal.   colestipol 1 g tablet Commonly known as: COLESTID Take 1 g by mouth daily as needed (diarrhea).   cyanocobalamin 1000 MCG tablet Commonly known as:  VITAMIN B12 Take 1,000 mcg by mouth in the morning.   dexamethasone 4 MG tablet Commonly known as: DECADRON Take 2 tabs by mouth 2 times daily starting day before chemo. Then take 2 tabs daily for 2 days starting day after chemo. Take with food. What changed:  how much to take how to take this when to take this additional instructions   famotidine 20 MG tablet Commonly known as: PEPCID Take 20 mg by mouth 2 (two) times daily.   ferrous sulfate 325 (65 FE) MG tablet Take 325 mg by mouth in the morning and at bedtime.   Fish Oil 1000 MG Caps Take 1,000 mg by mouth at bedtime.   fluticasone 50 MCG/ACT nasal spray Commonly known as: FLONASE Place 1 spray into both nostrils daily as needed for allergies or rhinitis.   furosemide 20 MG tablet Commonly known as: Lasix Take 1 tablet (20 mg total) by mouth daily as needed for edema.   gabapentin 300 MG capsule Commonly known as: NEURONTIN Take 600 mg by mouth at bedtime.   guaiFENesin 100 MG/5ML liquid Commonly known as: ROBITUSSIN Take 5 mLs by mouth every 4 (four) hours as needed for cough or to loosen phlegm.   hydrALAZINE 25 MG tablet Commonly known as: APRESOLINE Take 75 mg by mouth in the morning and at bedtime.   isosorbide mononitrate 30 MG 24 hr tablet Commonly known as: IMDUR Take 30 mg by mouth in the morning.   Jardiance 10 MG Tabs tablet Generic drug: empagliflozin Take 10 mg by mouth in the morning.   lidocaine-prilocaine cream Commonly known as: EMLA Apply a dime size of cream to port-a-cath 1-2 hours prior to access. Cover with [employer].   lisinopril 20 MG tablet Commonly known as: ZESTRIL Take 20 mg by mouth in the morning.   MAGNESIUM GLYCINATE PO Take 400 mg by mouth in the morning and at bedtime.   Melatonin 10 MG Tabs Take 10 mg by mouth at bedtime.   metoprolol succinate 50 MG 24 hr tablet Commonly known as: TOPROL-XL Take 50 mg by mouth 2 (two) times daily.   multivitamin with  minerals Tabs tablet Take 1 tablet by mouth at bedtime.   niacin 500 MG tablet Commonly known as: (VITAMIN B3) Take 500 mg by mouth 3 (three) times a week.   ondansetron 8 MG tablet Commonly known as: Zofran Take 1 tablet (8 mg total) by mouth every 8 (eight) hours as needed for nausea or vomiting. Start on the third day after chemotherapy.   prochlorperazine 10 MG tablet Commonly known as: COMPAZINE Take 1 tablet (10 mg total) by mouth every 6 (six) hours as needed for nausea or vomiting.   sodium bicarbonate 650 MG tablet Take 1 tablet (650 mg total) by mouth 2 (two) times daily for 60 doses.   Synthroid 75 MCG tablet Generic drug: levothyroxine Take  75 mcg by mouth daily before breakfast.   Systane 0.4-0.3 % Soln Generic drug: Polyethyl Glycol-Propyl Glycol Place 1 drop into both eyes daily as needed (dry/irritated eyes.).   Trulicity 4.5 MG/0.5ML Soaj Generic drug: Dulaglutide Inject 4.5 mg into the skin once a week. Sunday   VITAMIN K2-VITAMIN D3 PO Take 1 tablet by mouth 2 (two) times a week. Tuesdays and Fridays   zolpidem 12.5 MG CR tablet Commonly known as: AMBIEN CR Take 12.5 mg by mouth at bedtime as needed for sleep.        Discharge Exam: Filed Weights   01/20/24 1259 01/21/24 0523  Weight: 102.1 kg 103 kg   General-appears in no acute distress Heart-S1-S2, regular, no murmur auscultated Lungs-clear to auscultation bilaterally, no wheezing or crackles auscultated Abdomen-soft, nontender, no organomegaly Extremities-no edema in the lower extremities Neuro-alert, oriented x3, no focal deficit noted  Condition at discharge: good  The results of significant diagnostics from this hospitalization (including imaging, microbiology, ancillary and laboratory) are listed below for reference.   Imaging Studies: DG Chest 2 View Result Date: 01/24/2024 CLINICAL DATA:  Pneumonia, shortness of breath and weakness. EXAM: CHEST - 2 VIEW COMPARISON:  01/20/2024  FINDINGS: Stable appearance of left-sided Port-A-Cath, right-sided pacemaker and partially visualized left shoulder arthroplasty. The heart size is within normal limits. Stable moderate to large hiatal hernia. Stable elevation of the anterior right hemidiaphragm. There is no evidence of pulmonary edema, consolidation, pneumothorax or visible nodule. Potential trace bilateral pleural effusions. The visualized skeletal structures are unremarkable. IMPRESSION: Potential trace bilateral pleural effusions. Stable moderate to large hiatal hernia. Stable elevation of the anterior right hemidiaphragm. Electronically Signed   By: Irish Lack M.D.   On: 01/24/2024 14:35   DG Chest Portable 1 View Result Date: 01/20/2024 CLINICAL DATA:  Short of breath since yesterday, fever, history of breast cancer EXAM: PORTABLE CHEST 1 VIEW COMPARISON:  11/28/2023, 01/22/2022 FINDINGS: Single frontal view of the chest demonstrates dual lead pacer overlying right chest, proximal lead over right atrium and distal lead over right ventricle. Left chest wall port via internal jugular approach, tip overlies the superior vena cava. Cardiac silhouette is stable. Hiatal hernia is again noted. Patchy airspace disease at the lung bases, most pronounced in the right lower lobe. No effusion or pneumothorax. Postsurgical changes from left mastectomy. There are no acute or destructive bony abnormalities. Unremarkable left shoulder arthroplasty. IMPRESSION: 1. Patchy bibasilar airspace disease, most pronounced in the right lower lobe, consistent with pneumonia. 2. Hiatal hernia. Electronically Signed   By: Sharlet Salina M.D.   On: 01/20/2024 15:47    Microbiology: Results for orders placed or performed during the hospital encounter of 01/20/24  Resp panel by RT-PCR (RSV, Flu A&B, Covid) Anterior Nasal Swab     Status: None   Collection Time: 01/20/24  1:02 PM   Specimen: Anterior Nasal Swab  Result Value Ref Range Status   SARS  Coronavirus 2 by RT PCR NEGATIVE NEGATIVE Final    Comment: (NOTE) SARS-CoV-2 target nucleic acids are NOT DETECTED.  The SARS-CoV-2 RNA is generally detectable in upper respiratory specimens during the acute phase of infection. The lowest concentration of SARS-CoV-2 viral copies this assay can detect is 138 copies/mL. A negative result does not preclude SARS-Cov-2 infection and should not be used as the sole basis for treatment or other patient management decisions. A negative result may occur with  improper specimen collection/handling, submission of specimen other than nasopharyngeal swab, presence of viral mutation(s) within the areas  targeted by this assay, and inadequate number of viral copies(<138 copies/mL). A negative result must be combined with clinical observations, patient history, and epidemiological information. The expected result is Negative.  Fact Sheet for Patients:  BloggerCourse.com  Fact Sheet for Healthcare Providers:  SeriousBroker.it  This test is no t yet approved or cleared by the [place] FDA and  has been authorized for detection and/or diagnosis of SARS-CoV-2 by FDA under an Emergency Use Authorization (EUA). This EUA will remain  in effect (meaning this test can be used) for the duration of the COVID-19 declaration under Section 564(b)(1) of the Act, 21 U.S.C.section 360bbb-3(b)(1), unless the authorization is terminated  or revoked sooner.       Influenza A by PCR NEGATIVE NEGATIVE Final   Influenza B by PCR NEGATIVE NEGATIVE Final    Comment: (NOTE) The Xpert Xpress SARS-CoV-2/FLU/RSV plus assay is intended as an aid in the diagnosis of influenza from Nasopharyngeal swab specimens and should not be used as a sole basis for treatment. Nasal washings and aspirates are unacceptable for Xpert Xpress SARS-CoV-2/FLU/RSV testing.  Fact Sheet for  Patients: BloggerCourse.com  Fact Sheet for Healthcare Providers: SeriousBroker.it  This test is not yet approved or cleared by the [place] FDA and has been authorized for detection and/or diagnosis of SARS-CoV-2 by FDA under an Emergency Use Authorization (EUA). This EUA will remain in effect (meaning this test can be used) for the duration of the COVID-19 declaration under Section 564(b)(1) of the Act, 21 U.S.C. section 360bbb-3(b)(1), unless the authorization is terminated or revoked.     Resp Syncytial Virus by PCR NEGATIVE NEGATIVE Final    Comment: (NOTE) Fact Sheet for Patients: BloggerCourse.com  Fact Sheet for Healthcare Providers: SeriousBroker.it  This test is not yet approved or cleared by the [place] FDA and has been authorized for detection and/or diagnosis of SARS-CoV-2 by FDA under an Emergency Use Authorization (EUA). This EUA will remain in effect (meaning this test can be used) for the duration of the COVID-19 declaration under Section 564(b)(1) of the Act, 21 U.S.C. section 360bbb-3(b)(1), unless the authorization is terminated or revoked.  Performed at [facility], [address] [place], [place] [address]   Culture, blood (Routine X 2) w Reflex to ID Panel     Status: None   Collection Time: 01/21/24 12:52 AM   Specimen: BLOOD RIGHT HAND  Result Value Ref Range Status   Specimen Description   Final    BLOOD RIGHT HAND Performed at [facility], [address], [place], [place] [address]    Special Requests   Final    BOTTLES DRAWN AEROBIC AND ANAEROBIC Blood Culture results may not be optimal due to an inadequate volume of blood received in culture bottles Performed at [facility], [address] [place], [place] [address]    Culture   Final    NO GROWTH 5 DAYS Performed at [facility],  [address], [place], [place] [address]    Report Status 01/26/2024 FINAL  Final  Culture, blood (Routine X 2) w Reflex to ID Panel     Status: None   Collection Time: 01/21/24 12:58 AM   Specimen: BLOOD RIGHT ARM  Result Value Ref Range Status   Specimen Description   Final    BLOOD RIGHT ARM Performed at [facility], [address], [place], [place] [address]    Special Requests   Final  BOTTLES DRAWN AEROBIC AND ANAEROBIC Blood Culture results may not be optimal due to an inadequate volume of blood received in culture bottles Performed at [facility], [address] [place], [place] [address]    Culture   Final    NO GROWTH 5 DAYS Performed at [facility], [address], [place], [place] [address]    Report Status 01/26/2024 FINAL  Final  Expectorated Sputum Assessment w Gram Stain, Rflx to Resp Cult     Status: None   Collection Time: 01/21/24  9:31 PM   Specimen: Expectorated Sputum  Result Value Ref Range Status   Specimen Description EXPECTORATED SPUTUM  Final   Special Requests NONE  Final   Sputum evaluation   Final    THIS SPECIMEN IS ACCEPTABLE FOR SPUTUM CULTURE Performed at [facility], [address], [place], [place] [address]    Report Status 01/21/2024 FINAL  Final  Culture, Respiratory w Gram Stain     Status: None   Collection Time: 01/21/24  9:31 PM  Result Value Ref Range Status   Specimen Description   Final    EXPECTORATED SPUTUM Performed at [facility], [address], [place], [place] [address]    Special Requests   Final    NONE Reflexed from [id number] Performed at [facility], [address], [place], [place] [address]    Gram Stain   Final    FEW SQUAMOUS EPITHELIAL CELLS PRESENT RARE WBC PRESENT, PREDOMINANTLY MONONUCLEAR FEW GRAM POSITIVE COCCI IN CLUSTERS FEW GRAM NEGATIVE RODS    Culture   Final    Normal respiratory flora-no Staph aureus or  Pseudomonas seen Performed at [facility], [address] [place], [place] [address]    Report Status 01/23/2024 FINAL  Final  Gastrointestinal Panel by PCR , Stool     Status: None   Collection Time: 01/24/24  9:06 AM   Specimen: Stool  Result Value Ref Range Status   Campylobacter species NOT DETECTED NOT DETECTED Final   Plesimonas shigelloides NOT DETECTED NOT DETECTED Final   Salmonella species NOT DETECTED NOT DETECTED Final   Yersinia enterocolitica NOT DETECTED NOT DETECTED Final   Vibrio species NOT DETECTED NOT DETECTED Final   Vibrio cholerae NOT DETECTED NOT DETECTED Final   Enteroaggregative E coli (EAEC) NOT DETECTED NOT DETECTED Final   Enteropathogenic E coli (EPEC) NOT DETECTED NOT DETECTED Final   Enterotoxigenic E coli (ETEC) NOT DETECTED NOT DETECTED Final   Shiga like toxin producing E coli (STEC) NOT DETECTED NOT DETECTED Final   Shigella/Enteroinvasive E coli (EIEC) NOT DETECTED NOT DETECTED Final   Cryptosporidium NOT DETECTED NOT DETECTED Final   Cyclospora cayetanensis NOT DETECTED NOT DETECTED Final   Entamoeba histolytica NOT DETECTED NOT DETECTED Final   Giardia lamblia NOT DETECTED NOT DETECTED Final   Adenovirus F40/41 NOT DETECTED NOT DETECTED Final   Astrovirus NOT DETECTED NOT DETECTED Final   Norovirus GI/GII NOT DETECTED NOT DETECTED Final   Rotavirus A NOT DETECTED NOT DETECTED Final   Sapovirus (I, II, IV, and V) NOT DETECTED NOT DETECTED Final    Comment: Performed at [facility], [address] [place], [place] [address]    Labs: CBC: Recent Labs  Lab 01/22/24 0600 01/23/24 0819 01/24/24 0306 01/25/24 0320 01/26/24 0345  WBC 9.4 7.9 6.6 6.0 5.9  NEUTROABS 6.9 6.2 4.9 4.3 4.2  HGB 8.7* 9.6* 8.6* 9.3* 9.4*  HCT 26.9* 30.2* 26.8*  28.3* 29.0*  MCV 97.8 100.0 100.8* 98.3 98.0  PLT 103* 103* 109* 122* 156   Basic Metabolic Panel: Recent Labs  Lab 01/20/24 1410 01/21/24 0114 01/21/24 1810 01/22/24 0600  01/22/24 0608 01/23/24 0819 01/24/24 0306 01/25/24 0320 01/26/24 0345  NA  --    < >  --  137  --  134* 139 139 137  K  --    < >  --  4.1  --  3.3* 3.6 4.0 3.8  CL  --    < >  --  111  --  108 116* 111 110  CO2  --    < >  --  18*  --  16* 16* 19* 17*  GLUCOSE  --    < >  --  113*  --  118* 74 86 83  BUN  --    < >  --  31*  --  25* 25* 28* 29*  CREATININE  --    < > 1.66* 1.54*  --  1.36* 1.43* 1.18* 1.48*  CALCIUM  --    < >  --  6.8*  --  6.7* 6.5* 6.9* 6.9*  MG 1.6*  --  1.6*  --  1.9  --   --   --   --    < > = values in this interval not displayed.   Liver Function Tests: Recent Labs  Lab 01/22/24 0600 01/23/24 0819 01/24/24 0306 01/25/24 0320 01/26/24 0345  AST 20 25 26 24 26   ALT 18 20 21 22 23   ALKPHOS 61 76 78 76 77  BILITOT 0.6 0.5 0.4 0.5 0.4  PROT 4.8* 5.2* 4.8* 4.8* 5.1*  ALBUMIN 2.4* 2.4* 2.2* 2.3* 2.4*   CBG: Recent Labs  Lab 01/25/24 0807 01/25/24 1142 01/25/24 1652 01/25/24 2026 01/26/24 0744  GLUCAP 88 109* 87 109* 89    Discharge time spent: greater than 30 minutes.  Signed: [name], MD [facility] 01/26/2024

## 2024-01-26 NOTE — Plan of Care (Signed)

## 2024-01-28 ENCOUNTER — Other Ambulatory Visit: Payer: Self-pay

## 2024-01-30 ENCOUNTER — Ambulatory Visit

## 2024-01-30 ENCOUNTER — Ambulatory Visit: Admitting: Hematology & Oncology

## 2024-01-30 ENCOUNTER — Other Ambulatory Visit

## 2024-01-30 ENCOUNTER — Inpatient Hospital Stay: Payer: Medicare Other

## 2024-02-02 ENCOUNTER — Ambulatory Visit

## 2024-02-02 DIAGNOSIS — C50011 Malignant neoplasm of nipple and areola, right female breast: Secondary | ICD-10-CM

## 2024-02-06 ENCOUNTER — Encounter: Payer: Self-pay | Admitting: Hematology & Oncology

## 2024-02-06 ENCOUNTER — Encounter: Payer: Self-pay | Admitting: *Deleted

## 2024-02-06 ENCOUNTER — Inpatient Hospital Stay: Payer: Medicare Other | Admitting: Hematology & Oncology

## 2024-02-06 ENCOUNTER — Inpatient Hospital Stay: Payer: Medicare Other

## 2024-02-06 VITALS — BP 134/66 | HR 97 | Temp 98.3°F | Resp 18 | Ht 64.0 in | Wt 214.8 lb

## 2024-02-06 VITALS — BP 146/81 | HR 79 | Temp 97.9°F | Resp 20

## 2024-02-06 DIAGNOSIS — Z5111 Encounter for antineoplastic chemotherapy: Secondary | ICD-10-CM | POA: Diagnosis present

## 2024-02-06 DIAGNOSIS — Z1721 Progesterone receptor positive status: Secondary | ICD-10-CM | POA: Diagnosis not present

## 2024-02-06 DIAGNOSIS — Z170 Estrogen receptor positive status [ER+]: Secondary | ICD-10-CM | POA: Diagnosis not present

## 2024-02-06 DIAGNOSIS — C50011 Malignant neoplasm of nipple and areola, right female breast: Secondary | ICD-10-CM

## 2024-02-06 DIAGNOSIS — C50911 Malignant neoplasm of unspecified site of right female breast: Secondary | ICD-10-CM | POA: Diagnosis present

## 2024-02-06 DIAGNOSIS — Z79899 Other long term (current) drug therapy: Secondary | ICD-10-CM | POA: Diagnosis not present

## 2024-02-06 DIAGNOSIS — Z1732 Human epidermal growth factor receptor 2 negative status: Secondary | ICD-10-CM | POA: Diagnosis not present

## 2024-02-06 DIAGNOSIS — Z5189 Encounter for other specified aftercare: Secondary | ICD-10-CM | POA: Diagnosis not present

## 2024-02-06 DIAGNOSIS — Z9011 Acquired absence of right breast and nipple: Secondary | ICD-10-CM | POA: Diagnosis not present

## 2024-02-06 LAB — CMP (CANCER CENTER ONLY)
ALT: 16 U/L (ref 0–44)
AST: 13 U/L — ABNORMAL LOW (ref 15–41)
Albumin: 4 g/dL (ref 3.5–5.0)
Alkaline Phosphatase: 68 U/L (ref 38–126)
Anion gap: 9 (ref 5–15)
BUN: 38 mg/dL — ABNORMAL HIGH (ref 8–23)
CO2: 26 mmol/L (ref 22–32)
Calcium: 11.5 mg/dL — ABNORMAL HIGH (ref 8.9–10.3)
Chloride: 101 mmol/L (ref 98–111)
Creatinine: 1.53 mg/dL — ABNORMAL HIGH (ref 0.44–1.00)
GFR, Estimated: 35 mL/min — ABNORMAL LOW (ref >60)
Glucose, Bld: 272 mg/dL — ABNORMAL HIGH (ref 70–99)
Potassium: 4.2 mmol/L (ref 3.5–5.1)
Sodium: 136 mmol/L (ref 135–145)
Total Bilirubin: 0.6 mg/dL (ref 0.0–1.2)
Total Protein: 6.4 g/dL — ABNORMAL LOW (ref 6.5–8.1)

## 2024-02-06 LAB — CBC WITH DIFFERENTIAL (CANCER CENTER ONLY)
Abs Immature Granulocytes: 0.05 10*3/uL (ref 0.00–0.07)
Basophils Absolute: 0 10*3/uL (ref 0.0–0.1)
Basophils Relative: 0 %
Eosinophils Absolute: 0 10*3/uL (ref 0.0–0.5)
Eosinophils Relative: 0 %
HCT: 35.5 % — ABNORMAL LOW (ref 36.0–46.0)
Hemoglobin: 11.9 g/dL — ABNORMAL LOW (ref 12.0–15.0)
Immature Granulocytes: 1 %
Lymphocytes Relative: 5 %
Lymphs Abs: 0.5 10*3/uL — ABNORMAL LOW (ref 0.7–4.0)
MCH: 32.2 pg (ref 26.0–34.0)
MCHC: 33.5 g/dL (ref 30.0–36.0)
MCV: 96.2 fL (ref 80.0–100.0)
Monocytes Absolute: 0.3 10*3/uL (ref 0.1–1.0)
Monocytes Relative: 2 %
Neutro Abs: 9.7 10*3/uL — ABNORMAL HIGH (ref 1.7–7.7)
Neutrophils Relative %: 92 %
Platelet Count: 287 10*3/uL (ref 150–400)
RBC: 3.69 MIL/uL — ABNORMAL LOW (ref 3.87–5.11)
RDW: 16.1 % — ABNORMAL HIGH (ref 11.5–15.5)
WBC Count: 10.5 10*3/uL (ref 4.0–10.5)
nRBC: 0 % (ref 0.0–0.2)

## 2024-02-06 MED ORDER — SODIUM CHLORIDE 0.9 % IV SOLN
600.0000 mg/m2 | Freq: Once | INTRAVENOUS | Status: AC
  Administered 2024-02-06: 1300 mg via INTRAVENOUS
  Filled 2024-02-06: qty 65

## 2024-02-06 MED ORDER — SODIUM CHLORIDE 0.9 % IV SOLN
140.0000 mg | Freq: Once | INTRAVENOUS | Status: AC
  Administered 2024-02-06: 140 mg via INTRAVENOUS
  Filled 2024-02-06: qty 14

## 2024-02-06 MED ORDER — SODIUM CHLORIDE 0.9% FLUSH
10.0000 mL | INTRAVENOUS | Status: DC | PRN
  Administered 2024-02-06: 10 mL via INTRAVENOUS

## 2024-02-06 MED ORDER — DEXAMETHASONE SODIUM PHOSPHATE 10 MG/ML IJ SOLN
10.0000 mg | Freq: Once | INTRAMUSCULAR | Status: AC
  Administered 2024-02-06: 10 mg via INTRAVENOUS
  Filled 2024-02-06: qty 1

## 2024-02-06 MED ORDER — SODIUM CHLORIDE 0.9% FLUSH
10.0000 mL | INTRAVENOUS | Status: DC | PRN
  Administered 2024-02-06: 10 mL

## 2024-02-06 MED ORDER — HEPARIN SOD (PORK) LOCK FLUSH 100 UNIT/ML IV SOLN: 500.0000 [IU] | Freq: Once | INTRAVENOUS | Status: DC

## 2024-02-06 MED ORDER — HEPARIN SOD (PORK) LOCK FLUSH 100 UNIT/ML IV SOLN
500.0000 [IU] | Freq: Once | INTRAVENOUS | Status: AC | PRN
  Administered 2024-02-06: 500 [IU]

## 2024-02-06 MED ORDER — SODIUM CHLORIDE 0.9 % IV SOLN: INTRAVENOUS | Status: DC

## 2024-02-06 MED ORDER — PALONOSETRON HCL INJECTION 0.25 MG/5ML
0.2500 mg | Freq: Once | INTRAVENOUS | Status: AC
  Administered 2024-02-06: 0.25 mg via INTRAVENOUS
  Filled 2024-02-06: qty 5

## 2024-02-06 NOTE — Patient Instructions (Signed)
 [facility] - A DEPT OF [facility]  Discharge Instructions: Thank you for choosing [facility] to provide your oncology and hematology care.   If you have a lab appointment with the Cancer Center, please go directly to the Cancer Center and check in at the registration area.  Wear comfortable clothing and clothing appropriate for easy access to any Portacath or PICC line.   We strive to give you quality time with your provider. You may need to reschedule your appointment if you arrive late (15 or more minutes).  Arriving late affects you and other patients whose appointments are after yours.  Also, if you miss three or more appointments without notifying the office, you may be dismissed from the clinic at the provider's discretion.      For prescription refill requests, have your pharmacy contact our office and allow 72 hours for refills to be completed.    Today you received the following chemotherapy and/or immunotherapy agents Cytoxan and Taxotere.   To help prevent nausea and vomiting after your treatment, we encourage you to take your nausea medication as directed.  BELOW ARE SYMPTOMS THAT SHOULD BE REPORTED IMMEDIATELY: *FEVER GREATER THAN 100.4 F (38 C) OR HIGHER *CHILLS OR SWEATING *NAUSEA AND VOMITING THAT IS NOT CONTROLLED WITH YOUR NAUSEA MEDICATION *UNUSUAL SHORTNESS OF BREATH *UNUSUAL BRUISING OR BLEEDING *URINARY PROBLEMS (pain or burning when urinating, or frequent urination) *BOWEL PROBLEMS (unusual diarrhea, constipation, pain near the anus) TENDERNESS IN MOUTH AND THROAT WITH OR WITHOUT PRESENCE OF ULCERS (sore throat, sores in mouth, or a toothache) UNUSUAL RASH, SWELLING OR PAIN  UNUSUAL VAGINAL DISCHARGE OR ITCHING   Items with * indicate a potential emergency and should be followed up as soon as possible or go to the Emergency Department if any problems should occur.  Please show the CHEMOTHERAPY ALERT CARD or  IMMUNOTHERAPY ALERT CARD at check-in to the Emergency Department and triage nurse. Should you have questions after your visit or need to cancel or reschedule your appointment, please contact [facility] - A DEPT OF [facility]  [phone] and follow the prompts.  Office hours are 8:00 a.m. to 4:30 p.m. Monday - Friday. Please note that voicemails left after 4:00 p.m. may not be returned until the following business day.  We are closed weekends and major holidays. You have access to a nurse at all times for urgent questions. Please call the main number to the clinic [phone] and follow the prompts.  For any non-urgent questions, you may also contact your provider using MyChart. We now offer e-Visits for anyone 69 and older to request care online for non-urgent symptoms. For details visit [email or web address].   Also download the MyChart app! Go to the app store, search "MyChart", open the app, select [facility], and log in with your MyChart username and password.

## 2024-02-06 NOTE — Progress Notes (Signed)
 Per Dr. Marin Olp, it's OK to treat with today's labs. Pharmacy notified.

## 2024-02-06 NOTE — Progress Notes (Signed)
 Patient will proceed with cycle three. She was admitted after cycle two for pneumonia She is planned for four cycles at this time.   Oncology Nurse Navigator Documentation     02/06/2024    9:45 AM  Oncology Nurse Navigator Flowsheets  Navigator Follow Up Date: 02/27/2024  Navigator Follow Up Reason: Follow-up Appointment;Chemotherapy  Navigator Radiographer, therapeutic Encounter Type Treatment  Patient Visit Type MedOnc  Treatment Phase Active Tx  Barriers/Navigation Needs Coordination of Care;Education  Interventions Psycho-Social Support  Acuity Level 2-Minimal Needs (1-2 Barriers Identified)  Support Groups/Services Friends and Family  Time Spent with Patient 15

## 2024-02-06 NOTE — Progress Notes (Signed)
 Patient with weight loss, doses adjusted to match current weight per Dr. Gustavo Lah instructions.

## 2024-02-06 NOTE — Patient Instructions (Signed)

## 2024-02-06 NOTE — Progress Notes (Signed)
 Hematology and Oncology Follow Up Visit  Kristin [name] [id number] [birth year] 78 y.o. 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  I last saw her, she was in the hospital.  She had pneumonia.  She was and IV antibiotics.  She was not neutropenic.  She is feeling better.  We will go ahead with her.  Third cycle of chemotherapy.  She has had no fever.  She has had no nausea or vomiting.  She has had no bleeding.  There is been no rashes.  He has had no leg swelling.  Overall, I would have to say that her performance status is probably ECOG 1.   Medications:  Current Outpatient Medications:    ALPRAZolam (XANAX) 0.25 MG tablet, Take 0.25 mg by mouth as needed for anxiety., Disp: , Rfl:    aspirin EC 81 MG tablet, Take 81 mg by mouth every other day. In the morning Swallow whole., Disp: , Rfl:    atorvastatin (LIPITOR) 40 MG tablet, Take 40 mg by mouth at bedtime., Disp: , Rfl:    calcium carbonate (CALCIUM 600) 600 MG TABS tablet, Take 600 mg by mouth 2 (two) times daily with a meal., Disp: , Rfl:    colestipol (COLESTID) 1 g tablet, Take 1 g by mouth daily as needed (diarrhea)., Disp: , Rfl:    cyanocobalamin (VITAMIN B12) 1000 MCG tablet, Take 1,000 mcg by mouth in the morning., Disp: , Rfl:    dexamethasone (DECADRON) 4 MG tablet, Take 2 tabs by mouth 2 times daily starting day before chemo. Then take 2 tabs daily for 2 days starting day after chemo. Take with food. (Patient taking differently: Take 4-8 mg by mouth See admin instructions. Take 8 mg by mouth 2 times daily starting day before chemo. Then take 4 mg daily for 2 days starting day after chemo. Take with food.), Disp: 30 tablet, Rfl: 1    famotidine (PEPCID) 20 MG tablet, Take 20 mg by mouth 2 (two) times daily., Disp: , Rfl:    ferrous sulfate 325 (65 FE) MG tablet, Take 325 mg by mouth in the morning and at bedtime., Disp: , Rfl:    fluticasone (FLONASE) 50 MCG/ACT nasal spray, Place 1 spray into both nostrils daily as needed for allergies or rhinitis., Disp: , Rfl:    furosemide (LASIX) 20 MG tablet, Take 1 tablet (20 mg total) by mouth daily as needed for edema., Disp: 30 tablet, Rfl: 1   gabapentin (NEURONTIN) 300 MG capsule, Take 600 mg by mouth at bedtime., Disp: , Rfl:    hydrALAZINE (APRESOLINE) 25 MG tablet, Take 75 mg by mouth in the morning and at bedtime., Disp: , Rfl:    isosorbide mononitrate (IMDUR) 30 MG 24 hr tablet, Take 30 mg by mouth in the morning., Disp: , Rfl:    JARDIANCE 10 MG TABS tablet, Take 10 mg by mouth in the morning., Disp: , Rfl:    levofloxacin (LEVAQUIN) 250 MG tablet, Take 250 mg by mouth daily., Disp: , Rfl:    lidocaine-prilocaine (EMLA) cream, Apply a dime size of cream to port-a-cath 1-2 hours prior to access. Cover with [employer] Lacy., Disp: 30 g, Rfl: 3   lisinopril (  ZESTRIL) 20 MG tablet, Take 20 mg by mouth in the morning., Disp: , Rfl:    MAGNESIUM GLYCINATE PO, Take 400 mg by mouth in the morning and at bedtime., Disp: , Rfl:    Melatonin 10 MG TABS, Take 10 mg by mouth at bedtime., Disp: , Rfl:    metoprolol succinate (TOPROL-XL) 50 MG 24 hr tablet, Take 50 mg by mouth 2 (two) times daily., Disp: , Rfl:    Multiple Vitamin (MULTIVITAMIN WITH MINERALS) TABS tablet, Take 1 tablet by mouth at bedtime., Disp: , Rfl:    niacin (SLO-NIACIN) 500 MG tablet, Take 500 mg by mouth 3 (three) times a week., Disp: , Rfl:    Omega-3 Fatty Acids (FISH OIL) 1000 MG CAPS, Take 1,000 mg by mouth at bedtime., Disp: , Rfl:    Polyethyl Glycol-Propyl Glycol (SYSTANE) 0.4-0.3 % SOLN, Place 1 drop into both eyes daily as needed (dry/irritated eyes.)., Disp: , Rfl:    sodium bicarbonate 650 MG tablet, Take 1  tablet (650 mg total) by mouth 2 (two) times daily for 60 doses., Disp: 60 tablet, Rfl: 0   SYNTHROID 75 MCG tablet, Take 75 mcg by mouth daily before breakfast., Disp: , Rfl:    TRULICITY 4.5 MG/0.5ML SOPN, Inject 4.5 mg into the skin once a week. Sunday, Disp: , Rfl:    Vitamin D-Vitamin K (VITAMIN K2-VITAMIN D3 PO), Take 1 tablet by mouth 2 (two) times a week. Tuesdays and Fridays, Disp: , Rfl:    zolpidem (AMBIEN CR) 12.5 MG CR tablet, Take 12.5 mg by mouth at bedtime as needed for sleep., Disp: , Rfl:    guaiFENesin (ROBITUSSIN) 100 MG/5ML liquid, Take 5 mLs by mouth every 4 (four) hours as needed for cough or to loosen phlegm. (Patient not taking: Reported on 02/06/2024), Disp: 120 mL, Rfl: 0   ondansetron (ZOFRAN) 8 MG tablet, Take 1 tablet (8 mg total) by mouth every 8 (eight) hours as needed for nausea or vomiting. Start on the third day after chemotherapy. (Patient not taking: Reported on 02/06/2024), Disp: 30 tablet, Rfl: 1   prochlorperazine (COMPAZINE) 10 MG tablet, Take 1 tablet (10 mg total) by mouth every 6 (six) hours as needed for nausea or vomiting. (Patient not taking: Reported on 02/06/2024), Disp: 30 tablet, Rfl: 1  Allergies:  Allergies  Allergen Reactions   Ibuprofen Other (See Comments)    Does not take due to kidney function    Levodopa Other (See Comments)    Increase of dopamine levels   Oxycodone Other (See Comments)    Pt cannot tolerate oxycodone- makes her "loopy"/ AMS    Past Medical History, Surgical history, Social history, and Family History were reviewed and updated.  Review of Systems: Review of Systems  Constitutional: Negative.   HENT:  Negative.    Eyes: Negative.   Respiratory: Negative.    Cardiovascular: Negative.   Gastrointestinal: Negative.   Endocrine: Negative.   Genitourinary: Negative.    Musculoskeletal: Negative.   Skin: Negative.   Neurological: Negative.   Hematological: Negative.   Psychiatric/Behavioral: Negative.       Physical Exam: Vital signs are temperature of 98.3.  Pulse 97.  Blood pressure 134/66.  Weight is 214 pounds.    Wt Readings from Last 3 Encounters:  02/06/24 214 lb 12.8 oz (97.4 kg)  01/21/24 227 lb 1.2 oz (103 kg)  01/09/24 229 lb (103.9 kg)    Physical Exam Vitals reviewed.  Constitutional:      Comments: Her breast exam  shows the right mastectomy.  This is healing.  She has no erythema or warmth.  There is no skin breakdown.  There is no right axillary adenopathy.  Left breast shows lumpectomy scar at the 12 o'clock position.  There is no mass in the left breast.  There is no double discharge.  There is no left axillary adenopathy.  HENT:     Head: Normocephalic and atraumatic.  Eyes:     Pupils: Pupils are equal, round, and reactive to light.  Cardiovascular:     Rate and Rhythm: Normal rate and regular rhythm.     Heart sounds: Normal heart sounds.  Pulmonary:     Effort: Pulmonary effort is normal.     Breath sounds: Normal breath sounds.  Abdominal:     General: Bowel sounds are normal.     Palpations: Abdomen is soft.  Musculoskeletal:        General: No tenderness or deformity. Normal range of motion.     Cervical back: Normal range of motion.  Lymphadenopathy:     Cervical: No cervical adenopathy.  Skin:    General: Skin is warm and dry.     Findings: No erythema or rash.  Neurological:     Mental Status: She is alert and oriented to person, place, and time.  Psychiatric:        Behavior: Behavior normal.        Thought Content: Thought content normal.        Judgment: Judgment normal.      Lab Results  Component Value Date   WBC 10.5 02/06/2024   HGB 11.9 (L) 02/06/2024   HCT 35.5 (L) 02/06/2024   MCV 96.2 02/06/2024   PLT 287 02/06/2024     Chemistry      Component Value Date/Time   NA 136 02/06/2024 0855   K 4.2 02/06/2024 0855   CL 101 02/06/2024 0855   CO2 26 02/06/2024 0855   BUN 38 (H) 02/06/2024 0855   CREATININE 1.53 (H)  02/06/2024 0855      Component Value Date/Time   CALCIUM 11.5 (H) 02/06/2024 0855   ALKPHOS 68 02/06/2024 0855   AST 13 (L) 02/06/2024 0855   ALT 16 02/06/2024 0855   BILITOT 0.6 02/06/2024 0855      Impression and Plan: Ms. Parkin is a very nice 78 year old postmenopausal white female.  She has a second breast cancer.  The first breast cancer was about 32 years ago.  She underwent chemotherapy and radiation after her lumpectomy.  She now has a second breast cancer.  She had a mastectomy.  She has a high Oncotype score.  As such, we are giving her adjuvant chemotherapy.  We will go ahead with a 3rd cycle of Taxotere/Cytoxan.  She will get a total of 4 cycles..  She will also get her G-CSF.  She has already started the Levaquin.  I think this is okay.  We will plan to get her back in 3 more weeks for her 4th and final cycle of adjuvant therapy.    As always, she is showing a lot of strength.  I am not surprised by this.    Josph Macho, MD 2/28/20259:57 AM

## 2024-02-09 ENCOUNTER — Inpatient Hospital Stay: Payer: Medicare Other | Attending: Hematology & Oncology

## 2024-02-09 VITALS — BP 150/78 | HR 80 | Temp 97.7°F | Resp 20

## 2024-02-09 DIAGNOSIS — C50911 Malignant neoplasm of unspecified site of right female breast: Secondary | ICD-10-CM | POA: Diagnosis present

## 2024-02-09 DIAGNOSIS — Z1732 Human epidermal growth factor receptor 2 negative status: Secondary | ICD-10-CM | POA: Insufficient documentation

## 2024-02-09 DIAGNOSIS — Z170 Estrogen receptor positive status [ER+]: Secondary | ICD-10-CM | POA: Diagnosis not present

## 2024-02-09 DIAGNOSIS — Z5111 Encounter for antineoplastic chemotherapy: Secondary | ICD-10-CM | POA: Insufficient documentation

## 2024-02-09 DIAGNOSIS — C50011 Malignant neoplasm of nipple and areola, right female breast: Secondary | ICD-10-CM

## 2024-02-09 DIAGNOSIS — Z5189 Encounter for other specified aftercare: Secondary | ICD-10-CM | POA: Insufficient documentation

## 2024-02-09 DIAGNOSIS — Z1721 Progesterone receptor positive status: Secondary | ICD-10-CM | POA: Insufficient documentation

## 2024-02-09 MED ORDER — PEGFILGRASTIM-FPGK 6 MG/0.6ML ~~LOC~~ SOSY
6.0000 mg | PREFILLED_SYRINGE | Freq: Once | SUBCUTANEOUS | Status: AC
  Administered 2024-02-09: 6 mg via SUBCUTANEOUS
  Filled 2024-02-09: qty 0.6

## 2024-02-09 NOTE — Patient Instructions (Signed)

## 2024-02-10 ENCOUNTER — Encounter: Payer: Self-pay | Admitting: Hematology & Oncology

## 2024-02-10 ENCOUNTER — Other Ambulatory Visit: Payer: Self-pay

## 2024-02-20 ENCOUNTER — Encounter: Payer: Self-pay | Admitting: Hematology & Oncology

## 2024-02-27 ENCOUNTER — Inpatient Hospital Stay

## 2024-02-27 ENCOUNTER — Inpatient Hospital Stay: Admitting: Family

## 2024-02-27 ENCOUNTER — Encounter: Payer: Self-pay | Admitting: Family

## 2024-02-27 ENCOUNTER — Encounter: Payer: Self-pay | Admitting: Hematology & Oncology

## 2024-02-27 ENCOUNTER — Encounter: Payer: Self-pay | Admitting: *Deleted

## 2024-02-27 VITALS — BP 138/79 | HR 85 | Resp 17

## 2024-02-27 VITALS — BP 136/66 | HR 96 | Temp 97.9°F | Resp 18 | Wt 225.0 lb

## 2024-02-27 DIAGNOSIS — C50011 Malignant neoplasm of nipple and areola, right female breast: Secondary | ICD-10-CM

## 2024-02-27 DIAGNOSIS — D649 Anemia, unspecified: Secondary | ICD-10-CM | POA: Diagnosis not present

## 2024-02-27 DIAGNOSIS — Z5111 Encounter for antineoplastic chemotherapy: Secondary | ICD-10-CM | POA: Diagnosis not present

## 2024-02-27 LAB — CMP (CANCER CENTER ONLY)
ALT: 19 U/L (ref 0–44)
AST: 16 U/L (ref 15–41)
Albumin: 4.1 g/dL (ref 3.5–5.0)
Alkaline Phosphatase: 86 U/L (ref 38–126)
Anion gap: 11 (ref 5–15)
BUN: 48 mg/dL — ABNORMAL HIGH (ref 8–23)
CO2: 19 mmol/L — ABNORMAL LOW (ref 22–32)
Calcium: 8.9 mg/dL (ref 8.9–10.3)
Chloride: 107 mmol/L (ref 98–111)
Creatinine: 1.5 mg/dL — ABNORMAL HIGH (ref 0.44–1.00)
GFR, Estimated: 35 mL/min — ABNORMAL LOW (ref >60)
Glucose, Bld: 236 mg/dL — ABNORMAL HIGH (ref 70–99)
Potassium: 4.7 mmol/L (ref 3.5–5.1)
Sodium: 137 mmol/L (ref 135–145)
Total Bilirubin: 0.5 mg/dL (ref 0.0–1.2)
Total Protein: 6.3 g/dL — ABNORMAL LOW (ref 6.5–8.1)

## 2024-02-27 LAB — CBC WITH DIFFERENTIAL (CANCER CENTER ONLY)
Abs Immature Granulocytes: 0.05 10*3/uL (ref 0.00–0.07)
Basophils Absolute: 0 10*3/uL (ref 0.0–0.1)
Basophils Relative: 0 %
Eosinophils Absolute: 0 10*3/uL (ref 0.0–0.5)
Eosinophils Relative: 0 %
HCT: 32 % — ABNORMAL LOW (ref 36.0–46.0)
Hemoglobin: 10.6 g/dL — ABNORMAL LOW (ref 12.0–15.0)
Immature Granulocytes: 1 %
Lymphocytes Relative: 5 %
Lymphs Abs: 0.4 10*3/uL — ABNORMAL LOW (ref 0.7–4.0)
MCH: 32.4 pg (ref 26.0–34.0)
MCHC: 33.1 g/dL (ref 30.0–36.0)
MCV: 97.9 fL (ref 80.0–100.0)
Monocytes Absolute: 0.5 10*3/uL (ref 0.1–1.0)
Monocytes Relative: 6 %
Neutro Abs: 6.7 10*3/uL (ref 1.7–7.7)
Neutrophils Relative %: 88 %
Platelet Count: 228 10*3/uL (ref 150–400)
RBC: 3.27 MIL/uL — ABNORMAL LOW (ref 3.87–5.11)
RDW: 16.6 % — ABNORMAL HIGH (ref 11.5–15.5)
WBC Count: 7.5 10*3/uL (ref 4.0–10.5)
nRBC: 0 % (ref 0.0–0.2)

## 2024-02-27 LAB — LACTATE DEHYDROGENASE: LDH: 192 U/L (ref 98–192)

## 2024-02-27 MED ORDER — HEPARIN SOD (PORK) LOCK FLUSH 100 UNIT/ML IV SOLN
500.0000 [IU] | Freq: Once | INTRAVENOUS | Status: AC | PRN
  Administered 2024-02-27: 500 [IU]

## 2024-02-27 MED ORDER — SODIUM CHLORIDE 0.9 % IV SOLN: INTRAVENOUS | Status: DC

## 2024-02-27 MED ORDER — SODIUM CHLORIDE 0.9% FLUSH
10.0000 mL | INTRAVENOUS | Status: DC | PRN
  Administered 2024-02-27: 10 mL

## 2024-02-27 MED ORDER — SODIUM CHLORIDE 0.9 % IV SOLN
140.0000 mg | Freq: Once | INTRAVENOUS | Status: AC
  Administered 2024-02-27: 140 mg via INTRAVENOUS
  Filled 2024-02-27: qty 14

## 2024-02-27 MED ORDER — SODIUM CHLORIDE 0.9 % IV SOLN
600.0000 mg/m2 | Freq: Once | INTRAVENOUS | Status: AC
  Administered 2024-02-27: 1300 mg via INTRAVENOUS
  Filled 2024-02-27: qty 65

## 2024-02-27 MED ORDER — DEXAMETHASONE SODIUM PHOSPHATE 10 MG/ML IJ SOLN
10.0000 mg | Freq: Once | INTRAMUSCULAR | Status: AC
  Administered 2024-02-27: 10 mg via INTRAVENOUS
  Filled 2024-02-27: qty 1

## 2024-02-27 MED ORDER — PALONOSETRON HCL INJECTION 0.25 MG/5ML
0.2500 mg | Freq: Once | INTRAVENOUS | Status: AC
  Administered 2024-02-27: 0.25 mg via INTRAVENOUS
  Filled 2024-02-27: qty 5

## 2024-02-27 NOTE — Progress Notes (Signed)
 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 She is managing the best she can. Today is her final cycle and she will proceed as planned.   Caregiver Handbook given to patient.   Oncology Nurse Navigator Documentation     02/27/2024   10:00 AM  Oncology Nurse Navigator Flowsheets  Phase of Treatment Chemotherapy  Chemotherapy Actual End Date: 02/27/2024  Navigator Follow Up Date: 04/23/2024  Navigator Follow Up Reason: Follow-up Appointment  Navigator Location [facility]  Navigator Encounter Type Treatment  Patient Visit Type MedOnc  Treatment Phase Final Chemo TX  Barriers/Navigation Needs Coordination of Care;Education  Education Other  Interventions Psycho-Social Support  Acuity Level 2-Minimal Needs (1-2 Barriers Identified)  Education Method Verbal  Support Groups/Services Friends and Family  Time Spent with Patient 15

## 2024-02-27 NOTE — Patient Instructions (Signed)
 [facility] - A DEPT OF [facility]  Discharge Instructions: Thank you for choosing [facility] to provide your oncology and hematology care.   If you have a lab appointment with the Cancer Center, please go directly to the Cancer Center and check in at the registration area.  Wear comfortable clothing and clothing appropriate for easy access to any Portacath or PICC line.   We strive to give you quality time with your provider. You may need to reschedule your appointment if you arrive late (15 or more minutes).  Arriving late affects you and other patients whose appointments are after yours.  Also, if you miss three or more appointments without notifying the office, you may be dismissed from the clinic at the provider's discretion.      For prescription refill requests, have your pharmacy contact our office and allow 72 hours for refills to be completed.    Today you received the following chemotherapy and/or immunotherapy agents Taxotere/Cytoxan      To help prevent nausea and vomiting after your treatment, we encourage you to take your nausea medication as directed.  BELOW ARE SYMPTOMS THAT SHOULD BE REPORTED IMMEDIATELY: *FEVER GREATER THAN 100.4 F (38 C) OR HIGHER *CHILLS OR SWEATING *NAUSEA AND VOMITING THAT IS NOT CONTROLLED WITH YOUR NAUSEA MEDICATION *UNUSUAL SHORTNESS OF BREATH *UNUSUAL BRUISING OR BLEEDING *URINARY PROBLEMS (pain or burning when urinating, or frequent urination) *BOWEL PROBLEMS (unusual diarrhea, constipation, pain near the anus) TENDERNESS IN MOUTH AND THROAT WITH OR WITHOUT PRESENCE OF ULCERS (sore throat, sores in mouth, or a toothache) UNUSUAL RASH, SWELLING OR PAIN  UNUSUAL VAGINAL DISCHARGE OR ITCHING   Items with * indicate a potential emergency and should be followed up as soon as possible or go to the Emergency Department if any problems should occur.  Please show the CHEMOTHERAPY ALERT CARD or  IMMUNOTHERAPY ALERT CARD at check-in to the Emergency Department and triage nurse. Should you have questions after your visit or need to cancel or reschedule your appointment, please contact [facility] - A DEPT OF [facility]  [phone] and follow the prompts.  Office hours are 8:00 a.m. to 4:30 p.m. Monday - Friday. Please note that voicemails left after 4:00 p.m. may not be returned until the following business day.  We are closed weekends and major holidays. You have access to a nurse at all times for urgent questions. Please call the main number to the clinic [phone] and follow the prompts.  For any non-urgent questions, you may also contact your provider using MyChart. We now offer e-Visits for anyone 40 and older to request care online for non-urgent symptoms. For details visit [email or web address].   Also download the MyChart app! Go to the app store, search "MyChart", open the app, select [place], and log in with your MyChart username and password.

## 2024-02-27 NOTE — Progress Notes (Signed)
 Hematology and Oncology Follow Up Visit  Nirvi Boehler [id number] 1946/09/28 78 y.o. 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 Creasman is here today for follow-up and treatment. She is feeling fatigued. She has been very busy caring for her husband who is in poor health right now.  She also states that she has never been a good sleep at night. She only sleeps at most 4 hours and does not nap during the day. No changes on on today's exam of the chest. Left mastectomy intact. No changes with right breast. No mass, lesion or rash.  No adenopathy or lymphedema noted.  She has occasional constipation with taking the steroid. She takes her max dose of magnesium on these days and that relieves her symptoms.  No fever, chills, n/v, cough, rash, dizziness, SOB, chest pain, palpitations, abdominal pain or changes in bowel or bladder habits at this time.  No swelling or tingling in her extremities.  No falls or syncope reported.  Appetite and hydration are good. Weight is stable at 225 lbs.   ECOG Performance Status: 1 - Symptomatic but completely ambulatory  Medications:  Allergies as of 02/27/2024       Reactions   Ibuprofen Other (See Comments)   Does not take due to kidney function    Levodopa Other (See Comments)   Increase of dopamine levels   Oxycodone Other (See Comments)   Pt cannot tolerate oxycodone- makes her "loopy"/ AMS        Medication List        Accurate as of February 27, 2024 10:10 AM. If you have any questions, ask your nurse or doctor.          ALPRAZolam 0.25 MG tablet Commonly known as: XANAX Take 0.25 mg by mouth as needed for anxiety.   aspirin EC 81 MG tablet Take 81 mg  by mouth every other day. In the morning Swallow whole.   atorvastatin 40 MG tablet Commonly known as: LIPITOR Take 40 mg by mouth at bedtime.   Calcium 600 600 MG Tabs tablet Generic drug: calcium carbonate Take 600 mg by mouth 2 (two) times daily with a meal.   colestipol 1 g tablet Commonly known as: COLESTID Take 1 g by mouth daily as needed (diarrhea).   cyanocobalamin 1000 MCG tablet Commonly known as: VITAMIN B12 Take 1,000 mcg by mouth in the morning.   dexamethasone 4 MG tablet Commonly known as: DECADRON Take 2 tabs by mouth 2 times daily starting day before chemo. Then take 2 tabs daily for 2 days starting day after chemo. Take with food. What changed:  how much to take how to take this when to take this additional instructions   famotidine 20 MG tablet Commonly known as: PEPCID Take 20 mg by mouth 2 (two) times daily.   ferrous sulfate 325 (65 FE) MG tablet Take 325 mg by mouth in the morning and at bedtime.   Fish Oil 1000 MG Caps Take 1,000 mg by mouth at bedtime.   fluticasone 50 MCG/ACT nasal spray Commonly known as: FLONASE Place 1 spray into both nostrils daily as needed for allergies or rhinitis.   furosemide 20 MG  tablet Commonly known as: Lasix Take 1 tablet (20 mg total) by mouth daily as needed for edema.   gabapentin 300 MG capsule Commonly known as: NEURONTIN Take 600 mg by mouth at bedtime.   guaiFENesin 100 MG/5ML liquid Commonly known as: ROBITUSSIN Take 5 mLs by mouth every 4 (four) hours as needed for cough or to loosen phlegm.   hydrALAZINE 25 MG tablet Commonly known as: APRESOLINE Take 75 mg by mouth in the morning and at bedtime.   isosorbide mononitrate 30 MG 24 hr tablet Commonly known as: IMDUR Take 30 mg by mouth in the morning.   Jardiance 10 MG Tabs tablet Generic drug: empagliflozin Take 10 mg by mouth in the morning.   levofloxacin 250 MG tablet Commonly known as: LEVAQUIN Take 250 mg by mouth daily.    lidocaine-prilocaine cream Commonly known as: EMLA Apply a dime size of cream to port-a-cath 1-2 hours prior to access. Cover with [employer].   lisinopril 20 MG tablet Commonly known as: ZESTRIL Take 20 mg by mouth in the morning.   MAGNESIUM GLYCINATE PO Take 400 mg by mouth in the morning and at bedtime.   Melatonin 10 MG Tabs Take 10 mg by mouth at bedtime.   metoprolol succinate 50 MG 24 hr tablet Commonly known as: TOPROL-XL Take 50 mg by mouth 2 (two) times daily.   multivitamin with minerals Tabs tablet Take 1 tablet by mouth at bedtime.   niacin 500 MG tablet Commonly known as: (VITAMIN B3) Take 500 mg by mouth 3 (three) times a week.   ondansetron 8 MG tablet Commonly known as: Zofran Take 1 tablet (8 mg total) by mouth every 8 (eight) hours as needed for nausea or vomiting. Start on the third day after chemotherapy.   prochlorperazine 10 MG tablet Commonly known as: COMPAZINE Take 1 tablet (10 mg total) by mouth every 6 (six) hours as needed for nausea or vomiting.   Synthroid 75 MCG tablet Generic drug: levothyroxine Take 75 mcg by mouth daily before breakfast.   Systane 0.4-0.3 % Soln Generic drug: Polyethyl Glycol-Propyl Glycol Place 1 drop into both eyes daily as needed (dry/irritated eyes.).   Trulicity 4.5 MG/0.5ML Soaj Generic drug: Dulaglutide Inject 4.5 mg into the skin once a week. Sunday   VITAMIN K2-VITAMIN D3 PO Take 1 tablet by mouth 2 (two) times a week. Tuesdays and Fridays   zolpidem 12.5 MG CR tablet Commonly known as: AMBIEN CR Take 12.5 mg by mouth at bedtime as needed for sleep.        Allergies:  Allergies  Allergen Reactions   Ibuprofen Other (See Comments)    Does not take due to kidney function    Levodopa Other (See Comments)    Increase of dopamine levels   Oxycodone Other (See Comments)    Pt cannot tolerate oxycodone- makes her "loopy"/ AMS    Past Medical History, Surgical history, Social history, and Family  History were reviewed and updated.  Review of Systems: All other 10 point review of systems is negative.   Physical Exam:  vitals were not taken for this visit.   Wt Readings from Last 3 Encounters:  02/06/24 214 lb 12.8 oz (97.4 kg)  01/21/24 227 lb 1.2 oz (103 kg)  01/09/24 229 lb (103.9 kg)    Ocular: Sclerae unicteric, pupils equal, round and reactive to light Ear-nose-throat: Oropharynx clear, dentition fair Lymphatic: No cervical, supraclavicular or axillary adenopathy Lungs no rales or rhonchi, good excursion bilaterally Heart regular rate  and rhythm, no murmur appreciated Abd soft, nontender, positive bowel sounds MSK no focal spinal tenderness, no joint edema Neuro: non-focal, well-oriented, appropriate affect Breasts: Same as above.   Lab Results  Component Value Date   WBC 10.5 02/06/2024   HGB 11.9 (L) 02/06/2024   HCT 35.5 (L) 02/06/2024   MCV 96.2 02/06/2024   PLT 287 02/06/2024   Lab Results  Component Value Date   FERRITIN 239 01/21/2024   IRON 25 (L) 01/21/2024   TIBC 218 (L) 01/21/2024   UIBC 193 01/21/2024   IRONPCTSAT 11 01/21/2024   Lab Results  Component Value Date   RETICCTPCT 2.6 01/21/2024   RBC 3.69 (L) 02/06/2024   No results found for: "KPAFRELGTCHN", "LAMBDASER", "KAPLAMBRATIO" No results found for: "IGGSERUM", "IGA", "IGMSERUM" No results found for: "TOTALPROTELP", "ALBUMINELP", "A1GS", "A2GS", "BETS", "BETA2SER", "GAMS", "MSPIKE", "SPEI"   Chemistry      Component Value Date/Time   NA 136 02/06/2024 0855   K 4.2 02/06/2024 0855   CL 101 02/06/2024 0855   CO2 26 02/06/2024 0855   BUN 38 (H) 02/06/2024 0855   CREATININE 1.53 (H) 02/06/2024 0855      Component Value Date/Time   CALCIUM 11.5 (H) 02/06/2024 0855   ALKPHOS 68 02/06/2024 0855   AST 13 (L) 02/06/2024 0855   ALT 16 02/06/2024 0855   BILITOT 0.6 02/06/2024 0855       Impression and Plan: Ms. Tilley is a very nice 78 yo postmenopausal caucasian female with a  second breast cancer.  The first breast cancer was about 32 years ago treated with chemotherapy and radiation after her lumpectomy. She now has a second breast cancer treated with mastectomy. Due to the high Oncotype score, we are giving her adjuvant chemotherapy. So far she is tolerating nicely.  We will proceed with her 4th and final cycle today as planned.  Follow-up with MD in 8 weeks to discuss starting an aromatase inhibitor and future scans as indicated.   Eileen Stanford, NP 3/21/202510:10 AM

## 2024-03-01 ENCOUNTER — Inpatient Hospital Stay

## 2024-03-01 VITALS — BP 135/63 | HR 94 | Temp 97.9°F | Resp 20

## 2024-03-01 DIAGNOSIS — Z5111 Encounter for antineoplastic chemotherapy: Secondary | ICD-10-CM | POA: Diagnosis not present

## 2024-03-01 DIAGNOSIS — C50011 Malignant neoplasm of nipple and areola, right female breast: Secondary | ICD-10-CM

## 2024-03-01 MED ORDER — PEGFILGRASTIM-FPGK 6 MG/0.6ML ~~LOC~~ SOSY
6.0000 mg | PREFILLED_SYRINGE | Freq: Once | SUBCUTANEOUS | Status: AC
Start: 2024-03-01 — End: 2024-03-01
  Administered 2024-03-01: 6 mg via SUBCUTANEOUS
  Filled 2024-03-01: qty 0.6

## 2024-03-01 NOTE — Patient Instructions (Signed)

## 2024-03-02 ENCOUNTER — Other Ambulatory Visit: Payer: Self-pay

## 2024-03-02 ENCOUNTER — Encounter: Payer: Self-pay | Admitting: Hematology & Oncology

## 2024-03-12 ENCOUNTER — Other Ambulatory Visit: Payer: Self-pay

## 2024-03-15 ENCOUNTER — Other Ambulatory Visit: Payer: Self-pay | Admitting: Hematology & Oncology

## 2024-03-15 DIAGNOSIS — R911 Solitary pulmonary nodule: Secondary | ICD-10-CM

## 2024-03-19 ENCOUNTER — Ambulatory Visit (HOSPITAL_BASED_OUTPATIENT_CLINIC_OR_DEPARTMENT_OTHER)
Admission: RE | Admit: 2024-03-19 | Discharge: 2024-03-19 | Disposition: A | Discharge status: Home / Self Care | Source: Ambulatory Visit | Attending: Hematology & Oncology | Admitting: Hematology & Oncology

## 2024-03-19 ENCOUNTER — Inpatient Hospital Stay (HOSPITAL_BASED_OUTPATIENT_CLINIC_OR_DEPARTMENT_OTHER): Admitting: Hematology & Oncology

## 2024-03-19 ENCOUNTER — Observation Stay (HOSPITAL_COMMUNITY)
Admission: RE | Admit: 2024-03-19 | Discharge: 2024-03-20 | Disposition: A | Discharge status: Home / Self Care | Source: Home / Self Care | Attending: Emergency Medicine | Admitting: Emergency Medicine

## 2024-03-19 ENCOUNTER — Inpatient Hospital Stay: Attending: Hematology & Oncology

## 2024-03-19 ENCOUNTER — Encounter: Payer: Self-pay | Admitting: Hematology & Oncology

## 2024-03-19 ENCOUNTER — Other Ambulatory Visit: Payer: Self-pay

## 2024-03-19 VITALS — BP 146/67 | HR 102 | Temp 98.7°F | Resp 18

## 2024-03-19 DIAGNOSIS — Z79899 Other long term (current) drug therapy: Secondary | ICD-10-CM | POA: Insufficient documentation

## 2024-03-19 DIAGNOSIS — N1832 Chronic kidney disease, stage 3b: Secondary | ICD-10-CM | POA: Insufficient documentation

## 2024-03-19 DIAGNOSIS — E039 Hypothyroidism, unspecified: Secondary | ICD-10-CM | POA: Insufficient documentation

## 2024-03-19 DIAGNOSIS — E875 Hyperkalemia: Secondary | ICD-10-CM | POA: Insufficient documentation

## 2024-03-19 DIAGNOSIS — I2699 Other pulmonary embolism without acute cor pulmonale: Secondary | ICD-10-CM | POA: Insufficient documentation

## 2024-03-19 DIAGNOSIS — E1142 Type 2 diabetes mellitus with diabetic polyneuropathy: Secondary | ICD-10-CM | POA: Insufficient documentation

## 2024-03-19 DIAGNOSIS — Z7901 Long term (current) use of anticoagulants: Secondary | ICD-10-CM | POA: Insufficient documentation

## 2024-03-19 DIAGNOSIS — I82409 Acute embolism and thrombosis of unspecified deep veins of unspecified lower extremity: Secondary | ICD-10-CM

## 2024-03-19 DIAGNOSIS — I82401 Acute embolism and thrombosis of unspecified deep veins of right lower extremity: Secondary | ICD-10-CM

## 2024-03-19 DIAGNOSIS — I130 Hypertensive heart and chronic kidney disease with heart failure and stage 1 through stage 4 chronic kidney disease, or unspecified chronic kidney disease: Secondary | ICD-10-CM | POA: Insufficient documentation

## 2024-03-19 DIAGNOSIS — I2694 Multiple subsegmental pulmonary emboli without acute cor pulmonale: Secondary | ICD-10-CM | POA: Insufficient documentation

## 2024-03-19 DIAGNOSIS — C50919 Malignant neoplasm of unspecified site of unspecified female breast: Secondary | ICD-10-CM | POA: Insufficient documentation

## 2024-03-19 DIAGNOSIS — I5033 Acute on chronic diastolic (congestive) heart failure: Secondary | ICD-10-CM | POA: Insufficient documentation

## 2024-03-19 DIAGNOSIS — Z95828 Presence of other vascular implants and grafts: Secondary | ICD-10-CM

## 2024-03-19 DIAGNOSIS — Z794 Long term (current) use of insulin: Secondary | ICD-10-CM | POA: Insufficient documentation

## 2024-03-19 DIAGNOSIS — I10 Essential (primary) hypertension: Secondary | ICD-10-CM | POA: Diagnosis present

## 2024-03-19 DIAGNOSIS — G4733 Obstructive sleep apnea (adult) (pediatric): Secondary | ICD-10-CM | POA: Insufficient documentation

## 2024-03-19 DIAGNOSIS — R911 Solitary pulmonary nodule: Secondary | ICD-10-CM | POA: Insufficient documentation

## 2024-03-19 DIAGNOSIS — I82402 Acute embolism and thrombosis of unspecified deep veins of left lower extremity: Secondary | ICD-10-CM

## 2024-03-19 DIAGNOSIS — I824Y2 Acute embolism and thrombosis of unspecified deep veins of left proximal lower extremity: Secondary | ICD-10-CM | POA: Insufficient documentation

## 2024-03-19 DIAGNOSIS — E785 Hyperlipidemia, unspecified: Secondary | ICD-10-CM | POA: Insufficient documentation

## 2024-03-19 DIAGNOSIS — K219 Gastro-esophageal reflux disease without esophagitis: Secondary | ICD-10-CM | POA: Insufficient documentation

## 2024-03-19 DIAGNOSIS — C50911 Malignant neoplasm of unspecified site of right female breast: Secondary | ICD-10-CM | POA: Diagnosis not present

## 2024-03-19 DIAGNOSIS — E1122 Type 2 diabetes mellitus with diabetic chronic kidney disease: Secondary | ICD-10-CM | POA: Insufficient documentation

## 2024-03-19 DIAGNOSIS — E119 Type 2 diabetes mellitus without complications: Secondary | ICD-10-CM

## 2024-03-19 HISTORY — DX: Other pulmonary embolism without acute cor pulmonale: I26.99

## 2024-03-19 LAB — COMPREHENSIVE METABOLIC PANEL WITH GFR
ALT: 19 U/L (ref 0–44)
AST: 30 U/L (ref 15–41)
Albumin: 3.4 g/dL — ABNORMAL LOW (ref 3.5–5.0)
Alkaline Phosphatase: 72 U/L (ref 38–126)
Anion gap: 10 (ref 5–15)
BUN: 36 mg/dL — ABNORMAL HIGH (ref 8–23)
CO2: 18 mmol/L — ABNORMAL LOW (ref 22–32)
Calcium: 8.5 mg/dL — ABNORMAL LOW (ref 8.9–10.3)
Chloride: 109 mmol/L (ref 98–111)
Creatinine, Ser: 1.64 mg/dL — ABNORMAL HIGH (ref 0.44–1.00)
GFR, Estimated: 32 mL/min — ABNORMAL LOW (ref >60)
Glucose, Bld: 203 mg/dL — ABNORMAL HIGH (ref 70–99)
Potassium: 5.2 mmol/L — ABNORMAL HIGH (ref 3.5–5.1)
Sodium: 137 mmol/L (ref 135–145)
Total Bilirubin: 0.8 mg/dL (ref 0.0–1.2)
Total Protein: 5.9 g/dL — ABNORMAL LOW (ref 6.5–8.1)

## 2024-03-19 LAB — CBC WITH DIFFERENTIAL/PLATELET
Abs Immature Granulocytes: 0.29 10*3/uL — ABNORMAL HIGH (ref 0.00–0.07)
Basophils Absolute: 0.1 10*3/uL (ref 0.0–0.1)
Basophils Relative: 1 %
Eosinophils Absolute: 0.1 10*3/uL (ref 0.0–0.5)
Eosinophils Relative: 1 %
HCT: 30.8 % — ABNORMAL LOW (ref 36.0–46.0)
Hemoglobin: 10.3 g/dL — ABNORMAL LOW (ref 12.0–15.0)
Immature Granulocytes: 3 %
Lymphocytes Relative: 10 %
Lymphs Abs: 1 10*3/uL (ref 0.7–4.0)
MCH: 34 pg (ref 26.0–34.0)
MCHC: 33.4 g/dL (ref 30.0–36.0)
MCV: 101.7 fL — ABNORMAL HIGH (ref 80.0–100.0)
Monocytes Absolute: 1.2 10*3/uL — ABNORMAL HIGH (ref 0.1–1.0)
Monocytes Relative: 11 %
Neutro Abs: 8.2 10*3/uL — ABNORMAL HIGH (ref 1.7–7.7)
Neutrophils Relative %: 74 %
Platelets: 276 10*3/uL (ref 150–400)
RBC: 3.03 MIL/uL — ABNORMAL LOW (ref 3.87–5.11)
RDW: 19.6 % — ABNORMAL HIGH (ref 11.5–15.5)
WBC: 10.8 10*3/uL — ABNORMAL HIGH (ref 4.0–10.5)
nRBC: 0 % (ref 0.0–0.2)

## 2024-03-19 LAB — PROTIME-INR
INR: 1.3 — ABNORMAL HIGH (ref 0.8–1.2)
Prothrombin Time: 16.6 s — ABNORMAL HIGH (ref 11.4–15.2)

## 2024-03-19 LAB — TROPONIN I (HIGH SENSITIVITY)
Troponin I (High Sensitivity): 14 ng/L (ref <18)
Troponin I (High Sensitivity): 13 ng/L (ref <18)

## 2024-03-19 MED ORDER — APIXABAN 5 MG PO TABS: ORAL_TABLET | ORAL | 0 refills | Status: DC

## 2024-03-19 MED ORDER — CHLORHEXIDINE GLUCONATE CLOTH 2 % EX PADS
6.0000 | MEDICATED_PAD | Freq: Every day | CUTANEOUS | Status: DC
  Administered 2024-03-20: 6 via TOPICAL

## 2024-03-19 MED ORDER — SODIUM CHLORIDE 0.9% FLUSH
10.0000 mL | Freq: Two times a day (BID) | INTRAVENOUS | Status: DC
Start: 2024-03-19 — End: 2024-03-20
  Administered 2024-03-19 – 2024-03-20 (×2): 10 mL

## 2024-03-19 MED ORDER — SODIUM CHLORIDE 0.9% FLUSH
10.0000 mL | Freq: Once | INTRAVENOUS | Status: AC
  Administered 2024-03-19: 10 mL via INTRAVENOUS

## 2024-03-19 MED ORDER — IOHEXOL 300 MG/ML  SOLN
60.0000 mL | Freq: Once | INTRAMUSCULAR | Status: AC | PRN
Start: 2024-03-19 — End: 2024-03-19
  Administered 2024-03-19: 60 mL via INTRAVENOUS

## 2024-03-19 MED ORDER — SODIUM CHLORIDE 0.9% FLUSH: 10.0000 mL | INTRAVENOUS | Status: DC | PRN

## 2024-03-19 MED ORDER — HEPARIN (PORCINE) 25000 UT/250ML-% IV SOLN
1350.0000 [IU]/h | INTRAVENOUS | Status: DC
  Administered 2024-03-19: 1350 [IU]/h via INTRAVENOUS
  Filled 2024-03-19: qty 250

## 2024-03-19 MED ORDER — HEPARIN SOD (PORK) LOCK FLUSH 100 UNIT/ML IV SOLN
500.0000 [IU] | Freq: Once | INTRAVENOUS | Status: AC
  Administered 2024-03-19: 500 [IU] via INTRAVENOUS

## 2024-03-19 NOTE — Progress Notes (Addendum)
 Hematology and Oncology Follow Up Visit  Kristin [name] [id number] 1946/06/07 78 y.o. 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  Unfortunately, she had a CT scan that was done so that we could make sure there is nothing going on with respect to metastatic breast cancer.  There is no evidence of metastatic breast cancer.  However, she was found to have pulmonary emboli in the right lung.  Of note, her left leg is also swollen right now.  She said it started swelling about a day or so ago.  We will get her set up with a Doppler of her left leg to see if there is a thromboembolic disease.  Otherwise, she is doing okay.  There is no chest wall pain.  She has had no shortness of breath.  She has had no cough or hemoptysis.  I should mention that she actually has had a dry cough.  Currently, I would say that her performance status is probably ECOG 1.    Medications:  Current Outpatient Medications:    ALPRAZolam (XANAX) 0.25 MG tablet, Take 0.25 mg by mouth as needed for anxiety., Disp: , Rfl:    apixaban (ELIQUIS) 5 MG TABS tablet, Take 2 tablets (10mg ) twice daily for 7 days, then 1 tablet (5mg ) twice daily, Disp: 60 tablet, Rfl: 0   aspirin EC 81 MG tablet, Take 81 mg by mouth every other day. In the morning Swallow whole., Disp: , Rfl:    atorvastatin (LIPITOR) 40 MG tablet, Take 40 mg by mouth at bedtime., Disp: , Rfl:    calcium carbonate (CALCIUM 600) 600 MG TABS tablet, Take 600 mg by mouth 2 (two) times daily with a meal., Disp: , Rfl:    colestipol (COLESTID) 1 g tablet, Take 1 g by mouth daily as needed (diarrhea)., Disp: , Rfl:     cyanocobalamin (VITAMIN B12) 1000 MCG tablet, Take 1,000 mcg by mouth in the morning., Disp: , Rfl:    dexamethasone (DECADRON) 4 MG tablet, Take 2 tabs by mouth 2 times daily starting day before chemo. Then take 2 tabs daily for 2 days starting day after chemo. Take with food. (Patient taking differently: Take 4-8 mg by mouth See admin instructions. Take 8 mg by mouth 2 times daily starting day before chemo. Then take 4 mg daily for 2 days starting day after chemo. Take with food.), Disp: 30 tablet, Rfl: 1   famotidine (PEPCID) 20 MG tablet, Take 20 mg by mouth 2 (two) times daily., Disp: , Rfl:    ferrous sulfate 325 (65 FE) MG tablet, Take 325 mg by mouth daily with breakfast., Disp: , Rfl:    fluticasone (FLONASE) 50 MCG/ACT nasal spray, Place 1 spray into both nostrils daily as needed for allergies or rhinitis., Disp: , Rfl:    furosemide (LASIX) 20 MG tablet, Take 1 tablet (20 mg total) by mouth daily as needed for edema., Disp: 30 tablet, Rfl: 1   gabapentin (NEURONTIN) 300 MG capsule, Take 600 mg by mouth at bedtime., Disp: , Rfl:    hydrALAZINE (APRESOLINE) 25 MG tablet, Take 75 mg by mouth in the morning and  at bedtime., Disp: , Rfl:    isosorbide mononitrate (IMDUR) 30 MG 24 hr tablet, Take 30 mg by mouth in the morning., Disp: , Rfl:    JARDIANCE 10 MG TABS tablet, Take 10 mg by mouth in the morning., Disp: , Rfl:    levofloxacin (LEVAQUIN) 250 MG tablet, Take 250 mg by mouth daily., Disp: , Rfl:    lidocaine-prilocaine (EMLA) cream, Apply a dime size of cream to port-a-cath 1-2 hours prior to access. Cover with [employer]., Disp: 30 g, Rfl: 3   lisinopril (ZESTRIL) 20 MG tablet, Take 20 mg by mouth in the morning., Disp: , Rfl:    MAGNESIUM GLYCINATE PO, Take 300 mg by mouth in the morning and at bedtime., Disp: , Rfl:    Melatonin 10 MG TABS, Take 10 mg by mouth at bedtime., Disp: , Rfl:    metoprolol succinate (TOPROL-XL) 50 MG 24 hr tablet, Take 50 mg by mouth 2 (two) times daily.,  Disp: , Rfl:    Multiple Vitamin (MULTIVITAMIN WITH MINERALS) TABS tablet, Take 1 tablet by mouth at bedtime., Disp: , Rfl:    niacin (SLO-NIACIN) 500 MG tablet, Take 500 mg by mouth 3 (three) times a week., Disp: , Rfl:    Omega-3 Fatty Acids (FISH OIL) 1000 MG CAPS, Take 1,000 mg by mouth at bedtime., Disp: , Rfl:    ondansetron (ZOFRAN) 8 MG tablet, Take 1 tablet (8 mg total) by mouth every 8 (eight) hours as needed for nausea or vomiting. Start on the third day after chemotherapy., Disp: 30 tablet, Rfl: 1   Polyethyl Glycol-Propyl Glycol (SYSTANE) 0.4-0.3 % SOLN, Place 1 drop into both eyes daily as needed (dry/irritated eyes.)., Disp: , Rfl:    prochlorperazine (COMPAZINE) 10 MG tablet, Take 1 tablet (10 mg total) by mouth every 6 (six) hours as needed for nausea or vomiting., Disp: 30 tablet, Rfl: 1   SYNTHROID 75 MCG tablet, Take 75 mcg by mouth daily before breakfast., Disp: , Rfl:    TRULICITY 4.5 MG/0.5ML SOPN, Inject 4.5 mg into the skin once a week. Sunday, Disp: , Rfl:    Vitamin D-Vitamin K (VITAMIN K2-VITAMIN D3 PO), Take 1 tablet by mouth 2 (two) times a week. Tuesdays and Fridays, Disp: , Rfl:    zolpidem (AMBIEN CR) 12.5 MG CR tablet, Take 12.5 mg by mouth at bedtime as needed for sleep., Disp: , Rfl:    guaiFENesin (ROBITUSSIN) 100 MG/5ML liquid, Take 5 mLs by mouth every 4 (four) hours as needed for cough or to loosen phlegm. (Patient not taking: Reported on 03/19/2024), Disp: 120 mL, Rfl: 0  Allergies:  Allergies  Allergen Reactions   Ibuprofen Other (See Comments)    Does not take due to kidney function    Levodopa Other (See Comments)    Increase of dopamine levels   Oxycodone Other (See Comments)    Pt cannot tolerate oxycodone- makes her "loopy"/ AMS    Past Medical History, Surgical history, Social history, and Family History were reviewed and updated.  Review of Systems: Review of Systems  Constitutional: Negative.   HENT:  Negative.    Eyes: Negative.    Respiratory: Negative.    Cardiovascular: Negative.   Gastrointestinal: Negative.   Endocrine: Negative.   Genitourinary: Negative.    Musculoskeletal: Negative.   Skin: Negative.   Neurological: Negative.   Hematological: Negative.   Psychiatric/Behavioral: Negative.      Physical Exam: Vital signs are temperature of 98.3.  Pulse 102.  Blood pressure  146/67.  Weight is 214 pounds.    Wt Readings from Last 3 Encounters:  02/27/24 225 lb (102.1 kg)  02/06/24 214 lb 12.8 oz (97.4 kg)  01/21/24 227 lb 1.2 oz (103 kg)    Physical Exam Vitals reviewed.  Constitutional:      Comments: Her breast exam shows the right mastectomy.  This is healing.  She has no erythema or warmth.  There is no skin breakdown.  There is no right axillary adenopathy.  Left breast shows lumpectomy scar at the 12 o'clock position.  There is no mass in the left breast.  There is no double discharge.  There is no left axillary adenopathy.  HENT:     Head: Normocephalic and atraumatic.  Eyes:     Pupils: Pupils are equal, round, and reactive to light.  Cardiovascular:     Rate and Rhythm: Normal rate and regular rhythm.     Heart sounds: Normal heart sounds.  Pulmonary:     Effort: Pulmonary effort is normal.     Breath sounds: Normal breath sounds.  Abdominal:     General: Bowel sounds are normal.     Palpations: Abdomen is soft.  Musculoskeletal:        General: No tenderness or deformity. Normal range of motion.     Cervical back: Normal range of motion.  Lymphadenopathy:     Cervical: No cervical adenopathy.  Skin:    General: Skin is warm and dry.     Findings: No erythema or rash.  Neurological:     Mental Status: She is alert and oriented to person, place, and time.  Psychiatric:        Behavior: Behavior normal.        Thought Content: Thought content normal.        Judgment: Judgment normal.      Lab Results  Component Value Date   WBC 7.5 02/27/2024   HGB 10.6 (L) 02/27/2024    HCT 32.0 (L) 02/27/2024   MCV 97.9 02/27/2024   PLT 228 02/27/2024     Chemistry      Component Value Date/Time   NA 137 02/27/2024 1010   K 4.7 02/27/2024 1010   CL 107 02/27/2024 1010   CO2 19 (L) 02/27/2024 1010   BUN 48 (H) 02/27/2024 1010   CREATININE 1.50 (H) 02/27/2024 1010      Component Value Date/Time   CALCIUM 8.9 02/27/2024 1010   ALKPHOS 86 02/27/2024 1010   AST 16 02/27/2024 1010   ALT 19 02/27/2024 1010   BILITOT 0.5 02/27/2024 1010      Impression and Plan: Kristin [name] is a very nice 78 year old postmenopausal white female.  She has a second breast cancer.  The first breast cancer was about 32 years ago.  She underwent chemotherapy and radiation after her lumpectomy.  She now has a second breast cancer.  She had a mastectomy.  She has a high Oncotype score.  As such, we are giving her adjuvant chemotherapy.  We will go ahead with a 3rd cycle of Taxotere/Cytoxan.  She will get a total of 4 cycles..  She will also get her G-CSF.  She now has a pulmonary emboli.  Again, I suspect this probably is somehow related to her having the breast cancer and also having chemotherapy.  Apparently, she is having problems with the pacemaker.  She has had x-rays elsewhere.  We will put her on Eliquis.  She will start the starter pack.  I suspect  that she will be on Eliquis for a good year or more.  We will continue to follow her along and keep her regular appointments.   Josph Macho, MD 4/11/20253:00 PM  ADDENDUM: I received a call from radiology regarding her lower extremity Doppler study.  The radiologist said that she has an extensive thrombus in the left leg.  This goes up through her common femoral vein.  The radiologist says that there this is such a extensive thrombus that this probably needs to be dealt with via thrombectomy.  I called Kristin [name].  I told her what was going on.  I explained to her that we probably had to get her into the hospital and have  her evaluated for mechanical thrombectomy.  She understands.  She has been on chemotherapy.  Her blood counts I think been okay.  She does not have any metastatic breast cancer.  I do not see an obvious contraindication for a mechanical thrombectomy.  I am sure that she will need to be on heparin.  I called the [facility] to alert them that Kristin [name] will be coming in.  I am sure that she will be admitted by the hospitalist and then be evaluated by either Vascular Surgery or by IR.   [name], MD

## 2024-03-19 NOTE — ED Notes (Signed)
 Called [facility] to have patient monitored.

## 2024-03-19 NOTE — Progress Notes (Addendum)
 PHARMACY - ANTICOAGULATION CONSULT NOTE  Pharmacy Consult for heparin Indication: pulmonary embolus and DVT  Allergies  Allergen Reactions   Ibuprofen Other (See Comments)    Does not take due to kidney function    Levodopa Other (See Comments)    Increase of dopamine levels   Oxycodone Other (See Comments)    Pt cannot tolerate oxycodone- makes her "loopy"/ AMS    Patient Measurements: Weight: 97.1 kg (214 lb)  Vital Signs: Temp: 98.2 F (36.8 C) (04/11 2000) Temp Source: Oral (04/11 1407) BP: 166/87 (04/11 2000) Pulse Rate: 115 (04/11 2000)  Labs: Recent Labs    03/19/24 2014  HGB 10.3*  HCT 30.8*  PLT 276  LABPROT 16.6*  INR 1.3*    CrCl cannot be calculated (Patient's most recent lab result is older than the maximum 21 days allowed.).   Medical History: Past Medical History:  Diagnosis Date   Anemia 1992   Arthritis    oa   Breast CA (HCC) 1992   lumpectomy with chemo and radiation left breast   Chronic kidney disease stage 3    watching creatining levels sees dr nwbu   Diabetes mellitus without complication (HCC)    type 2    GERD (gastroesophageal reflux disease)    Hiatal hernia    High cholesterol    Hypertension    Hypothyroidism    Personal history of radiation therapy    Presence of permanent cardiac pacemaker    [employer]   Pulmonary embolism (HCC) 03/19/2024   Sleep apnea     Assessment: 60 YOF presenting with positive CT and [place] for DVT and PE from outpt, she was prescribed Eliquis today and took 10mg  around 1700.    Goal of Therapy:  Heparin level 0.3-0.7 units/ml aPTT 66-102 seconds Monitor platelets by anticoagulation protocol: Yes   Plan:  Heparin gtt at 1350 units/hr, no bolus d/t Eliquis loading dose taken prior to arrival F/u 8 hour aPTT/heparin level F/u VVS recs, long term [facility] plan  Daylene Posey, PharmD, [facility] Clinical Pharmacist ED Pharmacist Phone # [phone] 03/19/2024 8:37 PM

## 2024-03-19 NOTE — ED Provider Notes (Signed)
 [place] EMERGENCY DEPARTMENT AT [facility] Provider Note   CSN: [id number] Arrival date & time: 03/19/24  1953     History  Chief Complaint  Patient presents with   PE / DVT    Kristin [name] is a 78 y.o. female.  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  Reports that she was at Dr. Gustavo Lah office today for her husband's appointment, and mentioned a chest x-ray that showed something.  He ordered a CT of her chest to evaluate for signs of metastatic breast cancer which she did not find, however he did find bilateral pulmonary emboli.  He called her back to the office and also noted left lower extremity swelling and ordered a DVT study which showed a large DVT extending from common femoral vein distally.  She reports that she has actually been doing better.  Has been working with physical therapy without feeling any dyspnea while she is working with them.  She denies any shortness of breath, chest pain.  She reports that her ankles always swell a little bit when she sits for long periods of time and typically the left is worse than the right, so did not think much of her left lower extremity being more swollen when it started in the last 4 days.  Reports that she was trying to get caught up on work and did sit for long periods of time on Tuesday.  She noted the swelling after that.  She denies any pain in the leg.  No color changes.  She has not had any dizziness or lightheadedness.  Dr. Myna Hidalgo initially ordered Eliquis for her, however with DVT study also positive sent her to the emergency  department for further care.  Past Medical History:  Diagnosis Date   Anemia 1992   Arthritis    oa   Breast CA (HCC) 1992   lumpectomy with chemo and radiation left breast   Chronic kidney disease stage 3    watching creatining levels sees dr nwbu   Diabetes mellitus without complication (HCC)    type 2    GERD (gastroesophageal reflux disease)    Hiatal hernia    High cholesterol    Hypertension    Hypothyroidism    Personal history of radiation therapy    Presence of permanent cardiac pacemaker    Medtronic   Pulmonary embolism (HCC) 03/19/2024   Sleep apnea      Home Medications Prior to Admission medications   Medication Sig Start Date End Date Taking? Authorizing Provider  ALPRAZolam Prudy Feeler) 0.25 MG tablet Take 0.25 mg by mouth as needed for sleep or anxiety. 08/07/23  Yes [provider]  apixaban (ELIQUIS) 5 MG TABS tablet Take 2 tablets (10mg ) twice daily for 7 days, then 1 tablet (5mg ) twice daily 03/19/24  Yes Ennever, Rose Phi, MD  atorvastatin (LIPITOR) 40 MG tablet Take 40 mg by mouth at bedtime.   Yes [provider]  calcium carbonate (CALCIUM 600) 600 MG TABS tablet Take 300 mg by mouth every other day.   Yes [provider]  colestipol (COLESTID) 1 g tablet Take 1 g by mouth daily  as needed (diarrhea). 09/06/22  Yes [provider]  cyanocobalamin (VITAMIN B12) 1000 MCG tablet Take 1,000 mcg by mouth in the morning.   Yes [provider]  famotidine (PEPCID) 20 MG tablet Take 20 mg by mouth 2 (two) times daily.   Yes [provider]  ferrous sulfate 325 (65 FE) MG tablet Take 325-750 mg by mouth See admin instructions. Take 2 tablet in the morning and 1 tablet at night   Yes [provider]  fluticasone (FLONASE) 50 MCG/ACT nasal spray Place 1 spray into both nostrils daily as needed for allergies or rhinitis.   Yes [provider]  furosemide (LASIX) 20 MG tablet Take 1 tablet (20 mg total) by  mouth daily as needed for edema. 01/26/24  Yes Ozell Blunt, MD  gabapentin (NEURONTIN) 300 MG capsule Take 600 mg by mouth at bedtime. 04/06/20  Yes [provider]  JARDIANCE 10 MG TABS tablet Take 10 mg by mouth in the morning. 07/30/22  Yes [provider]  lidocaine-prilocaine (EMLA) cream Apply a dime size of cream to port-a-cath 1-2 hours prior to access. Cover with [employer]. 12/19/23  Yes Ivor Mars, MD  MAGNESIUM GLYCINATE PO Take 300 mg by mouth daily.   Yes [provider]  Melatonin 10 MG TABS Take 10 mg by mouth at bedtime.   Yes [provider]  metoprolol succinate (TOPROL-XL) 50 MG 24 hr tablet Take 50 mg by mouth daily. 06/22/22  Yes [provider]  Multiple Vitamin (MULTIVITAMIN WITH MINERALS) TABS tablet Take 1 tablet by mouth at bedtime.   Yes [provider]  niacin (SLO-NIACIN) 500 MG tablet Take 500 mg by mouth 3 (three) times a week. 12/28/19  Yes [provider]  Omega-3 Fatty Acids (FISH OIL) 1000 MG CAPS Take 1,000 mg by mouth at bedtime.   Yes [provider]  ondansetron (ZOFRAN) 8 MG tablet Take 1 tablet (8 mg total) by mouth every 8 (eight) hours as needed for nausea or vomiting. Start on the third day after chemotherapy. 11/24/23  Yes Ennever, Sherryll Donald, MD  Polyethyl Glycol-Propyl Glycol (SYSTANE) 0.4-0.3 % SOLN Place 1 drop into both eyes daily as needed (dry/irritated eyes.).   Yes [provider]  prochlorperazine (COMPAZINE) 10 MG tablet Take 1 tablet (10 mg total) by mouth every 6 (six) hours as needed for nausea or vomiting. 11/24/23  Yes Ennever, Sherryll Donald, MD  SYNTHROID 75 MCG tablet Take 75 mcg by mouth daily before breakfast. 04/07/20  Yes [provider]  TRULICITY 4.5 MG/0.5ML SOPN Inject 4.5 mg into the skin once a week. Sunday 09/08/23  Yes [provider]  Vitamin D-Vitamin K (VITAMIN K2-VITAMIN D3 PO) Take 1 tablet by mouth 2 (two) times a week. Tuesdays and  Fridays 08/10/23  Yes [provider]  zolpidem (AMBIEN CR) 12.5 MG CR tablet Take 12.5 mg by mouth at bedtime as needed for sleep. 01/06/19  Yes [provider]  aspirin EC 81 MG tablet Take 81 mg by mouth every other day. In the morning Swallow whole. Patient not taking: Reported on 03/19/2024    [provider]  dexamethasone (DECADRON) 4 MG tablet Take 2 tabs by mouth 2 times daily starting day before chemo. Then take 2 tabs daily for 2 days starting day after chemo. Take with food. Patient not taking: Reported on 03/19/2024 11/24/23   Ivor Mars, MD  hydrALAZINE (APRESOLINE) 25 MG tablet Take 75 mg by mouth in  the morning and at bedtime. Patient not taking: Reported on 03/19/2024 12/25/18   [provider]  isosorbide mononitrate (IMDUR) 30 MG 24 hr tablet Take 30 mg by mouth in the morning. Patient not taking: Reported on 03/19/2024 08/21/22   [provider]  levofloxacin (LEVAQUIN) 250 MG tablet Take 250 mg by mouth daily. Patient not taking: Reported on 03/19/2024 01/30/24   [provider]  lisinopril (ZESTRIL) 20 MG tablet Take 20 mg by mouth in the morning. Patient not taking: Reported on 03/19/2024    [provider]      Allergies    Ibuprofen, Levodopa, and Oxycodone    Review of Systems   Review of Systems  Physical Exam Updated Vital Signs BP (!) 144/85 (BP Location: Right Arm)   Pulse 83   Temp 97.9 F (36.6 C) (Oral)   Resp 17   Ht 5' 4.5" (1.638 m)   Wt 101.7 kg   SpO2 98%   BMI 37.89 kg/m  Physical Exam Vitals and nursing note reviewed.  Constitutional:      General: She is not in acute distress.    Appearance: She is well-developed. She is not diaphoretic.  HENT:     Head: Normocephalic and atraumatic.  Eyes:     Conjunctiva/sclera: Conjunctivae normal.  Cardiovascular:     Rate and Rhythm: Normal rate and regular rhythm.     Heart sounds: Normal heart sounds. No murmur heard.    No friction  rub. No gallop.  Pulmonary:     Effort: Pulmonary effort is normal. No respiratory distress.     Breath sounds: Normal breath sounds. No wheezing or rales.  Abdominal:     General: There is no distension.     Palpations: Abdomen is soft.     Tenderness: There is no abdominal tenderness. There is no guarding.  Musculoskeletal:        General: No tenderness.     Cervical back: Normal range of motion.     Left lower leg: Edema present.  Skin:    General: Skin is warm and dry.     Findings: No erythema or rash.  Neurological:     Mental Status: She is alert and oriented to person, place, and time.     ED Results / Procedures / Treatments   Labs (all labs ordered are listed, but only abnormal results are displayed) Labs Reviewed  CBC WITH DIFFERENTIAL/PLATELET - Abnormal; Notable for the following components:      Result Value   WBC 10.8 (*)    RBC 3.03 (*)    Hemoglobin 10.3 (*)    HCT 30.8 (*)    MCV 101.7 (*)    RDW 19.6 (*)    Neutro Abs 8.2 (*)    Monocytes Absolute 1.2 (*)    Abs Immature Granulocytes 0.29 (*)    All other components within normal limits  COMPREHENSIVE METABOLIC PANEL WITH GFR - Abnormal; Notable for the following components:   Potassium 5.2 (*)    CO2 18 (*)    Glucose, Bld 203 (*)    BUN 36 (*)    Creatinine, Ser 1.64 (*)    Calcium 8.5 (*)    Total Protein 5.9 (*)    Albumin 3.4 (*)    GFR, Estimated 32 (*)    All other components within normal limits  PROTIME-INR - Abnormal; Notable for the following components:   Prothrombin Time 16.6 (*)    INR 1.3 (*)    All  other components within normal limits  BRAIN NATRIURETIC PEPTIDE - Abnormal; Notable for the following components:   B Natriuretic Peptide 414.0 (*)    All other components within normal limits  HEPARIN LEVEL (UNFRACTIONATED) - Abnormal; Notable for the following components:   Heparin Unfractionated >1.10 (*)    All other components within normal limits  APTT - Abnormal; Notable  for the following components:   aPTT 78 (*)    All other components within normal limits  CBC - Abnormal; Notable for the following components:   RBC 2.59 (*)    Hemoglobin 8.6 (*)    HCT 26.3 (*)    MCV 101.5 (*)    RDW 19.1 (*)    All other components within normal limits  GLUCOSE, CAPILLARY - Abnormal; Notable for the following components:   Glucose-Capillary 105 (*)    All other components within normal limits  GLUCOSE, CAPILLARY  BASIC METABOLIC PANEL WITH GFR  APTT  TROPONIN I (HIGH SENSITIVITY)  TROPONIN I (HIGH SENSITIVITY)    EKG None  Radiology US  Venous Img Lower Unilateral Left (DVT) Addendum Date: 03/19/2024 ADDENDUM REPORT: 03/19/2024 17:06 ADDENDUM: The results of this examination were discussed with Dr. Maria Shiner, by phone, at 17:05 on 03/19/2024. Electronically Signed   By: Reagan Camera M.D.   On: 03/19/2024 17:06   Result Date: 03/19/2024 CLINICAL DATA:  New pulmonary embolism, left leg swelling EXAM: LEFT LOWER EXTREMITY VENOUS DOPPLER ULTRASOUND TECHNIQUE: Gray-scale sonography with compression, as well as color and duplex ultrasound, were performed to evaluate the deep venous system(s) from the level of the common femoral vein through the popliteal and proximal calf veins. COMPARISON:  None Available. FINDINGS: VENOUS Near occlusive thrombus is present within the left common femoral vein and flow is detected within the greater saphenous vein. Occlusive thrombus is favored within the proximal and mid segments of the femoral vein. Flow is detected within the distal segment of the femoral vein. Near occlusive thrombus is present within the popliteal vein. The distal popliteal vein demonstrates monophasic flow. The peroneal veins are noncompressible. No significant flow is detected within the posterior tibial veins. OTHER The left greater saphenous vein is grossly patent. Limitations: none IMPRESSION: 1. An extensive left lower extremity DVT is present which involves the  common femoral vein and extends into the femoral, popliteal, and tibial veins. Electronically Signed: By: Reagan Camera M.D. On: 03/19/2024 16:45   CT Chest W Contrast Addendum Date: 03/19/2024 ADDENDUM REPORT: 03/19/2024 12:57 ADDENDUM: Critical Value/emergent results were called by telephone at the time of interpretation on 03/19/2024 at 12:57 pm to provider Gray Layman , who verbally acknowledged these results. Electronically Signed   By: Deboraha Fallow M.D.   On: 03/19/2024 12:57   Result Date: 03/19/2024 CLINICAL DATA:  Invasive breast cancer. Pre therapy staging. Pulmonary nodule. * Tracking Code: BO * EXAM: CT CHEST WITH CONTRAST TECHNIQUE: Multidetector CT imaging of the chest was performed during intravenous contrast administration. RADIATION DOSE REDUCTION: This exam was performed according to the departmental dose-optimization program which includes automated exposure control, adjustment of the mA and/or kV according to patient size and/or use of iterative reconstruction technique. CONTRAST:  60mL OMNIPAQUE IOHEXOL 300 MG/ML  SOLN COMPARISON:  CT 11/28/2023 FINDINGS: Cardiovascular: There is a filling defect within the proximal RIGHT lower lobe and RIGHT middle lobe pulmonary artery (image 51/series [id number]. This filling defect extends into the RIGHT lower lobe pulmonary arteries. Findings consistent acute or subacute pulmonary embolism. The emboli are partially occlusive. Overall clot burden  is moderate. No RIGHT ventricular strain. Mediastinum/Nodes: No axillary or supraclavicular adenopathy. No mediastinal or hilar adenopathy. No pericardial fluid. Esophagus normal. Port in the anterior chest wall with tip in distal SVC. LEFT mastectomy anatomy and axillary nodal dissection noted. Lungs/Pleura: No pulmonary infarction. No pneumonia. No pleural fluid. No pneumothorax Upper Abdomen: Large hiatal hernia extends posterior to the heart. Musculoskeletal: No aggressive osseous lesion. IMPRESSION: 1. Acute  or subacute pulmonary emboli in the RIGHT middle lobe and RIGHT lower lobe pulmonary arteries. Overall clot burden is moderate. No RIGHT ventricular strain. 2. No evidence of pulmonary metastasis. 3. Large hiatal hernia. 4. LEFT mastectomy and axillary nodal dissection. Electronically Signed: By: Deboraha Fallow M.D. On: 03/19/2024 12:23    Procedures Procedures    Medications Ordered in ED Medications  heparin ADULT infusion 100 units/mL (25000 units/250mL) (1,350 Units/hr Intravenous Infusion Verify 03/20/24 0658)  sodium chloride flush (NS) 0.9 % injection 10-40 mL (10 mLs Intracatheter Given 03/20/24 0832)  sodium chloride flush (NS) 0.9 % injection 10-40 mL (has no administration in time range)  Chlorhexidine Gluconate Cloth 2 % PADS 6 each (6 each Topical Given 03/20/24 0832)  atorvastatin (LIPITOR) tablet 40 mg (has no administration in time range)  famotidine (PEPCID) tablet 20 mg (20 mg Oral Given 03/20/24 0606)  gabapentin (NEURONTIN) capsule 600 mg (has no administration in time range)  metoprolol succinate (TOPROL-XL) 24 hr tablet 50 mg (50 mg Oral Given 03/20/24 0831)  levothyroxine (SYNTHROID) tablet 75 mcg (75 mcg Oral Given 03/20/24 0606)  acetaminophen (TYLENOL) tablet 650 mg (has no administration in time range)    Or  acetaminophen (TYLENOL) suppository 650 mg (has no administration in time range)  insulin aspart (novoLOG) injection 0-9 Units ( Subcutaneous Not Given 03/20/24 0814)  sodium zirconium cyclosilicate (LOKELMA) packet 10 g (10 g Oral Given 03/20/24 0241)    ED Course/ Medical Decision Making/ A&P                                  78 year old female with a history of hypertension, hyperlipidemia, type 2 diabetes, CKD stage III, pacemaker in place, OSA, left-sided breast cancer at age 89, diagnosis of breast cancer again last year with 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  Reviewed the imaging that was completed as an outpatient including a CT chest with contrast which showed acute or subacute pulmonary emboli in the right middle lobe, right middle lobe with moderate clot burden and no ventricular strain, and DVT study showing extensive left lower extremity DVT which involves the common femoral vein and extends in the femoral-popliteal and tibial veins.  Labs are completed and personally evaluated interpreted by me which showed normal troponin, mild elevation in BNP, stable hemoglobin, mild increase in potassium and creatinine.    EKG completed and personally eval and interpreted by me shows no acute changes compared to prior.  Regarding PE-she has normal vital signs, is asymptomatic, with normal troponins and no signs of heart strain.  She was initiated on heparin.  Regarding DVT-no clinical signs of phlegmasia.  No compartment syndrome, normal pulses, and no significant pain.  Consulted Dr. Christia Cowboy of vascular surgery who will evaluate her in the morning given extensive nature of the DVT.    Will admit to hospitalist service on heparin drip.  Final Clinical Impression(s) / ED Diagnoses Final diagnoses:  Other acute pulmonary embolism without acute cor pulmonale (HCC)  DVT, lower extremity, proximal, acute, left [facility])    Rx / DC Orders ED Discharge Orders     None         Scarlette Currier, MD 03/20/24 1006

## 2024-03-19 NOTE — Patient Instructions (Signed)

## 2024-03-19 NOTE — ED Triage Notes (Signed)
 Patient advised by her MD to go to ER for treatment/admission due to  abnormal CT chest / left leg ultrasound results today . Results shows right PE and left leg DVT . Respirations unlabored /no chest pain .

## 2024-03-20 DIAGNOSIS — C50919 Malignant neoplasm of unspecified site of unspecified female breast: Secondary | ICD-10-CM

## 2024-03-20 DIAGNOSIS — N1832 Chronic kidney disease, stage 3b: Secondary | ICD-10-CM

## 2024-03-20 DIAGNOSIS — E875 Hyperkalemia: Secondary | ICD-10-CM

## 2024-03-20 DIAGNOSIS — I82412 Acute embolism and thrombosis of left femoral vein: Secondary | ICD-10-CM | POA: Diagnosis not present

## 2024-03-20 DIAGNOSIS — I82402 Acute embolism and thrombosis of unspecified deep veins of left lower extremity: Secondary | ICD-10-CM

## 2024-03-20 DIAGNOSIS — I2699 Other pulmonary embolism without acute cor pulmonale: Secondary | ICD-10-CM | POA: Diagnosis not present

## 2024-03-20 DIAGNOSIS — I82409 Acute embolism and thrombosis of unspecified deep veins of unspecified lower extremity: Secondary | ICD-10-CM

## 2024-03-20 LAB — CBC
HCT: 26.3 % — ABNORMAL LOW (ref 36.0–46.0)
Hemoglobin: 8.6 g/dL — ABNORMAL LOW (ref 12.0–15.0)
MCH: 33.2 pg (ref 26.0–34.0)
MCHC: 32.7 g/dL (ref 30.0–36.0)
MCV: 101.5 fL — ABNORMAL HIGH (ref 80.0–100.0)
Platelets: 198 10*3/uL (ref 150–400)
RBC: 2.59 MIL/uL — ABNORMAL LOW (ref 3.87–5.11)
RDW: 19.1 % — ABNORMAL HIGH (ref 11.5–15.5)
WBC: 5.3 10*3/uL (ref 4.0–10.5)
nRBC: 0 % (ref 0.0–0.2)

## 2024-03-20 LAB — BASIC METABOLIC PANEL WITH GFR
Anion gap: 13 (ref 5–15)
BUN: 27 mg/dL — ABNORMAL HIGH (ref 8–23)
CO2: 19 mmol/L — ABNORMAL LOW (ref 22–32)
Calcium: 8.7 mg/dL — ABNORMAL LOW (ref 8.9–10.3)
Chloride: 105 mmol/L (ref 98–111)
Creatinine, Ser: 1.48 mg/dL — ABNORMAL HIGH (ref 0.44–1.00)
GFR, Estimated: 36 mL/min — ABNORMAL LOW (ref >60)
Glucose, Bld: 135 mg/dL — ABNORMAL HIGH (ref 70–99)
Potassium: 4.4 mmol/L (ref 3.5–5.1)
Sodium: 137 mmol/L (ref 135–145)

## 2024-03-20 LAB — HEPARIN LEVEL (UNFRACTIONATED): Heparin Unfractionated: >1.1 [IU]/mL — ABNORMAL HIGH (ref 0.30–0.70)

## 2024-03-20 LAB — GLUCOSE, CAPILLARY
Glucose-Capillary: 105 mg/dL — ABNORMAL HIGH (ref 70–99)
Glucose-Capillary: 98 mg/dL (ref 70–99)
Glucose-Capillary: 108 mg/dL — ABNORMAL HIGH (ref 70–99)

## 2024-03-20 LAB — BRAIN NATRIURETIC PEPTIDE: B Natriuretic Peptide: 414 pg/mL — ABNORMAL HIGH (ref 0.0–100.0)

## 2024-03-20 LAB — APTT: aPTT: 78 s — ABNORMAL HIGH (ref 24–36)

## 2024-03-20 MED ORDER — ATORVASTATIN CALCIUM 40 MG PO TABS: 40.0000 mg | ORAL_TABLET | Freq: Every day | ORAL | Status: DC

## 2024-03-20 MED ORDER — INSULIN ASPART 100 UNIT/ML IJ SOLN: 0.0000 [IU] | INTRAMUSCULAR | Status: DC

## 2024-03-20 MED ORDER — ACETAMINOPHEN 650 MG RE SUPP: 650.0000 mg | Freq: Four times a day (QID) | RECTAL | Status: DC | PRN

## 2024-03-20 MED ORDER — APIXABAN 5 MG PO TABS: 5.0000 mg | ORAL_TABLET | Freq: Two times a day (BID) | ORAL | Status: DC

## 2024-03-20 MED ORDER — FAMOTIDINE 20 MG PO TABS
20.0000 mg | ORAL_TABLET | Freq: Two times a day (BID) | ORAL | Status: DC
Start: 2024-03-20 — End: 2024-03-20
  Administered 2024-03-20: 20 mg via ORAL
  Filled 2024-03-20: qty 1

## 2024-03-20 MED ORDER — SODIUM ZIRCONIUM CYCLOSILICATE 10 G PO PACK
10.0000 g | PACK | Freq: Once | ORAL | Status: AC
  Administered 2024-03-20: 10 g via ORAL
  Filled 2024-03-20: qty 1

## 2024-03-20 MED ORDER — HEPARIN SOD (PORK) LOCK FLUSH 100 UNIT/ML IV SOLN
500.0000 [IU] | INTRAVENOUS | Status: AC | PRN
  Administered 2024-03-20: 500 [IU]

## 2024-03-20 MED ORDER — APIXABAN 5 MG PO TABS
10.0000 mg | ORAL_TABLET | Freq: Two times a day (BID) | ORAL | Status: DC
  Administered 2024-03-20: 10 mg via ORAL
  Filled 2024-03-20: qty 2

## 2024-03-20 MED ORDER — GABAPENTIN 300 MG PO CAPS
600.0000 mg | ORAL_CAPSULE | Freq: Every day | ORAL | Status: DC
Start: 2024-03-20 — End: 2024-03-20

## 2024-03-20 MED ORDER — LEVOTHYROXINE SODIUM 75 MCG PO TABS
75.0000 ug | ORAL_TABLET | Freq: Every day | ORAL | Status: DC
  Administered 2024-03-20: 75 ug via ORAL
  Filled 2024-03-20: qty 1

## 2024-03-20 MED ORDER — METOPROLOL SUCCINATE ER 50 MG PO TB24
50.0000 mg | ORAL_TABLET | Freq: Every day | ORAL | Status: DC
  Administered 2024-03-20: 50 mg via ORAL
  Filled 2024-03-20: qty 1

## 2024-03-20 MED ORDER — ACETAMINOPHEN 325 MG PO TABS: 650.0000 mg | ORAL_TABLET | Freq: Four times a day (QID) | ORAL | Status: DC | PRN

## 2024-03-20 NOTE — Progress Notes (Addendum)
 Addendum: MD opts to switch from heparin to eliquis.   PHARMACY - ANTICOAGULATION CONSULT NOTE  Pharmacy Consult for heparin Indication: pulmonary embolus and DVT  Patient Measurements: Height: 5' 4.5" (163.8 cm) Weight: 101.7 kg (224 lb 3.3 oz) IBW/kg (Calculated) : 55.85 HEPARIN DW (KG): 79.4  Vital Signs: Temp: 97.8 F (36.6 C) (04/12 0302) Temp Source: Oral (04/12 0302) BP: 140/72 (04/12 0302) Pulse Rate: 87 (04/12 0302)  Labs: Recent Labs    03/19/24 2014 03/19/24 2015 03/19/24 2229 03/20/24 0500  HGB 10.3*  --   --   --   HCT 30.8*  --   --   --   PLT 276  --   --   --   APTT  --   --   --  78*  LABPROT 16.6*  --   --   --   INR 1.3*  --   --   --   HEPARINUNFRC  --   --   --  >1.10*  CREATININE 1.64*  --   --   --   TROPONINIHS  --  14 13  --     Estimated Creatinine Clearance: 33.1 mL/min (A) (by C-G formula based on SCr of 1.64 mg/dL (H)).   Medical History: Past Medical History:  Diagnosis Date   Anemia 1992   Arthritis    oa   Breast CA (HCC) 1992   lumpectomy with chemo and radiation left breast   Chronic kidney disease stage 3    watching creatining levels sees dr nwbu   Diabetes mellitus without complication (HCC)    type 2    GERD (gastroesophageal reflux disease)    Hiatal hernia    High cholesterol    Hypertension    Hypothyroidism    Personal history of radiation therapy    Presence of permanent cardiac pacemaker    [employer]tr[employer]   Pulmonary embolism (HCC) 03/19/2024   Sleep apnea     Assessment: 77 YOF presenting with positive CT and US  for DVT and PE from outpt, she was prescribed Eliquis today and took 10mg  around 1700.    AM: heparin level >1.1 (recent Eliquis use) and aPTT 78 seconds (therapeutic) on 1350 units/hr. Per RN, no issues with infusion running continuously or signs/symptoms of bleeding.   Goal of Therapy:  Heparin level 0.3-0.7 units/ml aPTT 66-102 seconds Monitor platelets by anticoagulation protocol: Yes    Plan:  Continue heparin gtt at 1350 units/hr F/u 8 hour confirmatory aPTT aPTT daily until correlates with heparin level F/u VVS recs, long term [facility] plan  Young Hensen, PharmD. Clinical Pharmacist 03/20/2024 5:41 AM

## 2024-03-20 NOTE — Care Management Obs Status (Signed)
 MEDICARE OBSERVATION STATUS NOTIFICATION   Patient Details  Name: Kristin [name] MRN: [id number] Date of Birth: [birth year]   Medicare Observation Status Notification Given:  Yes    Jannine Meo, RN 03/20/2024, 2:08 PM

## 2024-03-20 NOTE — Consult Note (Addendum)
 Hospital Consult    Reason for Consult: Left lower extremity venous thrombosis Requesting Physician: ED MRN #:  [id number]  History of Present Illness: This is a 78 y.o. female who presented to the hospital after CT scan demonstrated pulmonary embolism with follow-up bilateral lower extremity DVT study noting left-sided femoral DVT.  Vascular surgery was called for further management.  On exam, Kristin [name] was resting comfortably.  She was unaware that she had a DVT until receiving the ultrasound results. She has a history of breast cancer, with recurrence, recently having undergone 3 cycles of chemotherapy.  She states recently she has been less active, working in the dependent position at a computer.  She is working with physical therapy in an effort to get stronger.  Denies sensorimotor deficits in the left foot, does appreciate some asymmetric swelling at the level of the ankle.  Notes bilateral lower extremities have edema by the end of the day at baseline.  Past Medical History:  Diagnosis Date   Anemia 1992   Arthritis    oa   Breast CA (HCC) 1992   lumpectomy with chemo and radiation left breast   Chronic kidney disease stage 3    watching creatining levels sees dr nwbu   Diabetes mellitus without complication (HCC)    type 2    GERD (gastroesophageal reflux disease)    Hiatal hernia    High cholesterol    Hypertension    Hypothyroidism    Personal history of radiation therapy    Presence of permanent cardiac pacemaker    [employer]   Pulmonary embolism (HCC) 03/19/2024   Sleep apnea     Past Surgical History:  Procedure Laterality Date   BACK SURGERY     upper and lower upper back has clamp   BICEPT TENODESIS Left 05/27/2023   Procedure: BICEPS TENODESIS;  Surgeon: Jasmine Mesi, MD;  Location: [facility] OR;  Service: Orthopedics;  Laterality: Left;   BREAST LUMPECTOMY Left    and lymph nodes removed (16-18)   CARDIAC CATHETERIZATION  01/29/2022   CATARACT EXTRACTION  Bilateral    CHOLECYSTECTOMY     COLONOSCOPY  10/2022   PORTACATH PLACEMENT N/A 12/16/2023   Procedure: PORT PLACEMENT WITH ULTRASOUND GUIDANCE;  Surgeon: Enid Harry, MD;  Location: [facility] OR;  Service: General;  Laterality: N/A;   REVERSE SHOULDER ARTHROPLASTY Left 05/27/2023   Procedure: LEFT REVERSE SHOULDER ARTHROPLASTY;  Surgeon: Jasmine Mesi, MD;  Location: [facility] OR;  Service: Orthopedics;  Laterality: Left;   SIMPLE MASTECTOMY WITH AXILLARY SENTINEL NODE BIOPSY Left 10/21/2023   Procedure: LEFT MASTECTOMY;  Surgeon: Enid Harry, MD;  Location: [facility] OR;  Service: General;  Laterality: Left;  90 MINUTES LMA PEC BLOCK   TONSILLECTOMY     TOTAL KNEE ARTHROPLASTY Left 09/12/2017   Procedure: LEFT TOTAL KNEE ARTHROPLASTY;  Surgeon: Arnie Lao, MD;  Location: [facility] ORS;  Service: Orthopedics;  Laterality: Left;   TUBAL LIGATION      Allergies  Allergen Reactions   Ibuprofen Other (See Comments)    Does not take due to kidney function NSAIDS   Levodopa Other (See Comments)    Increase of dopamine levels   Oxycodone Other (See Comments)    Pt cannot tolerate oxycodone- makes her "loopy"/ AMS    Prior to Admission medications   Medication Sig Start Date End Date Taking? Authorizing Provider  ALPRAZolam (XANAX) 0.25 MG tablet Take 0.25 mg by mouth as needed for sleep or anxiety. 08/07/23  Yes [provider]  apixaban (ELIQUIS) 5 MG TABS tablet Take 2 tablets (10mg ) twice daily for 7 days, then 1 tablet (5mg ) twice daily 03/19/24  Yes Ennever, Sherryll Donald, MD  atorvastatin (LIPITOR) 40 MG tablet Take 40 mg by mouth at bedtime.   Yes [provider]  calcium carbonate (CALCIUM 600) 600 MG TABS tablet Take 300 mg by mouth every other day.   Yes [provider]  colestipol (COLESTID) 1 g tablet Take 1 g by mouth daily as needed (diarrhea). 09/06/22  Yes [provider]  cyanocobalamin (VITAMIN B12) 1000 MCG tablet Take 1,000 mcg by mouth in  the morning.   Yes [provider]  famotidine (PEPCID) 20 MG tablet Take 20 mg by mouth 2 (two) times daily.   Yes [provider]  ferrous sulfate 325 (65 FE) MG tablet Take 325-750 mg by mouth See admin instructions. Take 2 tablet in the morning and 1 tablet at night   Yes [provider]  fluticasone (FLONASE) 50 MCG/ACT nasal spray Place 1 spray into both nostrils daily as needed for allergies or rhinitis.   Yes [provider]  furosemide (LASIX) 20 MG tablet Take 1 tablet (20 mg total) by mouth daily as needed for edema. 01/26/24  Yes Ozell Blunt, MD  gabapentin (NEURONTIN) 300 MG capsule Take 600 mg by mouth at bedtime. 04/06/20  Yes [provider]  JARDIANCE 10 MG TABS tablet Take 10 mg by mouth in the morning. 07/30/22  Yes [provider]  lidocaine-prilocaine (EMLA) cream Apply a dime size of cream to port-a-cath 1-2 hours prior to access. Cover with [employer]. 12/19/23  Yes Ivor Mars, MD  MAGNESIUM GLYCINATE PO Take 300 mg by mouth daily.   Yes [provider]  Melatonin 10 MG TABS Take 10 mg by mouth at bedtime.   Yes [provider]  metoprolol succinate (TOPROL-XL) 50 MG 24 hr tablet Take 50 mg by mouth daily. 06/22/22  Yes [provider]  Multiple Vitamin (MULTIVITAMIN WITH MINERALS) TABS tablet Take 1 tablet by mouth at bedtime.   Yes [provider]  niacin (SLO-NIACIN) 500 MG tablet Take 500 mg by mouth 3 (three) times a week. 12/28/19  Yes [provider]  Omega-3 Fatty Acids (FISH OIL) 1000 MG CAPS Take 1,000 mg by mouth at bedtime.   Yes [provider]  ondansetron (ZOFRAN) 8 MG tablet Take 1 tablet (8 mg total) by mouth every 8 (eight) hours as needed for nausea or vomiting. Start on the third day after chemotherapy. 11/24/23  Yes Ennever, Sherryll Donald, MD  Polyethyl Glycol-Propyl Glycol (SYSTANE) 0.4-0.3 % SOLN Place 1 drop into both eyes daily as needed (dry/irritated  eyes.).   Yes [provider]  prochlorperazine (COMPAZINE) 10 MG tablet Take 1 tablet (10 mg total) by mouth every 6 (six) hours as needed for nausea or vomiting. 11/24/23  Yes Ennever, Sherryll Donald, MD  SYNTHROID 75 MCG tablet Take 75 mcg by mouth daily before breakfast. 04/07/20  Yes [provider]  TRULICITY 4.5 MG/0.5ML SOPN Inject 4.5 mg into the skin once a week. Sunday 09/08/23  Yes [provider]  Vitamin D-Vitamin K (VITAMIN K2-VITAMIN D3 PO) Take 1 tablet by mouth 2 (two) times a week. Tuesdays and Fridays 08/10/23  Yes [provider]  zolpidem (AMBIEN CR) 12.5 MG CR tablet Take 12.5 mg by mouth at bedtime as needed for sleep. 01/06/19  Yes [provider]  aspirin EC 81 MG tablet Take  81 mg by mouth every other day. In the morning Swallow whole. Patient not taking: Reported on 03/19/2024    [provider]  dexamethasone (DECADRON) 4 MG tablet Take 2 tabs by mouth 2 times daily starting day before chemo. Then take 2 tabs daily for 2 days starting day after chemo. Take with food. Patient not taking: Reported on 03/19/2024 11/24/23   Ivor Mars, MD  hydrALAZINE (APRESOLINE) 25 MG tablet Take 75 mg by mouth in the morning and at bedtime. Patient not taking: Reported on 03/19/2024 12/25/18   [provider]  isosorbide mononitrate (IMDUR) 30 MG 24 hr tablet Take 30 mg by mouth in the morning. Patient not taking: Reported on 03/19/2024 08/21/22   [provider]  levofloxacin (LEVAQUIN) 250 MG tablet Take 250 mg by mouth daily. Patient not taking: Reported on 03/19/2024 01/30/24   [provider]  lisinopril (ZESTRIL) 20 MG tablet Take 20 mg by mouth in the morning. Patient not taking: Reported on 03/19/2024    [provider]    Social History   Socioeconomic History   Marital status: Married    Spouse name: Not on file   Number of children: 4   Years of education: Not on file   Highest education level:  Not on file  Occupational History   Not on file  Tobacco Use   Smoking status: Never   Smokeless tobacco: Never  Vaping Use   Vaping status: Never Used  Substance and Sexual Activity   Alcohol use: Not Currently    Comment: maybe 1 glass of wine per year   Drug use: No   Sexual activity: Yes    Birth control/protection: Post-menopausal  Other Topics Concern   Not on file  Social History Narrative   Not on file   Social Drivers of Health   Financial Resource Strain: Low Risk  (08/28/2023)   Received from [facility]   Overall Financial Resource Strain (CARDIA)    Difficulty of Paying Living Expenses: Not hard at all  Food Insecurity: No Food Insecurity (01/21/2024)   Hunger Vital Sign    Worried About Running Out of Food in the Last Year: Never true    Ran Out of Food in the Last Year: Never true  Transportation Needs: No Transportation Needs (01/21/2024)   PRAPARE - Administrator, Civil Service (Medical): No    Lack of Transportation (Non-Medical): No  Physical Activity: Insufficiently Active (10/08/2021)   Received from [facility]   Exercise Vital Sign    Days of Exercise per Week: 4 days    Minutes of Exercise per Session: 20 min  Stress: No Stress Concern Present (09/25/2023)   Received from [facility] of Occupational Health - Occupational Stress Questionnaire    Feeling of Stress : Not at all  Recent Concern: Stress - Stress Concern Present (09/23/2023)   Received from [facility] of Occupational Health - Occupational Stress Questionnaire    Feeling of Stress : To some extent  Social Connections: Socially Integrated (01/21/2024)   Social Connection and Isolation Panel [NHANES]    Frequency of Communication with Friends and Family: More than three times a week    Frequency of Social Gatherings with Friends and Family: Once a week    Attends Religious Services: 1 to 4 times per year    Active  Member of [employer] or Organizations: No    Attends Banker  Meetings: 1 to 4 times per year    Marital Status: Married  Catering manager Violence: Not At Risk (01/21/2024)   Humiliation, Afraid, Rape, and Kick questionnaire    Fear of Current or Ex-Partner: No    Emotionally Abused: No    Physically Abused: No    Sexually Abused: No    Family History  Problem Relation Age of Onset   Breast cancer Mother 35       metastatic   Cancer Maternal Uncle 38 - 58       unknown type   Breast cancer Cousin 44 - 30       maternal first cousin   Breast cancer Cousin 62       maternal first cousin    ROS: Otherwise negative unless mentioned in HPI  Physical Examination  Vitals:   03/20/24 0302 03/20/24 0812  BP: (!) 140/72 (!) 144/85  Pulse: 87 83  Resp: 20 17  Temp: 97.8 F (36.6 C) 97.9 F (36.6 C)  SpO2: 99% 98%   Body mass index is 37.89 kg/m.  General:  WDWN in NAD Gait: Not observed HENT: WNL, normocephalic Pulmonary: normal non-labored breathing, without Rales, rhonchi,  wheezing Cardiac: regular Abdomen:  soft, NT/ND, no masses Skin: without rashes Vascular Exam/Pulses: 2+ PTs Extremities: without ischemic changes, without Gangrene , without cellulitis; without open wounds;  Large legs at baseline, slight edema appreciated in the left leg as compared to the right especially at the level of the ankle.  1+ pitting edema. Musculoskeletal: no muscle wasting or atrophy  Neurologic: A&O X 3;  No focal weakness or paresthesias are detected; speech is fluent/normal Psychiatric:  The pt has Normal affect. Lymph:  Unremarkable  CBC    Component Value Date/Time   WBC 5.3 03/20/2024 0500   RBC 2.59 (L) 03/20/2024 0500   HGB 8.6 (L) 03/20/2024 0500   HGB 10.6 (L) 02/27/2024 1010   HCT 26.3 (L) 03/20/2024 0500   PLT 198 03/20/2024 0500   PLT 228 02/27/2024 1010   MCV 101.5 (H) 03/20/2024 0500   MCH 33.2 03/20/2024 0500   MCHC 32.7 03/20/2024 0500   RDW 19.1 (H)  03/20/2024 0500   LYMPHSABS 1.0 03/19/2024 2014   MONOABS 1.2 (H) 03/19/2024 2014   EOSABS 0.1 03/19/2024 2014   BASOSABS 0.1 03/19/2024 2014    BMET    Component Value Date/Time   NA 137 03/19/2024 2014   K 5.2 (H) 03/19/2024 2014   CL 109 03/19/2024 2014   CO2 18 (L) 03/19/2024 2014   GLUCOSE 203 (H) 03/19/2024 2014   BUN 36 (H) 03/19/2024 2014   CREATININE 1.64 (H) 03/19/2024 2014   CREATININE 1.50 (H) 02/27/2024 1010   CALCIUM 8.5 (L) 03/19/2024 2014   GFRNONAA 32 (L) 03/19/2024 2014   GFRNONAA 35 (L) 02/27/2024 1010   GFRAA 34 (L) 04/27/2020 0149    COAGS: Lab Results  Component Value Date   INR 1.3 (H) 03/19/2024   INR 1.0 01/20/2024   INR 1.0 04/25/2020     Non-Invasive Vascular Imaging:   Nonocclusive DVT in the common femoral vein.  The femoral vein is occluded however the greater saphenous vein and deep femoral vein are patent.  The DVT extends into the popliteal and tibial veins.   ASSESSMENT/PLAN: This is a 78 y.o. female with a symptomatic left lower extremity DVT.  While she does have a small amount of swelling.  She is able to perform all the tasks of daily living, including physical  therapy without issue.  She was asymptomatic, prior to the lower extremity DVT ultrasound.  I had a long conversation with Kristin [name].  Our 2 options are continued medical management and percutaneous mechanical thrombectomy.  Literature demonstrates significant benefit with percutaneous mechanical thrombectomy in patients who are symptomatic, or have extensive DVTs with complete thrombosis of the common femoral vein and extending into the iliac veins.  Trials demonstrate no significant improvement in patients with her level of thrombus, similar rates of reocclusion regardless of treatment and no significant change in risk of post thrombotic syndrome moving forward.  I had a long conversation with Kristin [name] regarding the above.  I do not think that she would have significant benefit with  percutaneous mechanical thrombectomy.  She would be best treated with continued medical management being lower extremity compression and elevation with anticoagulation.  She was under the impression that she would be getting surgery.  We discussed that even with percutaneous mechanical thrombectomy, I would not be able to remove the clot in the tibial vessels and the majority of the popliteal.  Percutaneous thrombectomy would only ' clean out ' the femoral vein, and we would be using the medication to hopefully clear up the thrombus distally.    My plan is to have her follow-up in our DVT clinic in 1 week to assess her symptoms.  Should she remain asymptomatic, my plan would be to continue anticoagulation indefinitely.  I have ordered compression stockings to the left lower extremity.  She would best served with thigh-high compression, however due to the large size of her legs bilaterally, she may only be able to wear knee-high. No restrictions.  Recommend that she continue living an active lifestyle, elevate when able.  Return to PT.    Kayla Part MD [facility] [phone] 03/20/2024  9:50 AM

## 2024-03-20 NOTE — Progress Notes (Signed)
   03/20/24 0158  BiPAP/CPAP/SIPAP  $ Non-Invasive Ventilator  Non-Invasive Vent Subsequent  BiPAP/CPAP/SIPAP Pt Type Adult  BiPAP/CPAP/SIPAP Resmed  Mask Type Nasal pillows  Dentures removed? Not applicable  IPAP 20 cmH20  EPAP 8 cmH2O  Flow Rate 21 lpm  Patient Home Machine Yes  Safety Check Completed by RT for Home Unit Yes, no issues noted  Patient Home Mask Yes  Patient Home Tubing Yes  Auto Titrate Yes  CPAP/SIPAP surface wiped down Yes  Device Plugged into RED Power Outlet Yes  BiPAP/CPAP /SiPAP Vitals  Pulse Rate 80  Resp 17  Bilateral Breath Sounds Clear;Diminished  MEWS Score/Color  MEWS Score 0  MEWS Score Color Green

## 2024-03-20 NOTE — Consult Note (Signed)
 [name] is now admitted up to 4 E.  She has a large thrombus in the left leg.  In talking with the radiologist yesterday, he felt that this would be amenable for thrombectomy.  She has been very active.  I do not want to see her the limited because of any potential issues with any complications from this blood clot.  She is on heparin.  She says there is no pain in the left leg.  She does have some swelling in the left leg.  I have to believe that this blood clot is relatively acute.  She said her leg only started swelling up 2 days ago.  She has a pulmonary embolism.  I am sure this is secondary to the thrombus in the left leg.  She does have some mild renal insufficiency.  Again this is chronic.  I do not see any evidence of congestive heart failure.  Labs are pending today.   Her vital signs show a temperature of 97.8.  Pulse 87.  Blood pressure 140/72.  Oxygen saturation is 99%.  Head and neck exam shows no ocular or oral lesions.  There are no palpable cervical or supraclavicular lymph nodes.  Lungs are clear bilaterally.  She has good air movement bilaterally.  Cardiac exam regular rate and rhythm.  She has no murmurs.  Abdomen is obese but soft.  She has decent bowel sounds.  There is no fluid wave.  Extremity shows swelling in the left leg.  There is not as much tightness as there was yesterday in the left leg.  She has no pain with palpation.  I cannot palpate any obvious venous cord.  She has decent pulses bilaterally.  Neurological exam shows no focal neurological deficits.  Skin exam is unremarkable.   Ms. [name] has early stage breast cancer.  She has been on adjuvant chemotherapy.  Her blood counts have been okay.  She now has a large thrombus in the left leg.  It extends up into the common femoral vein.  Again, I think that she would be a candidate for thrombectomy.  From my point of view, I do not see any contraindication from a oncologic point of view.  Again, her  cancer is not metastatic.  She has this resected and is on adjuvant therapy.  Hopefully, she will be able to have a thrombectomy just to help minimize complications down the road.  I do appreciate everybody's help.  I know that she will get incredible care from all the staff up on 4 E.   Rayleen Cal, MD  1 Donata Fryer 4:10

## 2024-03-20 NOTE — H&P (Signed)
 History and Physical    Kristin [name] NWG:[id number] DOB: [birth year] DOA: 03/19/2024  PCP: Bert Britain I, NP  Patient coming from: Home  Chief Complaint: Acute PE and DVT  HPI: Kristin [name] is a 78 y.o. female with medical history significant of breast cancer status post mastectomy 10/2023 and chemotherapy, type 2 diabetes, CKD stage IIIb, bifascicular block status post PPM, hypothyroidism, hypertension, hyperlipidemia, GERD, chronic HFpEF, OSA on CPAP, anxiety.  She was admitted to the hospital 2 months ago for pneumonia, CHF exacerbation, diarrhea, and worsening anemia. Patient is currently being sent to the hospital by oncology due to outpatient workup done yesterday revealing acute PE and extensive left lower extremity DVT.  Sent here to be evaluated by vascular surgery to see if she will be a candidate for mechanical thrombectomy for her extensive left lower extremity DVT.  CT chest with contrast showing acute to subacute PE in the right middle lobe and right lower lobe pulmonary arteries with overall moderate clot burden and no evidence of right ventricular strain.  Left lower extremity Doppler showing extensive DVT involving the common femoral vein and extending into the femoral, popliteal, and tibial veins.   Patient denies chest pain or shortness of breath.  She is reporting mild swelling of her left lower extremity which has now improved.  Denies any pain in her leg.  Denies recent long distance travel.  States she was treated with 4 rounds of chemotherapy for her breast cancer and finished her last round of chemotherapy last month on 3/21.  She has no other complaints.  ED Course: Tachycardic to the 110s on arrival and heart rate subsequently improved.  Blood pressure stable and satting 98-100% on room air.  Labs notable for WBC count 10.8, hemoglobin 10.3 (stable), platelet count 276k, potassium 5.2, bicarb 18 (slightly low on previous labs as well), glucose 203, BUN 36,  creatinine 1.6 (stable since labs done a few weeks ago), troponin negative x 2, BNP pending. Patient was started on IV heparin.  ED physician discussed with Dr. Christia Cowboy and vascular surgery will consult in the morning.  Review of Systems:  Review of Systems  All other systems reviewed and are negative.   Past Medical History:  Diagnosis Date   Anemia 1992   Arthritis    oa   Breast CA (HCC) 1992   lumpectomy with chemo and radiation left breast   Chronic kidney disease stage 3    watching creatining levels sees dr nwbu   Diabetes mellitus without complication (HCC)    type 2    GERD (gastroesophageal reflux disease)    Hiatal hernia    High cholesterol    Hypertension    Hypothyroidism    Personal history of radiation therapy    Presence of permanent cardiac pacemaker    [employer]   Pulmonary embolism (HCC) 03/19/2024   Sleep apnea     Past Surgical History:  Procedure Laterality Date   BACK SURGERY     upper and lower upper back has clamp   BICEPT TENODESIS Left 05/27/2023   Procedure: BICEPS TENODESIS;  Surgeon: Jasmine Mesi, MD;  Location: [facility] OR;  Service: Orthopedics;  Laterality: Left;   BREAST LUMPECTOMY Left    and lymph nodes removed (16-18)   CARDIAC CATHETERIZATION  01/29/2022   CATARACT EXTRACTION Bilateral    CHOLECYSTECTOMY     COLONOSCOPY  10/2022   PORTACATH PLACEMENT N/A 12/16/2023   Procedure: PORT PLACEMENT WITH ULTRASOUND GUIDANCE;  Surgeon: Enid Harry, MD;  Location: [facility] OR;  Service: General;  Laterality: N/A;   REVERSE SHOULDER ARTHROPLASTY Left 05/27/2023   Procedure: LEFT REVERSE SHOULDER ARTHROPLASTY;  Surgeon: Jasmine Mesi, MD;  Location: [facility] OR;  Service: Orthopedics;  Laterality: Left;   SIMPLE MASTECTOMY WITH AXILLARY SENTINEL NODE BIOPSY Left 10/21/2023   Procedure: LEFT MASTECTOMY;  Surgeon: Enid Harry, MD;  Location: [facility] OR;  Service: General;  Laterality: Left;  90 MINUTES LMA PEC BLOCK   TONSILLECTOMY      TOTAL KNEE ARTHROPLASTY Left 09/12/2017   Procedure: LEFT TOTAL KNEE ARTHROPLASTY;  Surgeon: Arnie Lao, MD;  Location: [facility] ORS;  Service: Orthopedics;  Laterality: Left;   TUBAL LIGATION       reports that she has never smoked. She has never used smokeless tobacco. She reports that she does not currently use alcohol. She reports that she does not use drugs.  Allergies  Allergen Reactions   Ibuprofen Other (See Comments)    Does not take due to kidney function NSAIDS   Levodopa Other (See Comments)    Increase of dopamine levels   Oxycodone Other (See Comments)    Pt cannot tolerate oxycodone- makes her "loopy"/ AMS    Family History  Problem Relation Age of Onset   Breast cancer Mother 1       metastatic   Cancer Maternal Uncle 3 - 27       unknown type   Breast cancer Cousin 61 - 54       maternal first cousin   Breast cancer Cousin 53       maternal first cousin    Prior to Admission medications   Medication Sig Start Date End Date Taking? Authorizing Provider  ALPRAZolam (XANAX) 0.25 MG tablet Take 0.25 mg by mouth as needed for sleep or anxiety. 08/07/23  Yes [provider]  apixaban (ELIQUIS) 5 MG TABS tablet Take 2 tablets (10mg ) twice daily for 7 days, then 1 tablet (5mg ) twice daily 03/19/24  Yes Ennever, Sherryll Donald, MD  atorvastatin (LIPITOR) 40 MG tablet Take 40 mg by mouth at bedtime.   Yes [provider]  calcium carbonate (CALCIUM 600) 600 MG TABS tablet Take 300 mg by mouth every other day.   Yes [provider]  colestipol (COLESTID) 1 g tablet Take 1 g by mouth daily as needed (diarrhea). 09/06/22  Yes [provider]  cyanocobalamin (VITAMIN B12) 1000 MCG tablet Take 1,000 mcg by mouth in the morning.   Yes [provider]  famotidine (PEPCID) 20 MG tablet Take 20 mg by mouth 2 (two) times daily.   Yes [provider]  ferrous sulfate 325 (65 FE) MG tablet Take 325-750 mg by mouth See admin  instructions. Take 2 tablet in the morning and 1 tablet at night   Yes [provider]  fluticasone (FLONASE) 50 MCG/ACT nasal spray Place 1 spray into both nostrils daily as needed for allergies or rhinitis.   Yes [provider]  furosemide (LASIX) 20 MG tablet Take 1 tablet (20 mg total) by mouth daily as needed for edema. 01/26/24  Yes Ozell Blunt, MD  gabapentin (NEURONTIN) 300 MG capsule Take 600 mg by mouth at bedtime. 04/06/20  Yes [provider]  JARDIANCE 10 MG TABS tablet Take 10 mg by mouth in the morning. 07/30/22  Yes [provider]  lidocaine-prilocaine (EMLA) cream Apply a dime size of cream to port-a-cath 1-2 hours prior to access. Cover with [employer]. 12/19/23  Yes Ennever,  Sherryll Donald, MD  MAGNESIUM GLYCINATE PO Take 300 mg by mouth daily.   Yes [provider]  Melatonin 10 MG TABS Take 10 mg by mouth at bedtime.   Yes [provider]  metoprolol succinate (TOPROL-XL) 50 MG 24 hr tablet Take 50 mg by mouth daily. 06/22/22  Yes [provider]  Multiple Vitamin (MULTIVITAMIN WITH MINERALS) TABS tablet Take 1 tablet by mouth at bedtime.   Yes [provider]  niacin (SLO-NIACIN) 500 MG tablet Take 500 mg by mouth 3 (three) times a week. 12/28/19  Yes [provider]  Omega-3 Fatty Acids (FISH OIL) 1000 MG CAPS Take 1,000 mg by mouth at bedtime.   Yes [provider]  ondansetron (ZOFRAN) 8 MG tablet Take 1 tablet (8 mg total) by mouth every 8 (eight) hours as needed for nausea or vomiting. Start on the third day after chemotherapy. 11/24/23  Yes Ennever, Sherryll Donald, MD  Polyethyl Glycol-Propyl Glycol (SYSTANE) 0.4-0.3 % SOLN Place 1 drop into both eyes daily as needed (dry/irritated eyes.).   Yes [provider]  prochlorperazine (COMPAZINE) 10 MG tablet Take 1 tablet (10 mg total) by mouth every 6 (six) hours as needed for nausea or vomiting. 11/24/23  Yes Ennever, Sherryll Donald, MD  SYNTHROID 75  MCG tablet Take 75 mcg by mouth daily before breakfast. 04/07/20  Yes [provider]  TRULICITY 4.5 MG/0.5ML SOPN Inject 4.5 mg into the skin once a week. Sunday 09/08/23  Yes [provider]  Vitamin D-Vitamin K (VITAMIN K2-VITAMIN D3 PO) Take 1 tablet by mouth 2 (two) times a week. Tuesdays and Fridays 08/10/23  Yes [provider]  zolpidem (AMBIEN CR) 12.5 MG CR tablet Take 12.5 mg by mouth at bedtime as needed for sleep. 01/06/19  Yes [provider]  aspirin EC 81 MG tablet Take 81 mg by mouth every other day. In the morning Swallow whole. Patient not taking: Reported on 03/19/2024    [provider]  dexamethasone (DECADRON) 4 MG tablet Take 2 tabs by mouth 2 times daily starting day before chemo. Then take 2 tabs daily for 2 days starting day after chemo. Take with food. Patient not taking: Reported on 03/19/2024 11/24/23   Ivor Mars, MD  hydrALAZINE (APRESOLINE) 25 MG tablet Take 75 mg by mouth in the morning and at bedtime. Patient not taking: Reported on 03/19/2024 12/25/18   [provider]  isosorbide mononitrate (IMDUR) 30 MG 24 hr tablet Take 30 mg by mouth in the morning. Patient not taking: Reported on 03/19/2024 08/21/22   [provider]  levofloxacin (LEVAQUIN) 250 MG tablet Take 250 mg by mouth daily. Patient not taking: Reported on 03/19/2024 01/30/24   [provider]  lisinopril (ZESTRIL) 20 MG tablet Take 20 mg by mouth in the morning. Patient not taking: Reported on 03/19/2024    [provider]    Physical Exam: Vitals:   03/19/24 2200 03/19/24 2215 03/19/24 2356 03/20/24 0049  BP: 135/66 137/76  (!) 142/85  Pulse: 87 88  85  Resp: 18 (!) 22  14  Temp:   98 F (36.7 C) 98.4 F (36.9 C)  TempSrc:   Oral Oral  SpO2: 100% 100%  98%  Weight:    101.7 kg  Height:    5' 4.5" (1.638 m)    Physical Exam Vitals reviewed.  Constitutional:      General: She is not in acute distress. HENT:      Head: Normocephalic  and atraumatic.  Eyes:     Extraocular Movements: Extraocular movements intact.  Cardiovascular:     Rate and Rhythm: Normal rate and regular rhythm.     Pulses: Normal pulses.  Pulmonary:     Effort: Pulmonary effort is normal. No respiratory distress.     Breath sounds: Normal breath sounds. No wheezing or rales.  Abdominal:     General: Bowel sounds are normal. There is no distension.     Palpations: Abdomen is soft.     Tenderness: There is no abdominal tenderness. There is no guarding.  Musculoskeletal:     Cervical back: Normal range of motion.     Comments: Left lower extremity neurovascularly intact.  No significant swelling.  No cyanosis.  No signs of compartment syndrome.  Skin:    General: Skin is warm and dry.  Neurological:     General: No focal deficit present.     Mental Status: She is alert and oriented to person, place, and time.     Labs on Admission: I have personally reviewed following labs and imaging studies  CBC: Recent Labs  Lab 03/19/24 2014  WBC 10.8*  NEUTROABS 8.2*  HGB 10.3*  HCT 30.8*  MCV 101.7*  PLT 276   Basic Metabolic Panel: Recent Labs  Lab 03/19/24 2014  NA 137  K 5.2*  CL 109  CO2 18*  GLUCOSE 203*  BUN 36*  CREATININE 1.64*  CALCIUM 8.5*   GFR: Estimated Creatinine Clearance: 33.1 mL/min (A) (by C-G formula based on SCr of 1.64 mg/dL (H)). Liver Function Tests: Recent Labs  Lab 03/19/24 2014  AST 30  ALT 19  ALKPHOS 72  BILITOT 0.8  PROT 5.9*  ALBUMIN 3.4*   No results for input(s): "LIPASE", "AMYLASE" in the last 168 hours. No results for input(s): "AMMONIA" in the last 168 hours. Coagulation Profile: Recent Labs  Lab 03/19/24 2014  INR 1.3*   Cardiac Enzymes: No results for input(s): "CKTOTAL", "CKMB", "CKMBINDEX", "TROPONINI" in the last 168 hours. BNP (last 3 results) No results for input(s): "PROBNP" in the last 8760 hours. HbA1C: No results for input(s): "HGBA1C" in the  last 72 hours. CBG: No results for input(s): "GLUCAP" in the last 168 hours. Lipid Profile: No results for input(s): "CHOL", "HDL", "LDLCALC", "TRIG", "CHOLHDL", "LDLDIRECT" in the last 72 hours. Thyroid Function Tests: No results for input(s): "TSH", "T4TOTAL", "FREET4", "T3FREE", "THYROIDAB" in the last 72 hours. Anemia Panel: No results for input(s): "VITAMINB12", "FOLATE", "FERRITIN", "TIBC", "IRON", "RETICCTPCT" in the last 72 hours. Urine analysis:    Component Value Date/Time   COLORURINE YELLOW 01/20/2024 1433   APPEARANCEUR CLEAR 01/20/2024 1433   LABSPEC 1.020 01/20/2024 1433   PHURINE 5.5 01/20/2024 1433   GLUCOSEU >=500 (A) 01/20/2024 1433   HGBUR NEGATIVE 01/20/2024 1433   BILIRUBINUR NEGATIVE 01/20/2024 1433   KETONESUR NEGATIVE 01/20/2024 1433   PROTEINUR NEGATIVE 01/20/2024 1433   NITRITE NEGATIVE 01/20/2024 1433   LEUKOCYTESUR NEGATIVE 01/20/2024 1433    Radiological Exams on Admission: US  Venous Img Lower Unilateral Left (DVT) Addendum Date: 03/19/2024 ADDENDUM REPORT: 03/19/2024 17:06 ADDENDUM: The results of this examination were discussed with Dr. Maria Shiner, by phone, at 17:05 on 03/19/2024. Electronically Signed   By: Reagan Camera M.D.   On: 03/19/2024 17:06   Result Date: 03/19/2024 CLINICAL DATA:  New pulmonary embolism, left leg swelling EXAM: LEFT LOWER EXTREMITY VENOUS DOPPLER ULTRASOUND TECHNIQUE: Gray-scale sonography with compression, as well as color and duplex ultrasound, were performed to evaluate the deep venous  system(s) from the level of the common femoral vein through the popliteal and proximal calf veins. COMPARISON:  None Available. FINDINGS: VENOUS Near occlusive thrombus is present within the left common femoral vein and flow is detected within the greater saphenous vein. Occlusive thrombus is favored within the proximal and mid segments of the femoral vein. Flow is detected within the distal segment of the femoral vein. Near occlusive thrombus is  present within the popliteal vein. The distal popliteal vein demonstrates monophasic flow. The peroneal veins are noncompressible. No significant flow is detected within the posterior tibial veins. OTHER The left greater saphenous vein is grossly patent. Limitations: none IMPRESSION: 1. An extensive left lower extremity DVT is present which involves the common femoral vein and extends into the femoral, popliteal, and tibial veins. Electronically Signed: By: Reagan Camera M.D. On: 03/19/2024 16:45   CT Chest W Contrast Addendum Date: 03/19/2024 ADDENDUM REPORT: 03/19/2024 12:57 ADDENDUM: Critical Value/emergent results were called by telephone at the time of interpretation on 03/19/2024 at 12:57 pm to provider Gray Layman , who verbally acknowledged these results. Electronically Signed   By: Deboraha Fallow M.D.   On: 03/19/2024 12:57   Result Date: 03/19/2024 CLINICAL DATA:  Invasive breast cancer. Pre therapy staging. Pulmonary nodule. * Tracking Code: BO * EXAM: CT CHEST WITH CONTRAST TECHNIQUE: Multidetector CT imaging of the chest was performed during intravenous contrast administration. RADIATION DOSE REDUCTION: This exam was performed according to the departmental dose-optimization program which includes automated exposure control, adjustment of the mA and/or kV according to patient size and/or use of iterative reconstruction technique. CONTRAST:  60mL OMNIPAQUE IOHEXOL 300 MG/ML  SOLN COMPARISON:  CT 11/28/2023 FINDINGS: Cardiovascular: There is a filling defect within the proximal RIGHT lower lobe and RIGHT middle lobe pulmonary artery (image 51/series [id number]. This filling defect extends into the RIGHT lower lobe pulmonary arteries. Findings consistent acute or subacute pulmonary embolism. The emboli are partially occlusive. Overall clot burden is moderate. No RIGHT ventricular strain. Mediastinum/Nodes: No axillary or supraclavicular adenopathy. No mediastinal or hilar adenopathy. No pericardial fluid.  Esophagus normal. Port in the anterior chest wall with tip in distal SVC. LEFT mastectomy anatomy and axillary nodal dissection noted. Lungs/Pleura: No pulmonary infarction. No pneumonia. No pleural fluid. No pneumothorax Upper Abdomen: Large hiatal hernia extends posterior to the heart. Musculoskeletal: No aggressive osseous lesion. IMPRESSION: 1. Acute or subacute pulmonary emboli in the RIGHT middle lobe and RIGHT lower lobe pulmonary arteries. Overall clot burden is moderate. No RIGHT ventricular strain. 2. No evidence of pulmonary metastasis. 3. Large hiatal hernia. 4. LEFT mastectomy and axillary nodal dissection. Electronically Signed: By: Deboraha Fallow M.D. On: 03/19/2024 12:23    EKG: Interpretation limited secondary to paced rhythm.  Assessment and Plan  Acute to subacute PE In the setting of known history of breast cancer.  CT chest with contrast showing acute to subacute PE in the right middle lobe and right lower lobe pulmonary arteries with overall moderate clot burden and no evidence of right ventricular strain.  Patient is currently hemodynamically stable.  Not tachycardic, hypoxic, or hypotensive.  BNP 414 but troponin negative x 2. -Continue IV heparin  Extensive left lower extremity DVT Left lower extremity Doppler showing extensive DVT involving the common femoral vein and extending into the femoral, popliteal, and tibial veins.  Left lower extremity is neurovascularly intact.  No cyanosis.  No significant swelling.  No signs of phlegmasia cerulea dolens or compartment syndrome at this time. -Continue IV heparin -Vascular surgery consulted  to see if she will be a candidate for mechanical thrombectomy -Keep n.p.o. after midnight except sips with meds  Acute on chronic HFpEF Last echo done in October 2024 at [facility] showing EF 50%. Patient does not appear volume overloaded on exam.  BNP elevated in the setting of acute PE although CT is not showing evidence of right heart  strain.  Repeat echocardiogram ordered.  CKD stage IIIb Creatinine stable compared to labs done a few weeks ago.  Continue to monitor renal function.  Mild hyperkalemia In the setting of CKD.  A dose of Lokelma ordered and continue to monitor labs.  Mild normal anion gap metabolic acidosis In the setting of CKD.  Continue to monitor labs and replace bicarb as needed.  Breast cancer Patient states she finished her last round of chemotherapy last month on 3/21.  Outpatient oncology follow-up.  Type 2 diabetes Glucose 203.  A1c 6.8 on 01/21/2024.  Placed on sensitive sliding scale insulin every 4 hours as patient is currently n.p.o.  Hypothyroidism Continue Synthroid.  Hypertension Continue metoprolol.  Hyperlipidemia Continue Lipitor.  OSA Continue nightly CPAP.  GERD Continue Pepcid.  Chronic anemia Hemoglobin stable.  Continue to monitor CBC.  Peripheral neuropathy Continue gabapentin.  Code Status: Full Code (discussed with the patient) Family Communication: No family available at this time. Level of care: Progressive Care Unit Admission status: It is my clinical opinion that admission to INPATIENT is reasonable and necessary because of the expectation that this patient will require hospital care that crosses at least 2 midnights to treat this condition based on the medical complexity of the problems presented.  Given the aforementioned information, the predictability of an adverse outcome is felt to be significant.  Juliette Oh MD [facility]  If 7PM-7AM, please contact night-coverage [email or web address]  03/20/2024, 1:29 AM

## 2024-03-20 NOTE — Progress Notes (Signed)
 Patient seen and examined.  Admitted early morning hours by nighttime hospitalist.   Briefly , 78 year old with history of breast cancer status post hysterectomy on neoadjuvant therapy, type 2 diabetes, CKD stage IIIb, hypothyroidism, pacemaker, sleep apnea on CPAP and anxiety found to have acute segmental PE and extensive left lower extremity DVT at oncology clinic.  She was found to have hospital for possible thrombectomy due to extensive DVT.  Clinically stable today.  Seen by vascular surgery. They had detail discussion with the patient and recommended left colectomy did not improve outcome.  Will continue heparin and subsequently converted to Eliquis on discharge.  Will communicate with her oncologist.    Same day admit,. No charge visit.

## 2024-03-20 NOTE — Discharge Summary (Signed)
 Physician Discharge Summary  Kristin [name] WUJ:[id number] DOB: 1946/05/01 DOA: 03/19/2024  PCP: Bill Salinas I, NP  Admit date: 03/19/2024 Discharge date: 03/20/2024  Admitted From: Home Disposition: Home  Recommendations for Outpatient Follow-up:  Follow up with PCP in 1-2 weeks Repeat ultrasound in 1 week. Vascular to schedule follow-up in DVT clinic.  Home Health: N/A Equipment/Devices: N/A  Discharge Condition: Stable CODE STATUS: Full code Diet recommendation: Low-salt diet  Discharge summary: 78 year old with history of breast cancer status post hysterectomy on neoadjuvant therapy, type 2 diabetes, CKD stage IIIb, hypothyroidism, pacemaker, sleep apnea on CPAP and anxiety found to have acute segmental PE and extensive left lower extremity DVT at oncology clinic.  She was sent to the hospital for possible thrombectomy due to extensive DVT.  Clinically stable today.  Seen by vascular surgery. They had detail discussion with the patient and recommended against thrombectomy due to clinical stability.   Discussed with interventional radiology, they recommended no additional benefit with thrombolysis. Patient is medically stable.  She is on room air.  She is able to walk around in the hallway without hypoxemia. Left leg with mild swelling, recommended compression stockings.  She is to continue Eliquis loading dose and maintenance dose that she was started yesterday from oncology clinic. I updated Dr. Myna Hidalgo about the plan and needs outpatient follow-up. Vascular surgery to schedule follow-up at DVT clinic and needs repeat ultrasound in 1 week. Continue all home medications.  She will avoid aspirin and NSAIDs. Stable for discharge.   Discharge Diagnoses:  Principal Problem:   Acute pulmonary embolism (HCC) Active Problems:   Type 2 diabetes mellitus (HCC)   HTN (hypertension)   HLD (hyperlipidemia)   Hypothyroidism   Breast cancer (HCC)   DVT (deep venous thrombosis)  (HCC)   Hyperkalemia   Chronic kidney disease, stage 3b [facility])    Discharge Instructions  Discharge Instructions     AMB Referral to Deep Vein Thrombosis Clinic   Complete by: As directed    Is patient currently on anticoagulation?: Yes   Diet - low sodium heart healthy   Complete by: As directed    Discharge instructions   Complete by: As directed    Compression socks legs   Increase activity slowly   Complete by: As directed       Allergies as of 03/20/2024       Reactions   Ibuprofen Other (See Comments)   Does not take due to kidney function NSAIDS   Levodopa Other (See Comments)   Increase of dopamine levels   Oxycodone Other (See Comments)   Pt cannot tolerate oxycodone- makes her "loopy"/ AMS        Medication List     STOP taking these medications    aspirin EC 81 MG tablet   hydrALAZINE 25 MG tablet Commonly known as: APRESOLINE   isosorbide mononitrate 30 MG 24 hr tablet Commonly known as: IMDUR   lisinopril 20 MG tablet Commonly known as: ZESTRIL       TAKE these medications    ALPRAZolam 0.25 MG tablet Commonly known as: XANAX Take 0.25 mg by mouth as needed for sleep or anxiety.   apixaban 5 MG Tabs tablet Commonly known as: Eliquis Take 2 tablets (10mg ) twice daily for 7 days, then 1 tablet (5mg ) twice daily   atorvastatin 40 MG tablet Commonly known as: LIPITOR Take 40 mg by mouth at bedtime.   Calcium 600 600 MG Tabs tablet Generic drug: calcium carbonate Take 300 mg by  mouth every other day.   colestipol 1 g tablet Commonly known as: COLESTID Take 1 g by mouth daily as needed (diarrhea).   cyanocobalamin 1000 MCG tablet Commonly known as: VITAMIN B12 Take 1,000 mcg by mouth in the morning.   dexamethasone 4 MG tablet Commonly known as: DECADRON Take 2 tabs by mouth 2 times daily starting day before chemo. Then take 2 tabs daily for 2 days starting day after chemo. Take with food.   famotidine 20 MG tablet Commonly  known as: PEPCID Take 20 mg by mouth 2 (two) times daily.   ferrous sulfate 325 (65 FE) MG tablet Take 325-750 mg by mouth See admin instructions. Take 2 tablet in the morning and 1 tablet at night   Fish Oil 1000 MG Caps Take 1,000 mg by mouth at bedtime.   fluticasone 50 MCG/ACT nasal spray Commonly known as: FLONASE Place 1 spray into both nostrils daily as needed for allergies or rhinitis.   furosemide 20 MG tablet Commonly known as: Lasix Take 1 tablet (20 mg total) by mouth daily as needed for edema.   gabapentin 300 MG capsule Commonly known as: NEURONTIN Take 600 mg by mouth at bedtime.   Jardiance 10 MG Tabs tablet Generic drug: empagliflozin Take 10 mg by mouth in the morning.   levofloxacin 250 MG tablet Commonly known as: LEVAQUIN Take 250 mg by mouth daily.   lidocaine-prilocaine cream Commonly known as: EMLA Apply a dime size of cream to port-a-cath 1-2 hours prior to access. Cover with [employer].   MAGNESIUM GLYCINATE PO Take 300 mg by mouth daily.   Melatonin 10 MG Tabs Take 10 mg by mouth at bedtime.   metoprolol succinate 50 MG 24 hr tablet Commonly known as: TOPROL-XL Take 50 mg by mouth daily.   multivitamin with minerals Tabs tablet Take 1 tablet by mouth at bedtime.   niacin 500 MG tablet Commonly known as: (VITAMIN B3) Take 500 mg by mouth 3 (three) times a week.   ondansetron 8 MG tablet Commonly known as: Zofran Take 1 tablet (8 mg total) by mouth every 8 (eight) hours as needed for nausea or vomiting. Start on the third day after chemotherapy.   prochlorperazine 10 MG tablet Commonly known as: COMPAZINE Take 1 tablet (10 mg total) by mouth every 6 (six) hours as needed for nausea or vomiting.   Synthroid 75 MCG tablet Generic drug: levothyroxine Take 75 mcg by mouth daily before breakfast.   Systane 0.4-0.3 % Soln Generic drug: Polyethyl Glycol-Propyl Glycol Place 1 drop into both eyes daily as needed (dry/irritated eyes.).    Trulicity 4.5 MG/0.5ML Soaj Generic drug: Dulaglutide Inject 4.5 mg into the skin once a week. Sunday   VITAMIN K2-VITAMIN D3 PO Take 1 tablet by mouth 2 (two) times a week. Tuesdays and Fridays   zolpidem 12.5 MG CR tablet Commonly known as: AMBIEN CR Take 12.5 mg by mouth at bedtime as needed for sleep.        Allergies  Allergen Reactions   Ibuprofen Other (See Comments)    Does not take due to kidney function NSAIDS   Levodopa Other (See Comments)    Increase of dopamine levels   Oxycodone Other (See Comments)    Pt cannot tolerate oxycodone- makes her "loopy"/ AMS    Consultations: Vascular surgery IR Oncology   Procedures/Studies: US  Venous Img Lower Unilateral Left (DVT) Addendum Date: 03/19/2024 ADDENDUM REPORT: 03/19/2024 17:06 ADDENDUM: The results of this examination were discussed with Dr. Maria Shiner,  by phone, at 17:05 on 03/19/2024. Electronically Signed   By: Reagan Camera M.D.   On: 03/19/2024 17:06   Result Date: 03/19/2024 CLINICAL DATA:  New pulmonary embolism, left leg swelling EXAM: LEFT LOWER EXTREMITY VENOUS DOPPLER ULTRASOUND TECHNIQUE: Gray-scale sonography with compression, as well as color and duplex ultrasound, were performed to evaluate the deep venous system(s) from the level of the common femoral vein through the popliteal and proximal calf veins. COMPARISON:  None Available. FINDINGS: VENOUS Near occlusive thrombus is present within the left common femoral vein and flow is detected within the greater saphenous vein. Occlusive thrombus is favored within the proximal and mid segments of the femoral vein. Flow is detected within the distal segment of the femoral vein. Near occlusive thrombus is present within the popliteal vein. The distal popliteal vein demonstrates monophasic flow. The peroneal veins are noncompressible. No significant flow is detected within the posterior tibial veins. OTHER The left greater saphenous vein is grossly patent.  Limitations: none IMPRESSION: 1. An extensive left lower extremity DVT is present which involves the common femoral vein and extends into the femoral, popliteal, and tibial veins. Electronically Signed: By: Reagan Camera M.D. On: 03/19/2024 16:45   CT Chest W Contrast Addendum Date: 03/19/2024 ADDENDUM REPORT: 03/19/2024 12:57 ADDENDUM: Critical Value/emergent results were called by telephone at the time of interpretation on 03/19/2024 at 12:57 pm to provider Gray Layman , who verbally acknowledged these results. Electronically Signed   By: Deboraha Fallow M.D.   On: 03/19/2024 12:57   Result Date: 03/19/2024 CLINICAL DATA:  Invasive breast cancer. Pre therapy staging. Pulmonary nodule. * Tracking Code: BO * EXAM: CT CHEST WITH CONTRAST TECHNIQUE: Multidetector CT imaging of the chest was performed during intravenous contrast administration. RADIATION DOSE REDUCTION: This exam was performed according to the departmental dose-optimization program which includes automated exposure control, adjustment of the mA and/or kV according to patient size and/or use of iterative reconstruction technique. CONTRAST:  60mL OMNIPAQUE IOHEXOL 300 MG/ML  SOLN COMPARISON:  CT 11/28/2023 FINDINGS: Cardiovascular: There is a filling defect within the proximal RIGHT lower lobe and RIGHT middle lobe pulmonary artery (image 51/series 301. This filling defect extends into the RIGHT lower lobe pulmonary arteries. Findings consistent acute or subacute pulmonary embolism. The emboli are partially occlusive. Overall clot burden is moderate. No RIGHT ventricular strain. Mediastinum/Nodes: No axillary or supraclavicular adenopathy. No mediastinal or hilar adenopathy. No pericardial fluid. Esophagus normal. Port in the anterior chest wall with tip in distal SVC. LEFT mastectomy anatomy and axillary nodal dissection noted. Lungs/Pleura: No pulmonary infarction. No pneumonia. No pleural fluid. No pneumothorax Upper Abdomen: Large hiatal hernia  extends posterior to the heart. Musculoskeletal: No aggressive osseous lesion. IMPRESSION: 1. Acute or subacute pulmonary emboli in the RIGHT middle lobe and RIGHT lower lobe pulmonary arteries. Overall clot burden is moderate. No RIGHT ventricular strain. 2. No evidence of pulmonary metastasis. 3. Large hiatal hernia. 4. LEFT mastectomy and axillary nodal dissection. Electronically Signed: By: Deboraha Fallow M.D. On: 03/19/2024 12:23   (Echo, Carotid, EGD, Colonoscopy, ERCP)    Subjective: Patient seen and examined.  Denies any complaints.  Denies any chest pain shortness of breath or palpitations.  On room air.  She does have some swelling of the left leg but denies any pain or spasms. Patient is well-informed with plan to treat with medications and follow-up.  Eager to go home.   Discharge Exam: Vitals:   03/20/24 0302 03/20/24 0812  BP: (!) 140/72 (!) 144/85  Pulse:  87 83  Resp: 20 17  Temp: 97.8 F (36.6 C) 97.9 F (36.6 C)  SpO2: 99% 98%   Vitals:   03/20/24 0049 03/20/24 0158 03/20/24 0302 03/20/24 0812  BP: (!) 142/85  (!) 140/72 (!) 144/85  Pulse: 85 80 87 83  Resp: 14 17 20 17   Temp: 98.4 F (36.9 C)  97.8 F (36.6 C) 97.9 F (36.6 C)  TempSrc: Oral  Oral Oral  SpO2: 98%  99% 98%  Weight: 101.7 kg     Height: 5' 4.5" (1.638 m)       General: Pt is alert, awake, not in acute distress Cardiovascular: RRR, S1/S2 +, no rubs, no gallops, pacemaker precordial present. Respiratory: CTA bilaterally, no wheezing, no rhonchi Abdominal: Soft, NT, ND, bowel sounds + Extremities:  Trace edema left lower extremity.  Celine Collard' sign negative.  Distal neurovascular status intact.    The results of significant diagnostics from this hospitalization (including imaging, microbiology, ancillary and laboratory) are listed below for reference.     Microbiology: No results found for this or any previous visit (from the past 240 hours).   Labs: BNP (last 3 results) Recent Labs     01/20/24 1415 03/19/24 2015  BNP 246.7* 414.0*   Basic Metabolic Panel: Recent Labs  Lab 03/19/24 2014  NA 137  K 5.2*  CL 109  CO2 18*  GLUCOSE 203*  BUN 36*  CREATININE 1.64*  CALCIUM 8.5*   Liver Function Tests: Recent Labs  Lab 03/19/24 2014  AST 30  ALT 19  ALKPHOS 72  BILITOT 0.8  PROT 5.9*  ALBUMIN 3.4*   No results for input(s): "LIPASE", "AMYLASE" in the last 168 hours. No results for input(s): "AMMONIA" in the last 168 hours. CBC: Recent Labs  Lab 03/19/24 2014 03/20/24 0500  WBC 10.8* 5.3  NEUTROABS 8.2*  --   HGB 10.3* 8.6*  HCT 30.8* 26.3*  MCV 101.7* 101.5*  PLT 276 198   Cardiac Enzymes: No results for input(s): "CKTOTAL", "CKMB", "CKMBINDEX", "TROPONINI" in the last 168 hours. BNP: Invalid input(s): "POCBNP" CBG: Recent Labs  Lab 03/20/24 0342 03/20/24 0811 03/20/24 1200  GLUCAP 105* 98 108*   D-Dimer No results for input(s): "DDIMER" in the last 72 hours. Hgb A1c No results for input(s): "HGBA1C" in the last 72 hours. Lipid Profile No results for input(s): "CHOL", "HDL", "LDLCALC", "TRIG", "CHOLHDL", "LDLDIRECT" in the last 72 hours. Thyroid function studies No results for input(s): "TSH", "T4TOTAL", "T3FREE", "THYROIDAB" in the last 72 hours.  Invalid input(s): "FREET3" Anemia work up No results for input(s): "VITAMINB12", "FOLATE", "FERRITIN", "TIBC", "IRON", "RETICCTPCT" in the last 72 hours. Urinalysis    Component Value Date/Time   COLORURINE YELLOW 01/20/2024 1433   APPEARANCEUR CLEAR 01/20/2024 1433   LABSPEC 1.020 01/20/2024 1433   PHURINE 5.5 01/20/2024 1433   GLUCOSEU >=500 (A) 01/20/2024 1433   HGBUR NEGATIVE 01/20/2024 1433   BILIRUBINUR NEGATIVE 01/20/2024 1433   KETONESUR NEGATIVE 01/20/2024 1433   PROTEINUR NEGATIVE 01/20/2024 1433   NITRITE NEGATIVE 01/20/2024 1433   LEUKOCYTESUR NEGATIVE 01/20/2024 1433   Sepsis Labs Recent Labs  Lab 03/19/24 2014 03/20/24 0500  WBC 10.8* 5.3   Microbiology No  results found for this or any previous visit (from the past 240 hours).   Time coordinating discharge: 35 minutes  SIGNED:   Vada Garibaldi, MD  [facility] 03/20/2024, 1:21 PM

## 2024-03-20 NOTE — Progress Notes (Signed)
 Mobility Specialist Progress Note:    03/20/24 1300  Mobility  Activity Ambulated with assistance to bathroom;Ambulated with assistance in room;Ambulated with assistance in hallway  Level of Assistance Modified independent, requires aide device or extra time  Assistive Device Other (Comment) (hand rails)  Distance Ambulated (ft) 100 ft  Activity Response Tolerated well  Mobility Referral Yes  Mobility visit 1 Mobility  Mobility Specialist Start Time (ACUTE ONLY) 1300  Mobility Specialist Stop Time (ACUTE ONLY) 1310  Mobility Specialist Time Calculation (min) (ACUTE ONLY) 10 min   Pt received ambulating in room, agreeable to mobility session. Ambulated in hallway with ModI using hand rails for support. Tolerated well, c/o weakness and fatigue. Returned pt to room, all needs met, eager for d/c.   Kanton Kamel Mobility Specialist Please contact via SecureChat or  Rehab office at [phone]

## 2024-03-20 NOTE — Progress Notes (Signed)
 DISCHARGE NOTE HOME [name] to be discharged Home per MD order. Discussed prescriptions and follow up appointments with the patient. Prescriptions given to patient; medication list explained in detail. Patient verbalized understanding.  Skin clean, dry and intact without evidence of skin break down, no evidence of skin tears noted. IV catheter discontinued intact. Site without signs and symptoms of complications. Dressing and pressure applied. Pt denies pain at the site currently. No complaints noted.  See LDA Left port deaccessed  An After Visit Summary (AVS) was printed and given to the patient. Patient escorted via wheelchair, and discharged home via private auto.  Tonda Francisco, RN

## 2024-03-22 ENCOUNTER — Other Ambulatory Visit (HOSPITAL_COMMUNITY): Payer: Self-pay | Admitting: Student-PharmD

## 2024-03-22 DIAGNOSIS — I82402 Acute embolism and thrombosis of unspecified deep veins of left lower extremity: Secondary | ICD-10-CM

## 2024-03-25 ENCOUNTER — Ambulatory Visit (HOSPITAL_COMMUNITY)
Admission: RE | Admit: 2024-03-25 | Discharge: 2024-03-25 | Disposition: A | Discharge status: Home / Self Care | Source: Ambulatory Visit | Attending: Vascular Surgery | Admitting: Vascular Surgery

## 2024-03-25 ENCOUNTER — Other Ambulatory Visit (HOSPITAL_COMMUNITY): Payer: Self-pay

## 2024-03-25 ENCOUNTER — Encounter (HOSPITAL_COMMUNITY): Payer: Self-pay | Admitting: Student-PharmD

## 2024-03-25 ENCOUNTER — Ambulatory Visit (HOSPITAL_BASED_OUTPATIENT_CLINIC_OR_DEPARTMENT_OTHER)
Admission: RE | Admit: 2024-03-25 | Discharge: 2024-03-25 | Disposition: A | Discharge status: Home / Self Care | Source: Ambulatory Visit | Attending: Vascular Surgery | Admitting: Vascular Surgery

## 2024-03-25 VITALS — BP 120/71 | HR 87

## 2024-03-25 DIAGNOSIS — I82402 Acute embolism and thrombosis of unspecified deep veins of left lower extremity: Secondary | ICD-10-CM | POA: Diagnosis present

## 2024-03-25 NOTE — Progress Notes (Signed)
 DVT Clinic Note  Name: Kristin [name]     MRN: [id number]     DOB: 06-23-1946     Sex: female  PCP: Vernon Prey, NP  Today's Visit: Visit Information: Initial Visit  Referred to CPP by: Dr. Karin Lieu Reason for referral:  Chief Complaint  Patient presents with   DVT   HISTORY OF PRESENT ILLNESS: Kristin [name] is a 78 y.o. female with PMH HTN, HLD, T2DM, CKD, hypothyroidism, breast cancer, who presents after diagnosis of DVT for medication management. Patient has breast cancer s/p mastectomy and currently undergoing chemotherapy, followed by Dr. Myna Hidalgo. She had a CT on 03/19/24 to evaluate for metastases which was negative, but this did show pulmonary embolism. She was seen by Dr. Myna Hidalgo and noted LLE swelling for about a day. Ultrasound showed extensive LLE DVT, and she was sent to the ED for consideration of thrombectomy. She was admitted to the hospital and evaluated by vascular surgeon Dr. Karin Lieu who had a long discussion with the patient about benefits and risks of mechanical thrombectomy. Ultimately it was decided that she would not have significant benefit with thrombectomy and would be best treated with medical management with anticoagulation, compression, and elevation. She was discharged on Eliquis with repeat ultrasound in 1 week in DVT Clinic to re-evaluate her symptoms, which was completed this afternoon. Today, Brenisha tells me that she is doing well since her discharge. She is ambulating well independently today. She has been sleeping with a wedge pillow to elevate her legs and she has been compliant with compression stockings. Reports that when she sits she has some swelling around her left ankle which has been her baseline for years. She has not had any worsening of symptoms since discharge. Today is day 7 of Eliquis, so tomorrow she will switch to taking it as 5 mg BID.   Positive Thrombotic Risk Factors: Active cancer, Older Age Bleeding Risk Factors: Age >65 years,  Cancer, Anticoagulant therapy  Negative Thrombotic Risk Factors: Previous VTE, Recent surgery (within 3 months), Recent trauma (within 3 months), Recent admission to hospital with acute illness (within 3 months), Paralysis, paresis, or recent plaster cast immobilization of lower extremity, Central venous catheterization, Bed rest >72 hours within 3 months, Sedentary journey lasting >8 hours within 4 weeks, Pregnancy, Within 6 weeks postpartum, Recent cesarean section (within 3 months), Estrogen therapy, Testosterone therapy, Erythropoiesis-stimulating agent, Recent COVID diagnosis (within 3 months), Non-malignant, chronic inflammatory condition, Known thrombophilic condition, Smoking, Obesity  Rx Insurance Coverage:  Medicare T[employer] Rx Affordability: Eliquis is $43/month  Past Medical History:  Diagnosis Date   Anemia 1992   Arthritis    oa   Breast CA (HCC) 1992   lumpectomy with chemo and radiation left breast   Chronic kidney disease stage 3    watching creatining levels sees dr nwbu   Diabetes mellitus without complication (HCC)    type 2    GERD (gastroesophageal reflux disease)    Hiatal hernia    High cholesterol    Hypertension    Hypothyroidism    Personal history of radiation therapy    Presence of permanent cardiac pacemaker    [employer]tr[employer]   Pulmonary embolism (HCC) 03/19/2024   Sleep apnea     Past Surgical History:  Procedure Laterality Date   BACK SURGERY     upper and lower upper back has clamp   BICEPT TENODESIS Left 05/27/2023   Procedure: BICEPS TENODESIS;  Surgeon: Cammy Copa, MD;  Location: [facility] OR;  Service: Orthopedics;  Laterality: Left;   BREAST LUMPECTOMY Left    and lymph nodes removed (16-18)   CARDIAC CATHETERIZATION  01/29/2022   CATARACT EXTRACTION Bilateral    CHOLECYSTECTOMY     COLONOSCOPY  10/2022   PORTACATH PLACEMENT N/A 12/16/2023   Procedure: PORT PLACEMENT WITH ULTRASOUND GUIDANCE;  Surgeon: Enid Harry, MD;  Location: [facility]  OR;  Service: General;  Laterality: N/A;   REVERSE SHOULDER ARTHROPLASTY Left 05/27/2023   Procedure: LEFT REVERSE SHOULDER ARTHROPLASTY;  Surgeon: Jasmine Mesi, MD;  Location: [facility] OR;  Service: Orthopedics;  Laterality: Left;   SIMPLE MASTECTOMY WITH AXILLARY SENTINEL NODE BIOPSY Left 10/21/2023   Procedure: LEFT MASTECTOMY;  Surgeon: Enid Harry, MD;  Location: [facility] OR;  Service: General;  Laterality: Left;  90 MINUTES LMA PEC BLOCK   TONSILLECTOMY     TOTAL KNEE ARTHROPLASTY Left 09/12/2017   Procedure: LEFT TOTAL KNEE ARTHROPLASTY;  Surgeon: Arnie Lao, MD;  Location: [facility] ORS;  Service: Orthopedics;  Laterality: Left;   TUBAL LIGATION      Social History   Socioeconomic History   Marital status: Married    Spouse name: Not on file   Number of children: 4   Years of education: Not on file   Highest education level: Not on file  Occupational History   Not on file  Tobacco Use   Smoking status: Never   Smokeless tobacco: Never  Vaping Use   Vaping status: Never Used  Substance and Sexual Activity   Alcohol use: Not Currently    Comment: maybe 1 glass of wine per year   Drug use: No   Sexual activity: Yes    Birth control/protection: Post-menopausal  Other Topics Concern   Not on file  Social History Narrative   Not on file   Social Drivers of Health   Financial Resource Strain: Low Risk  (08/28/2023)   Received from [facility]   Overall Financial Resource Strain (CARDIA)    Difficulty of Paying Living Expenses: Not hard at all  Food Insecurity: No Food Insecurity (01/21/2024)   Hunger Vital Sign    Worried About Running Out of Food in the Last Year: Never true    Ran Out of Food in the Last Year: Never true  Transportation Needs: No Transportation Needs (01/21/2024)   PRAPARE - Administrator, Civil Service (Medical): No    Lack of Transportation (Non-Medical): No  Physical Activity: Insufficiently Active (10/08/2021)   Received  from [facility]   Exercise Vital Sign    Days of Exercise per Week: 4 days    Minutes of Exercise per Session: 20 min  Stress: No Stress Concern Present (09/25/2023)   Received from [facility] of Occupational Health - Occupational Stress Questionnaire    Feeling of Stress : Not at all  Recent Concern: Stress - Stress Concern Present (09/23/2023)   Received from [facility] of Occupational Health - Occupational Stress Questionnaire    Feeling of Stress : To some extent  Social Connections: Socially Integrated (01/21/2024)   Social Connection and Isolation Panel [NHANES]    Frequency of Communication with Friends and Family: More than three times a week    Frequency of Social Gatherings with Friends and Family: Once a week    Attends Religious Services: 1 to 4 times per year    Active Member of [employer] or Organizations: No    Attends Banker Meetings:  1 to 4 times per year    Marital Status: Married  Catering manager Violence: Not At Risk (01/21/2024)   Humiliation, Afraid, Rape, and Kick questionnaire    Fear of Current or Ex-Partner: No    Emotionally Abused: No    Physically Abused: No    Sexually Abused: No    Family History  Problem Relation Age of Onset   Breast cancer Mother 38       metastatic   Cancer Maternal Uncle 73 - 4       unknown type   Breast cancer Cousin 66 - 74       maternal first cousin   Breast cancer Cousin 50       maternal first cousin    Allergies as of 03/25/2024 - Review Complete 03/25/2024  Allergen Reaction Noted   Ibuprofen Other (See Comments) 07/03/2016   Levodopa Other (See Comments) 07/03/2016   Oxycodone Other (See Comments) 09/17/2017    Current Outpatient Medications on File Prior to Encounter  Medication Sig Dispense Refill   apixaban (ELIQUIS) 5 MG TABS tablet Take 2 tablets (10mg ) twice daily for 7 days, then 1 tablet (5mg ) twice daily 60 tablet 0    ALPRAZolam (XANAX) 0.25 MG tablet Take 0.25 mg by mouth as needed for sleep or anxiety.     atorvastatin (LIPITOR) 40 MG tablet Take 40 mg by mouth at bedtime.     calcium carbonate (CALCIUM 600) 600 MG TABS tablet Take 300 mg by mouth every other day.     colestipol (COLESTID) 1 g tablet Take 1 g by mouth daily as needed (diarrhea).     cyanocobalamin (VITAMIN B12) 1000 MCG tablet Take 1,000 mcg by mouth in the morning.     dexamethasone (DECADRON) 4 MG tablet Take 2 tabs by mouth 2 times daily starting day before chemo. Then take 2 tabs daily for 2 days starting day after chemo. Take with food. (Patient not taking: Reported on 03/19/2024) 30 tablet 1   famotidine (PEPCID) 20 MG tablet Take 20 mg by mouth 2 (two) times daily.     ferrous sulfate 325 (65 FE) MG tablet Take 325-750 mg by mouth See admin instructions. Take 2 tablet in the morning and 1 tablet at night     fluticasone (FLONASE) 50 MCG/ACT nasal spray Place 1 spray into both nostrils daily as needed for allergies or rhinitis.     furosemide (LASIX) 20 MG tablet Take 1 tablet (20 mg total) by mouth daily as needed for edema. 30 tablet 1   gabapentin (NEURONTIN) 300 MG capsule Take 600 mg by mouth at bedtime.     JARDIANCE 10 MG TABS tablet Take 10 mg by mouth in the morning.     levofloxacin (LEVAQUIN) 250 MG tablet Take 250 mg by mouth daily. (Patient not taking: Reported on 03/19/2024)     lidocaine-prilocaine (EMLA) cream Apply a dime size of cream to port-a-cath 1-2 hours prior to access. Cover with Saran Wrap. 30 g 3   MAGNESIUM GLYCINATE PO Take 300 mg by mouth daily.     Melatonin 10 MG TABS Take 10 mg by mouth at bedtime.     metoprolol succinate (TOPROL-XL) 50 MG 24 hr tablet Take 50 mg by mouth daily.     Multiple Vitamin (MULTIVITAMIN WITH MINERALS) TABS tablet Take 1 tablet by mouth at bedtime.     niacin (SLO-NIACIN) 500 MG tablet Take 500 mg by mouth 3 (three) times a week.     Omega-3  Fatty Acids (FISH OIL) 1000 MG CAPS  Take 1,000 mg by mouth at bedtime.     ondansetron (ZOFRAN) 8 MG tablet Take 1 tablet (8 mg total) by mouth every 8 (eight) hours as needed for nausea or vomiting. Start on the third day after chemotherapy. 30 tablet 1   Polyethyl Glycol-Propyl Glycol (SYSTANE) 0.4-0.3 % SOLN Place 1 drop into both eyes daily as needed (dry/irritated eyes.).     prochlorperazine (COMPAZINE) 10 MG tablet Take 1 tablet (10 mg total) by mouth every 6 (six) hours as needed for nausea or vomiting. 30 tablet 1   SYNTHROID 75 MCG tablet Take 75 mcg by mouth daily before breakfast.     TRULICITY 4.5 MG/0.5ML SOPN Inject 4.5 mg into the skin once a week. Sunday     Vitamin D-Vitamin K (VITAMIN K2-VITAMIN D3 PO) Take 1 tablet by mouth 2 (two) times a week. Tuesdays and Fridays     zolpidem (AMBIEN CR) 12.5 MG CR tablet Take 12.5 mg by mouth at bedtime as needed for sleep.     No current facility-administered medications on file prior to encounter.   REVIEW OF SYSTEMS:  Review of Systems  Respiratory:  Negative for shortness of breath.   Cardiovascular:  Positive for leg swelling. Negative for chest pain and palpitations.  Musculoskeletal:  Negative for myalgias.  Neurological:  Negative for dizziness and tingling.   PHYSICAL EXAMINATION:  Vitals:   03/25/24 1434  BP: 120/71  Pulse: 87  SpO2: 98%    Physical Exam Vitals reviewed.  Cardiovascular:     Rate and Rhythm: Normal rate.  Pulmonary:     Effort: Pulmonary effort is normal.  Musculoskeletal:        General: No tenderness.     Right lower leg: No edema.     Left lower leg: Edema (mild nonpitting edema) present.  Skin:    Findings: No bruising or erythema.  Psychiatric:        Mood and Affect: Mood normal.        Behavior: Behavior normal.        Thought Content: Thought content normal.   Villalta Score for Post-Thrombotic Syndrome: Pain: Absent Cramps: Absent Heaviness: Absent Paresthesia: Absent Pruritus: Absent Pretibial Edema: Mild Skin  Induration: Absent Hyperpigmentation: Absent Redness: Absent Venous Ectasia: Absent Pain on calf compression: Absent Villalta Preliminary Score: 1 Is venous ulcer present?: No If venous ulcer is present and score is <15, then 15 points total are assigned: Absent Villalta Total Score: 1  LABS:  CBC     Component Value Date/Time   WBC 5.3 03/20/2024 0500   RBC 2.59 (L) 03/20/2024 0500   HGB 8.6 (L) 03/20/2024 0500   HGB 10.6 (L) 02/27/2024 1010   HCT 26.3 (L) 03/20/2024 0500   PLT 198 03/20/2024 0500   PLT 228 02/27/2024 1010   MCV 101.5 (H) 03/20/2024 0500   MCH 33.2 03/20/2024 0500   MCHC 32.7 03/20/2024 0500   RDW 19.1 (H) 03/20/2024 0500   LYMPHSABS 1.0 03/19/2024 2014   MONOABS 1.2 (H) 03/19/2024 2014   EOSABS 0.1 03/19/2024 2014   BASOSABS 0.1 03/19/2024 2014    Hepatic Function      Component Value Date/Time   PROT 5.9 (L) 03/19/2024 2014   ALBUMIN 3.4 (L) 03/19/2024 2014   AST 30 03/19/2024 2014   AST 16 02/27/2024 1010   ALT 19 03/19/2024 2014   ALT 19 02/27/2024 1010   ALKPHOS 72 03/19/2024 2014   BILITOT 0.8 03/19/2024  2014   BILITOT 0.5 02/27/2024 1010    Renal Function   Lab Results  Component Value Date   CREATININE 1.48 (H) 03/20/2024   CREATININE 1.64 (H) 03/19/2024   CREATININE 1.50 (H) 02/27/2024    Estimated Creatinine Clearance: 36.7 mL/min (A) (by C-G formula based on SCr of 1.48 mg/dL (H)).   [facility] Vascular Lab Studies:  03/19/24 DVT study FINDINGS: VENOUS   Near occlusive thrombus is present within the left common femoral vein and flow is detected within the greater saphenous vein. Occlusive thrombus is favored within the proximal and mid segments of the femoral vein. Flow is detected within the distal segment of the femoral vein. Near occlusive thrombus is present within the popliteal vein. The distal popliteal vein demonstrates monophasic flow. The peroneal veins are noncompressible. No significant flow is detected within the  posterior tibial veins.   OTHER   The left greater saphenous vein is grossly patent.   Limitations: none   IMPRESSION: 1. An extensive left lower extremity DVT is present which involves the common femoral vein and extends into the femoral, popliteal, and tibial veins.  ASSESSMENT: Location of DVT: Left common femoral vein, Left femoral vein, Left popliteal vein, Left distal vein Cause of DVT: provoked by a persistent risk factor  Patient without prior history of VTE diagnosed with PE and extensive LLE DVT on 03/19/24 in the setting of current breast cancer. PE was incidentally found on CT to rule out any metastases. It was then noted that her left leg was swollen so a DVT study was done. She was admitted to the hospital and evaluated by Dr. Rosalva Comber for possible thrombectomy who had a long discussion with the patient about benefits and risks of mechanical thrombectomy. Since the thrombus in the common femoral vein was nonocclusive and did not extend into the iliac veins and the patient was relatively asymptomatic, it was ultimately decided that she would not have significant benefit with thrombectomy and would be best treated with medical management with anticoagulation, compression, and elevation. She was discharged on Eliquis with plans to repeat ultrasound in 1 week in DVT Clinic to re-evaluate her symptoms.   [place] had her repeat ultrasound this afternoon, which shows some improvement of her DVT. She denies any worsening of symptoms since hospital discharge. Her LLE is only mildly swollen and is limited to below the knee. She is walking independently without pain. As a result of still being virtually asymptomatic, she continues to feel comfortable with Dr. Edger Goody assessment on Saturday to not pursue thrombectomy. She is tolerating Eliquis well. No concerns related to medication access or affordability. Discussed today's ultrasound and Meital's improvement with Dr. Rosalva Comber who agrees to continue  the plan to medically manage with anticoagulation, compression, and elevation, and have her follow up with Dr. Maria Shiner for long-term management of her anticoagulation.  PLAN: -Continue apixaban (Eliquis) 10 mg twice daily for 7 days followed by 5 mg twice daily. -Expected duration of therapy: per Dr. Maria Shiner. Therapy started on 03/19/24. -Patient educated on purpose, proper use and potential adverse effects of apixaban (Eliquis). -Discussed importance of taking medication around the same time every day. -Advised patient of medications to avoid (NSAIDs, aspirin doses >100 mg daily). -Educated that Tylenol (acetaminophen) is the preferred analgesic to lower the risk of bleeding. -Advised patient to alert all providers of anticoagulation therapy prior to starting a new medication or having a procedure. -Emphasized importance of monitoring for signs and symptoms of bleeding (abnormal bruising, prolonged bleeding, nose bleeds,  bleeding from gums, discolored urine, black tarry stools). -Educated patient to present to the ED if emergent signs and symptoms of new thrombosis occur. -Counseled patient to wear compression stockings daily, removing at night. Counseled patient on proper leg elevation.   Follow up: with Dr. Maria Shiner for long-term anticoagulation management. DVT Clinic available as needed.   Faye Hoops, PharmD, [place], CPP Deep Vein Thrombosis Clinic Clinical Pharmacist Practitioner

## 2024-03-25 NOTE — Patient Instructions (Signed)
-  Continue apixaban (Eliquis) 10 mg twice daily for 7 days followed by 5 mg twice daily, which will start tomorrow. -It is important to take your medication around the same time every day.  -Avoid NSAIDs like ibuprofen (Advil, Motrin) and naproxen (Aleve) as well as aspirin doses over 100 mg daily. -Tylenol (acetaminophen) is the preferred over the counter pain medication to lower the risk of bleeding. -Be sure to alert all of your health care providers that you are taking an anticoagulant prior to starting a new medication or having a procedure. -Monitor for signs and symptoms of bleeding (abnormal bruising, prolonged bleeding, nose bleeds, bleeding from gums, discolored urine, black tarry stools). If you have fallen and hit your head OR if your bleeding is severe or not stopping, seek emergency care.  -Go to the emergency room if emergent signs and symptoms of new clot occur (new or worse swelling and pain in an arm or leg, shortness of breath, chest pain, fast or irregular heartbeats, lightheadedness, dizziness, fainting, coughing up blood) or if you experience a significant color change (pale or blue) in the extremity that has the DVT.  -We recommend you wear compression stockings (20-30 mmHg) as long as you are having swelling or pain. Be sure to purchase the correct size and take them off at night.   **As of April 05, 2024, our location is changing** We will be located at [facility] at:  [address], [address], [place], [place] [address] Phone number as of 04/05/24 will be [phone].   If you have any questions or need to reschedule an appointment, please call [phone] [facility] (active number until 04/05/24).  If you are having an emergency, call 911 or present to the nearest emergency room.   What is a DVT?  -Deep vein thrombosis (DVT) is a condition in which a blood clot forms in a vein of the deep venous system which can occur in the lower leg, thigh,  pelvis, arm, or neck. This condition is serious and can be life-threatening if the clot travels to the arteries of the lungs and causing a blockage (pulmonary embolism, PE). A DVT can also damage veins in the leg, which can lead to long-term venous disease, leg pain, swelling, discoloration, and ulcers or sores (post-thrombotic syndrome).  -Treatment may include taking an anticoagulant medication to prevent more clots from forming and the current clot from growing, wearing compression stockings, and/or surgical procedures to remove or dissolve the clot.

## 2024-04-05 ENCOUNTER — Encounter: Payer: Self-pay | Admitting: Hematology & Oncology

## 2024-04-07 ENCOUNTER — Other Ambulatory Visit: Payer: Self-pay

## 2024-04-15 ENCOUNTER — Ambulatory Visit: Admitting: Physician Assistant

## 2024-04-15 ENCOUNTER — Other Ambulatory Visit (INDEPENDENT_AMBULATORY_CARE_PROVIDER_SITE_OTHER): Payer: Self-pay

## 2024-04-15 DIAGNOSIS — M1711 Unilateral primary osteoarthritis, right knee: Secondary | ICD-10-CM | POA: Diagnosis not present

## 2024-04-15 MED ORDER — METHYLPREDNISOLONE ACETATE 40 MG/ML IJ SUSP
40.0000 mg | INTRAMUSCULAR | Status: AC | PRN
  Administered 2024-04-15: 40 mg via INTRA_ARTICULAR

## 2024-04-15 MED ORDER — LIDOCAINE HCL 1 % IJ SOLN
3.0000 mL | INTRAMUSCULAR | Status: AC | PRN
  Administered 2024-04-15: 3 mL

## 2024-04-15 NOTE — Progress Notes (Signed)
 Office Visit Note   Patient: Kristin [name]           Date of Birth: [birth year]           MRN: [id number] Visit Date: 04/15/2024              Requested by: Cherrie Cornwall, NP [address]  [place] [address] PCP: Bert Britain I, NP   Assessment & Plan: Visit Diagnoses:  1. Unilateral primary osteoarthritis, right knee     Plan:  Will try to obtain viscosupplementation injection for the knee as patient is not able to take NSAIDs.  She is not a surgical candidate at this point in time.  She did tolerate cortisone injection today.  Questions were encouraged and answered at length.  She knows to monitor her glucose levels over the next 24 to 48 hours.  She did try a knee brace which made her knee pain worse.  She can continue her Tylenol  for pain.  Follow-Up Instructions: No follow-ups on file.   Orders:  Orders Placed This Encounter  Procedures   XR Knee 1-2 Views Right   No orders of the defined types were placed in this encounter.     Procedures: Large Joint Inj: R knee on 04/15/2024 4:48 PM Indications: pain Details: 22 G 1.5 in needle, anterolateral approach  Arthrogram: No  Medications: 3 mL lidocaine  1 %; 40 mg methylPREDNISolone  acetate 40 MG/ML Outcome: tolerated well, no immediate complications Procedure, treatment alternatives, risks and benefits explained, specific risks discussed. Consent was given by the patient. Immediately prior to procedure a time out was called to verify the correct patient, procedure, equipment, support staff and site/side marked as required. Patient was prepped and draped in the usual sterile fashion.       Clinical Data: No additional findings.   Subjective: Chief Complaint  Patient presents with   Right Knee - Pain    HPI Patient 78 year old female well-known to Dr. Lucienne Ryder with history of a left total knee arthroplasty 09/12/2017.  Left knee is doing well.  Patient is having now right knee  pain.  Began the end of April.  She got out of bed and had intense pain in the leg.  Felt like something slipped.  She has trouble with weightbearing at times.  She has had no injury.  Most of the pain is lateral aspect of the knee.  Pain is worse when driving and switching from the brake to the accelerator.  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  She is also dealing with other medical issues of her husband and her own.  She has type II diabetic but reports her last hemoglobin A1c of 6.82 months ago.  Patient has a history of breast cancer underwent mastectomy is currently undergoing chemotherapy.  Denies any fevers, chills or known ongoing infections.  Review of Systems See HPI  Objective: Vital Signs: There were no vitals taken for this visit.  Physical Exam Constitutional:      Appearance: She is not ill-appearing or diaphoretic.  Pulmonary:     Effort: Pulmonary effort is normal.  Neurological:     Mental Status: She is alert and oriented to person, place, and time.  Psychiatric:        Mood and Affect: Mood normal.     Ortho Exam Bilateral knees: Good range of motion.  Right knee slightly hyperextends.  No instability valgus varus stressing of either knee.  Tendernes right knee only along the lateral joint line.  No abnormal warmth erythema of either knee. Specialty Comments:  No specialty comments available.  Imaging: XR Knee 1-2 Views Right Result Date: 04/15/2024 Right knee 3 views: Shows tricompartmental arthritis that is moderately severe to severe in all 3 compartments.  No acute fractures.  Knee is well located.  No other bony abnormalities.    PMFS History: Patient Active Problem List   Diagnosis Date Noted   DVT (deep venous thrombosis) (HCC) 03/20/2024   Hyperkalemia 03/20/2024   Chronic kidney disease, stage 3b (HCC) 03/20/2024   Pulmonary embolism (HCC) 03/19/2024    Acute pulmonary embolism (HCC) 03/19/2024   CAP (community acquired pneumonia) 01/21/2024   Pneumonia of left lower lobe due to infectious organism 01/20/2024   Breast cancer (HCC) 10/21/2023   S/P left mastectomy 10/21/2023   Genetic testing 10/03/2023   Arthritis of left shoulder region 06/08/2023   Biceps tendonitis on left 06/08/2023   S/P reverse total shoulder arthroplasty, left 05/27/2023   AKI (acute kidney injury) (HCC) 08/25/2022   Diarrhea 08/25/2022   Choledocholithiasis 08/25/2022   Nausea and vomiting 08/25/2022   HTN (hypertension) 08/25/2022   HLD (hyperlipidemia) 08/25/2022   Hypothyroidism 08/25/2022   GERD (gastroesophageal reflux disease) 08/25/2022   OSA (obstructive sleep apnea) 08/25/2022   Hemorrhoid 08/25/2022   Unilateral primary osteoarthritis, right knee 01/14/2022   Anemia 04/26/2020   RBBB 04/26/2020   Type 2 diabetes mellitus (HCC) 04/26/2020   QT prolongation 04/26/2020   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/2017   Closed nondisplaced fracture of head of right radius with routine healing 07/10/2017   Chronic pain of left knee 07/10/2017   Acute pain of left knee 07/10/2017   Unilateral primary osteoarthritis, left knee 07/10/2017   Closed nondisplaced fracture of head of right radius 06/09/2017   Past Medical History:  Diagnosis Date   Anemia 1992   Arthritis    oa   Breast CA (HCC) 1992   lumpectomy with chemo and radiation left breast   Chronic kidney disease stage 3    watching creatining levels sees dr nwbu   Diabetes mellitus without complication (HCC)    type 2    GERD (gastroesophageal reflux disease)    Hiatal hernia    High cholesterol    Hypertension    Hypothyroidism    Personal history of radiation therapy    Presence of permanent cardiac pacemaker    [employer]   Pulmonary embolism (HCC) 03/19/2024   Sleep apnea     Family History  Problem Relation Age of Onset   Breast cancer Mother 74       metastatic    Cancer Maternal Uncle 48 - 42       unknown type   Breast cancer Cousin 93 - 51       maternal first cousin   Breast cancer Cousin 50       maternal first cousin    Past Surgical History:  Procedure Laterality Date   BACK SURGERY     upper and lower upper back has clamp   BICEPT TENODESIS Left 05/27/2023   Procedure: BICEPS TENODESIS;  Surgeon: Jasmine Mesi, MD;  Location: [facility] OR;  Service: Orthopedics;  Laterality: Left;   BREAST LUMPECTOMY Left    and lymph nodes removed (16-18)   CARDIAC CATHETERIZATION  01/29/2022   CATARACT EXTRACTION Bilateral    CHOLECYSTECTOMY  COLONOSCOPY  10/2022   PORTACATH PLACEMENT N/A 12/16/2023   Procedure: PORT PLACEMENT WITH ULTRASOUND GUIDANCE;  Surgeon: Enid Harry, MD;  Location: [facility] OR;  Service: General;  Laterality: N/A;   REVERSE SHOULDER ARTHROPLASTY Left 05/27/2023   Procedure: LEFT REVERSE SHOULDER ARTHROPLASTY;  Surgeon: Jasmine Mesi, MD;  Location: [facility] OR;  Service: Orthopedics;  Laterality: Left;   SIMPLE MASTECTOMY WITH AXILLARY SENTINEL NODE BIOPSY Left 10/21/2023   Procedure: LEFT MASTECTOMY;  Surgeon: Enid Harry, MD;  Location: [facility] OR;  Service: General;  Laterality: Left;  90 MINUTES LMA PEC BLOCK   TONSILLECTOMY     TOTAL KNEE ARTHROPLASTY Left 09/12/2017   Procedure: LEFT TOTAL KNEE ARTHROPLASTY;  Surgeon: Arnie Lao, MD;  Location: [facility] ORS;  Service: Orthopedics;  Laterality: Left;   TUBAL LIGATION     Social History   Occupational History   Not on file  Tobacco Use   Smoking status: Never   Smokeless tobacco: Never  Vaping Use   Vaping status: Never Used  Substance and Sexual Activity   Alcohol use: Not Currently    Comment: maybe 1 glass of wine per year   Drug use: No   Sexual activity: Yes    Birth control/protection: Post-menopausal

## 2024-04-16 ENCOUNTER — Telehealth: Payer: Self-pay | Admitting: Radiology

## 2024-04-16 NOTE — Telephone Encounter (Signed)
 RT KNEE VISCO INJ

## 2024-04-23 ENCOUNTER — Inpatient Hospital Stay

## 2024-04-23 ENCOUNTER — Inpatient Hospital Stay: Attending: Hematology & Oncology

## 2024-04-23 ENCOUNTER — Encounter: Payer: Self-pay | Admitting: Hematology & Oncology

## 2024-04-23 ENCOUNTER — Encounter: Payer: Self-pay | Admitting: *Deleted

## 2024-04-23 ENCOUNTER — Inpatient Hospital Stay (HOSPITAL_BASED_OUTPATIENT_CLINIC_OR_DEPARTMENT_OTHER): Admitting: Hematology & Oncology

## 2024-04-23 ENCOUNTER — Other Ambulatory Visit: Payer: Self-pay | Admitting: *Deleted

## 2024-04-23 VITALS — BP 105/64 | HR 88 | Temp 98.1°F | Resp 20 | Ht 64.0 in | Wt 223.0 lb

## 2024-04-23 DIAGNOSIS — I2699 Other pulmonary embolism without acute cor pulmonale: Secondary | ICD-10-CM

## 2024-04-23 DIAGNOSIS — C50919 Malignant neoplasm of unspecified site of unspecified female breast: Secondary | ICD-10-CM

## 2024-04-23 DIAGNOSIS — Z853 Personal history of malignant neoplasm of breast: Secondary | ICD-10-CM

## 2024-04-23 DIAGNOSIS — C50911 Malignant neoplasm of unspecified site of right female breast: Secondary | ICD-10-CM | POA: Diagnosis present

## 2024-04-23 DIAGNOSIS — Z95828 Presence of other vascular implants and grafts: Secondary | ICD-10-CM

## 2024-04-23 DIAGNOSIS — Z923 Personal history of irradiation: Secondary | ICD-10-CM | POA: Diagnosis not present

## 2024-04-23 DIAGNOSIS — Z79899 Other long term (current) drug therapy: Secondary | ICD-10-CM | POA: Diagnosis not present

## 2024-04-23 DIAGNOSIS — Z7901 Long term (current) use of anticoagulants: Secondary | ICD-10-CM | POA: Insufficient documentation

## 2024-04-23 DIAGNOSIS — I2694 Multiple subsegmental pulmonary emboli without acute cor pulmonale: Secondary | ICD-10-CM

## 2024-04-23 DIAGNOSIS — R911 Solitary pulmonary nodule: Secondary | ICD-10-CM

## 2024-04-23 DIAGNOSIS — Z803 Family history of malignant neoplasm of breast: Secondary | ICD-10-CM

## 2024-04-23 DIAGNOSIS — Z1721 Progesterone receptor positive status: Secondary | ICD-10-CM | POA: Diagnosis not present

## 2024-04-23 DIAGNOSIS — Z1379 Encounter for other screening for genetic and chromosomal anomalies: Secondary | ICD-10-CM

## 2024-04-23 DIAGNOSIS — Z9221 Personal history of antineoplastic chemotherapy: Secondary | ICD-10-CM | POA: Diagnosis not present

## 2024-04-23 DIAGNOSIS — C50011 Malignant neoplasm of nipple and areola, right female breast: Secondary | ICD-10-CM

## 2024-04-23 DIAGNOSIS — Z170 Estrogen receptor positive status [ER+]: Secondary | ICD-10-CM | POA: Insufficient documentation

## 2024-04-23 DIAGNOSIS — Z1732 Human epidermal growth factor receptor 2 negative status: Secondary | ICD-10-CM | POA: Diagnosis not present

## 2024-04-23 DIAGNOSIS — D649 Anemia, unspecified: Secondary | ICD-10-CM

## 2024-04-23 DIAGNOSIS — Z79811 Long term (current) use of aromatase inhibitors: Secondary | ICD-10-CM

## 2024-04-23 LAB — CBC WITH DIFFERENTIAL (CANCER CENTER ONLY)
Abs Immature Granulocytes: 0.02 10*3/uL (ref 0.00–0.07)
Basophils Absolute: 0 10*3/uL (ref 0.0–0.1)
Basophils Relative: 0 %
Eosinophils Absolute: 0.1 10*3/uL (ref 0.0–0.5)
Eosinophils Relative: 2 %
HCT: 36.1 % (ref 36.0–46.0)
Hemoglobin: 11.7 g/dL — ABNORMAL LOW (ref 12.0–15.0)
Immature Granulocytes: 0 %
Lymphocytes Relative: 11 %
Lymphs Abs: 0.6 10*3/uL — ABNORMAL LOW (ref 0.7–4.0)
MCH: 34.8 pg — ABNORMAL HIGH (ref 26.0–34.0)
MCHC: 32.4 g/dL (ref 30.0–36.0)
MCV: 107.4 fL — ABNORMAL HIGH (ref 80.0–100.0)
Monocytes Absolute: 0.4 10*3/uL (ref 0.1–1.0)
Monocytes Relative: 7 %
Neutro Abs: 4.5 10*3/uL (ref 1.7–7.7)
Neutrophils Relative %: 80 %
Platelet Count: 162 10*3/uL (ref 150–400)
RBC: 3.36 MIL/uL — ABNORMAL LOW (ref 3.87–5.11)
RDW: 14.9 % (ref 11.5–15.5)
WBC Count: 5.6 10*3/uL (ref 4.0–10.5)
nRBC: 0 % (ref 0.0–0.2)

## 2024-04-23 LAB — CMP (CANCER CENTER ONLY)
ALT: 27 U/L (ref 0–44)
AST: 26 U/L (ref 15–41)
Albumin: 4.3 g/dL (ref 3.5–5.0)
Alkaline Phosphatase: 102 U/L (ref 38–126)
Anion gap: 9 (ref 5–15)
BUN: 34 mg/dL — ABNORMAL HIGH (ref 8–23)
CO2: 23 mmol/L (ref 22–32)
Calcium: 8.7 mg/dL — ABNORMAL LOW (ref 8.9–10.3)
Chloride: 102 mmol/L (ref 98–111)
Creatinine: 1.56 mg/dL — ABNORMAL HIGH (ref 0.44–1.00)
GFR, Estimated: 34 mL/min — ABNORMAL LOW (ref >60)
Glucose, Bld: 194 mg/dL — ABNORMAL HIGH (ref 70–99)
Potassium: 4.2 mmol/L (ref 3.5–5.1)
Sodium: 134 mmol/L — ABNORMAL LOW (ref 135–145)
Total Bilirubin: 0.7 mg/dL (ref 0.0–1.2)
Total Protein: 6.2 g/dL — ABNORMAL LOW (ref 6.5–8.1)

## 2024-04-23 LAB — FERRITIN: Ferritin: 620 ng/mL — ABNORMAL HIGH (ref 11–307)

## 2024-04-23 LAB — MAGNESIUM: Magnesium: 2 mg/dL (ref 1.7–2.4)

## 2024-04-23 MED ORDER — LETROZOLE 2.5 MG PO TABS: 2.5000 mg | ORAL_TABLET | Freq: Every day | ORAL | 12 refills | Status: AC

## 2024-04-23 MED ORDER — HEPARIN SOD (PORK) LOCK FLUSH 100 UNIT/ML IV SOLN
500.0000 [IU] | Freq: Once | INTRAVENOUS | Status: AC
  Administered 2024-04-23: 500 [IU] via INTRAVENOUS

## 2024-04-23 MED ORDER — SODIUM CHLORIDE 0.9% FLUSH
10.0000 mL | INTRAVENOUS | Status: DC | PRN
  Administered 2024-04-23: 10 mL via INTRAVENOUS

## 2024-04-23 NOTE — Progress Notes (Signed)
 Hematology and Oncology Follow Up Visit  Kristin [name] [id number] 26-Feb-1946 78 y.o. 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  We have new problems.  The last MSR she had a pulmonary embolus and a large clot in the left leg.  She has been on Eliquis .  She admitted her thinking that she may have needed thrombolytic therapy.  Thankfully, she did not need this.  The Eliquis  really is doing a good job.  Her left leg is not nearly as swollen.  We need to get her on Femara .  She needs to be on an aromatase inhibitor.  I think Femara  would be a good idea for her.  I think that she will probably need 7 years of Femara .  We also may need to get a bone density test on her.  I do not see where she has had 1.  She is feeling well.  Her husband is doing much much better.  He also developed thromboembolic disease in his lung.  She does have some fatigue.  She wishes that she would have more energy.  I totally understand this.  She has had no bleeding.  There is been no change in bowel or bladder habits.  I think she does have a little bit of diarrhea.  She has had no mouth sores.  There is been no visual changes.  Overall, I would say that her performance status is probably ECOG 1.      Medications:  Current Outpatient Medications:    ALPRAZolam  (XANAX ) 0.25 MG tablet, Take 0.25 mg by mouth as needed for sleep or anxiety., Disp: , Rfl:    apixaban  (ELIQUIS ) 5 MG TABS tablet, Take 2 tablets (10mg ) twice daily for 7 days, then 1 tablet (5mg ) twice daily, Disp: 60 tablet, Rfl: 0   atorvastatin  (LIPITOR) 40 MG tablet,  Take 40 mg by mouth at bedtime., Disp: , Rfl:    calcium  carbonate (CALCIUM  600) 600 MG TABS tablet, Take 300 mg by mouth every other day., Disp: , Rfl:    colestipol  (COLESTID ) 1 g tablet, Take 1 g by mouth daily as needed (diarrhea)., Disp: , Rfl:    cyanocobalamin  (VITAMIN B12) 1000 MCG tablet, Take 1,000 mcg by mouth in the morning., Disp: , Rfl:    famotidine  (PEPCID ) 20 MG tablet, Take 20 mg by mouth 2 (two) times daily., Disp: , Rfl:    ferrous sulfate  325 (65 FE) MG tablet, Take 325-750 mg by mouth See admin instructions. Take 2 tablet in the morning and 1 tablet at night, Disp: , Rfl:    fluticasone  (FLONASE ) 50 MCG/ACT nasal spray, Place 1 spray into both nostrils daily as needed for allergies or rhinitis., Disp: , Rfl:    furosemide  (LASIX ) 20 MG tablet, Take 1 tablet (20 mg total) by mouth daily as needed for edema., Disp: 30 tablet, Rfl: 1   gabapentin  (NEURONTIN ) 300 MG capsule, Take 600 mg by mouth at bedtime., Disp: , Rfl:    JARDIANCE 10 MG TABS tablet, Take 10 mg by mouth in the morning., Disp: , Rfl:  lidocaine -prilocaine  (EMLA ) cream, Apply a dime size of cream to port-a-cath 1-2 hours prior to access. Cover with [employer]., Disp: 30 g, Rfl: 3   MAGNESIUM  GLYCINATE PO, Take 300 mg by mouth daily., Disp: , Rfl:    Melatonin 10 MG TABS, Take 10 mg by mouth at bedtime., Disp: , Rfl:    metoprolol  succinate (TOPROL -XL) 50 MG 24 hr tablet, Take 50 mg by mouth daily., Disp: , Rfl:    Multiple Vitamin (MULTIVITAMIN WITH MINERALS) TABS tablet, Take 1 tablet by mouth at bedtime., Disp: , Rfl:    niacin (SLO-NIACIN) 500 MG tablet, Take 500 mg by mouth 3 (three) times a week., Disp: , Rfl:    Omega-3 Fatty Acids (FISH OIL) 1000 MG CAPS, Take 1,000 mg by mouth at bedtime., Disp: , Rfl:    ondansetron  (ZOFRAN ) 8 MG tablet, Take 1 tablet (8 mg total) by mouth every 8 (eight) hours as needed for nausea or vomiting. Start on the third day after chemotherapy., Disp: 30 tablet, Rfl: 1    Polyethyl Glycol-Propyl Glycol (SYSTANE) 0.4-0.3 % SOLN, Place 1 drop into both eyes daily as needed (dry/irritated eyes.)., Disp: , Rfl:    SYNTHROID  75 MCG tablet, Take 75 mcg by mouth daily before breakfast., Disp: , Rfl:    TRULICITY 4.5 MG/0.5ML SOPN, Inject 4.5 mg into the skin once a week. Sunday, Disp: , Rfl:    Vitamin D-Vitamin K (VITAMIN K2-VITAMIN D3 PO), Take 1 tablet by mouth 2 (two) times a week. Tuesdays and Fridays, Disp: , Rfl:    zolpidem  (AMBIEN  CR) 12.5 MG CR tablet, Take 12.5 mg by mouth at bedtime as needed for sleep., Disp: , Rfl:   Allergies:  Allergies  Allergen Reactions   Ibuprofen Other (See Comments)    Does not take due to kidney function NSAIDS   Levodopa Other (See Comments)    Increase of dopamine levels   Oxycodone  Other (See Comments)    Pt cannot tolerate oxycodone - makes her "loopy"/ AMS    Past Medical History, Surgical history, Social history, and Family History were reviewed and updated.  Review of Systems: Review of Systems  Constitutional: Negative.   HENT:  Negative.    Eyes: Negative.   Respiratory: Negative.    Cardiovascular: Negative.   Gastrointestinal: Negative.   Endocrine: Negative.   Genitourinary: Negative.    Musculoskeletal: Negative.   Skin: Negative.   Neurological: Negative.   Hematological: Negative.   Psychiatric/Behavioral: Negative.      Physical Exam: Vital signs are temperature of 98.1.  Pulse 88.  Blood pressure 105/64.  Weight is 223 pounds.    Wt Readings from Last 3 Encounters:  04/23/24 223 lb (101.2 kg)  03/20/24 224 lb 3.3 oz (101.7 kg)  02/27/24 225 lb (102.1 kg)    Physical Exam Vitals reviewed.  Constitutional:      Comments: Her breast exam shows the right mastectomy.  This is healing.  She has no erythema or warmth.  There is no skin breakdown.  There is no right axillary adenopathy.  Left breast shows lumpectomy scar at the 12 o'clock position.  There is no mass in the left breast.  There  is no double discharge.  There is no left axillary adenopathy.  HENT:     Head: Normocephalic and atraumatic.  Eyes:     Pupils: Pupils are equal, round, and reactive to light.  Cardiovascular:     Rate and Rhythm: Normal rate and regular rhythm.     Heart  sounds: Normal heart sounds.  Pulmonary:     Effort: Pulmonary effort is normal.     Breath sounds: Normal breath sounds.  Abdominal:     General: Bowel sounds are normal.     Palpations: Abdomen is soft.  Musculoskeletal:        General: No tenderness or deformity. Normal range of motion.     Cervical back: Normal range of motion.  Lymphadenopathy:     Cervical: No cervical adenopathy.  Skin:    General: Skin is warm and dry.     Findings: No erythema or rash.  Neurological:     Mental Status: She is alert and oriented to person, place, and time.  Psychiatric:        Behavior: Behavior normal.        Thought Content: Thought content normal.        Judgment: Judgment normal.      Lab Results  Component Value Date   WBC 5.6 04/23/2024   HGB 11.7 (L) 04/23/2024   HCT 36.1 04/23/2024   MCV 107.4 (H) 04/23/2024   PLT 162 04/23/2024     Chemistry      Component Value Date/Time   NA 134 (L) 04/23/2024 1132   K 4.2 04/23/2024 1132   CL 102 04/23/2024 1132   CO2 23 04/23/2024 1132   BUN 34 (H) 04/23/2024 1132   CREATININE 1.56 (H) 04/23/2024 1132      Component Value Date/Time   CALCIUM  8.7 (L) 04/23/2024 1132   ALKPHOS 102 04/23/2024 1132   AST 26 04/23/2024 1132   ALT 27 04/23/2024 1132   BILITOT 0.7 04/23/2024 1132      Impression and Plan: Kristin [name] is a very nice 78 year old postmenopausal white female.  She has a second breast cancer.  The first breast cancer was about 32 years ago.  She underwent chemotherapy and radiation after her lumpectomy.  She now has a second breast cancer.  She had a mastectomy.  She has a high Oncotype score.  We gave aggressive chemotherapy.  She had 4 cycles of  Taxotere /Cytoxan .  She completed this back in April 2025.  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  She is on Eliquis .  It we will have to recheck her CT angiogram and recheck her Doppler of the legs to see how everything looks.  I suspect that she will probably need to be on Femara  for a good 7 years.  Happy that she is feeling better.  I am also happy that her husband is doing better.  I know that she had a tough time with treatment.  However, she showed a lot of grit and got through the tough days.  I will let have her come back in 6 weeks to see how she is doing.    Ivor Mars, MD 5/16/202512:55 PM

## 2024-04-23 NOTE — Progress Notes (Signed)
 Patient is here for post treatment follow up. Since her last visit she had been found to have PE and DVT. She is now on anticoagulation.   She will now start with Femara . She needs a bone density exam, CT angiogram, and doppler.  Order placed for bone scan.   Oncology Nurse Navigator Documentation     04/23/2024   12:30 PM  Oncology Nurse Navigator Flowsheets  Phase of Treatment AI (Aromatase Inhibitors)  Aromatase Inhibitor Actual Start Date: 04/26/2024  Navigator Follow Up Date: 06/04/2024  Navigator Follow Up Reason: Follow-up Appointment  Navigator Location [facility]  Navigator Encounter Type Follow-up Appt  Patient Visit Type MedOnc  Treatment Phase Active Tx  Barriers/Navigation Needs Coordination of Care;Education  Interventions Coordination of Care  Acuity Level 2-Minimal Needs (1-2 Barriers Identified)  Coordination of Care Other  Support Groups/Services Friends and Family  Time Spent with Patient 15

## 2024-04-23 NOTE — Patient Instructions (Signed)

## 2024-04-24 ENCOUNTER — Other Ambulatory Visit: Payer: Self-pay

## 2024-04-26 LAB — IRON AND IRON BINDING CAPACITY (CC-WL,HP ONLY)
Iron: 72 ug/dL (ref 28–170)
Saturation Ratios: 24 % (ref 10.4–31.8)
TIBC: 301 ug/dL (ref 250–450)
UIBC: 229 ug/dL (ref 148–442)

## 2024-04-30 ENCOUNTER — Encounter: Payer: Self-pay | Admitting: Hematology & Oncology

## 2024-05-04 NOTE — Telephone Encounter (Signed)
VOB has been submitted for Monovisc, right knee.

## 2024-05-21 ENCOUNTER — Ambulatory Visit: Payer: Self-pay | Admitting: Hematology & Oncology

## 2024-05-21 ENCOUNTER — Ambulatory Visit (HOSPITAL_BASED_OUTPATIENT_CLINIC_OR_DEPARTMENT_OTHER)
Admission: RE | Admit: 2024-05-21 | Discharge: 2024-05-21 | Disposition: A | Discharge status: Home / Self Care | Source: Ambulatory Visit | Attending: Hematology & Oncology | Admitting: Hematology & Oncology

## 2024-05-21 ENCOUNTER — Encounter: Payer: Self-pay | Admitting: *Deleted

## 2024-05-21 DIAGNOSIS — C50919 Malignant neoplasm of unspecified site of unspecified female breast: Secondary | ICD-10-CM

## 2024-05-21 DIAGNOSIS — I2699 Other pulmonary embolism without acute cor pulmonale: Secondary | ICD-10-CM

## 2024-05-21 MED ORDER — IOHEXOL 350 MG/ML SOLN: 75.0000 mL | Freq: Once | INTRAVENOUS | Status: AC | PRN

## 2024-05-31 ENCOUNTER — Encounter: Payer: Self-pay | Admitting: Physician Assistant

## 2024-05-31 ENCOUNTER — Ambulatory Visit (INDEPENDENT_AMBULATORY_CARE_PROVIDER_SITE_OTHER): Admitting: Physician Assistant

## 2024-05-31 DIAGNOSIS — M1711 Unilateral primary osteoarthritis, right knee: Secondary | ICD-10-CM | POA: Diagnosis not present

## 2024-05-31 MED ORDER — HYALURONAN 88 MG/4ML IX SOSY
88.0000 mg | PREFILLED_SYRINGE | INTRA_ARTICULAR | Status: AC | PRN
  Administered 2024-05-31: 88 mg via INTRA_ARTICULAR

## 2024-05-31 NOTE — Progress Notes (Signed)
   Procedure Note  Patient: Kristin [name]             Date of Birth: January 04, 1946           MRN: [id number]             Visit Date: 05/31/2024 HPI: Mrs. Boutin comes in today for scheduled right knee Monovisc injection.  Again she is someone who has known osteoarthritis right knee.  She is unable to take NSAIDs.  She has failed other conservative measures which is included cortisone injections and Tylenol .  She has had no new injury to the knee.  She has no scheduled surgery on the right knee in the next 6 months.  Review of systems: See HPI otherwise negative  Physical exam: Right knee full extension full flexion.  No abnormal warmth erythema or effusion Procedures: Visit Diagnoses:  1. Primary osteoarthritis of right knee     Large Joint Inj: R knee on 05/31/2024 11:08 AM Indications: pain Details: 22 G 1.5 in needle, anterolateral approach  Arthrogram: No  Medications: 88 mg Hyaluronan 88 MG/4ML Outcome: tolerated well, no immediate complications Procedure, treatment alternatives, risks and benefits explained, specific risks discussed. Consent was given by the patient. Immediately prior to procedure a time out was called to verify the correct patient, procedure, equipment, support staff and site/side marked as required. Patient was prepped and draped in the usual sterile fashion.      Plan: She will follow-up as needed.  Understands to wait at least 6 months between viscosupplementation injections.

## 2024-06-03 ENCOUNTER — Other Ambulatory Visit: Payer: Self-pay

## 2024-06-03 ENCOUNTER — Other Ambulatory Visit: Payer: Self-pay | Admitting: *Deleted

## 2024-06-03 MED ORDER — APIXABAN 5 MG PO TABS: ORAL_TABLET | ORAL | 0 refills | Status: DC

## 2024-06-03 NOTE — Telephone Encounter (Signed)
 Refill request for Eliquis  received at [facility] for Dr. Timmy patient.  I have pended the refill and routed to him.

## 2024-06-04 ENCOUNTER — Encounter: Payer: Self-pay | Admitting: Hematology & Oncology

## 2024-06-04 ENCOUNTER — Inpatient Hospital Stay: Attending: Hematology & Oncology

## 2024-06-04 ENCOUNTER — Inpatient Hospital Stay (HOSPITAL_BASED_OUTPATIENT_CLINIC_OR_DEPARTMENT_OTHER): Admitting: Hematology & Oncology

## 2024-06-04 ENCOUNTER — Encounter: Payer: Self-pay | Admitting: *Deleted

## 2024-06-04 ENCOUNTER — Inpatient Hospital Stay

## 2024-06-04 VITALS — BP 122/58 | HR 75 | Temp 97.9°F | Resp 18 | Ht 64.0 in | Wt 230.0 lb

## 2024-06-04 DIAGNOSIS — C50911 Malignant neoplasm of unspecified site of right female breast: Secondary | ICD-10-CM | POA: Insufficient documentation

## 2024-06-04 DIAGNOSIS — Z7901 Long term (current) use of anticoagulants: Secondary | ICD-10-CM | POA: Diagnosis not present

## 2024-06-04 DIAGNOSIS — C50919 Malignant neoplasm of unspecified site of unspecified female breast: Secondary | ICD-10-CM

## 2024-06-04 DIAGNOSIS — I2782 Chronic pulmonary embolism: Secondary | ICD-10-CM | POA: Diagnosis not present

## 2024-06-04 DIAGNOSIS — I2699 Other pulmonary embolism without acute cor pulmonale: Secondary | ICD-10-CM

## 2024-06-04 DIAGNOSIS — Z79811 Long term (current) use of aromatase inhibitors: Secondary | ICD-10-CM | POA: Insufficient documentation

## 2024-06-04 DIAGNOSIS — Z9221 Personal history of antineoplastic chemotherapy: Secondary | ICD-10-CM | POA: Diagnosis not present

## 2024-06-04 DIAGNOSIS — N951 Menopausal and female climacteric states: Secondary | ICD-10-CM | POA: Insufficient documentation

## 2024-06-04 DIAGNOSIS — Z923 Personal history of irradiation: Secondary | ICD-10-CM | POA: Insufficient documentation

## 2024-06-04 DIAGNOSIS — Z95828 Presence of other vascular implants and grafts: Secondary | ICD-10-CM

## 2024-06-04 DIAGNOSIS — Z9011 Acquired absence of right breast and nipple: Secondary | ICD-10-CM | POA: Insufficient documentation

## 2024-06-04 DIAGNOSIS — Z86711 Personal history of pulmonary embolism: Secondary | ICD-10-CM | POA: Diagnosis not present

## 2024-06-04 DIAGNOSIS — C50011 Malignant neoplasm of nipple and areola, right female breast: Secondary | ICD-10-CM

## 2024-06-04 DIAGNOSIS — D649 Anemia, unspecified: Secondary | ICD-10-CM

## 2024-06-04 LAB — CBC WITH DIFFERENTIAL (CANCER CENTER ONLY)
Abs Immature Granulocytes: 0.03 10*3/uL (ref 0.00–0.07)
Basophils Absolute: 0 10*3/uL (ref 0.0–0.1)
Basophils Relative: 1 %
Eosinophils Absolute: 0.2 10*3/uL (ref 0.0–0.5)
Eosinophils Relative: 3 %
HCT: 37 % (ref 36.0–46.0)
Hemoglobin: 12.4 g/dL (ref 12.0–15.0)
Immature Granulocytes: 1 %
Lymphocytes Relative: 13 %
Lymphs Abs: 0.8 10*3/uL (ref 0.7–4.0)
MCH: 34.6 pg — ABNORMAL HIGH (ref 26.0–34.0)
MCHC: 33.5 g/dL (ref 30.0–36.0)
MCV: 103.4 fL — ABNORMAL HIGH (ref 80.0–100.0)
Monocytes Absolute: 0.4 10*3/uL (ref 0.1–1.0)
Monocytes Relative: 7 %
Neutro Abs: 4.7 10*3/uL (ref 1.7–7.7)
Neutrophils Relative %: 75 %
Platelet Count: 196 10*3/uL (ref 150–400)
RBC: 3.58 MIL/uL — ABNORMAL LOW (ref 3.87–5.11)
RDW: 12.7 % (ref 11.5–15.5)
WBC Count: 6.1 10*3/uL (ref 4.0–10.5)
nRBC: 0 % (ref 0.0–0.2)

## 2024-06-04 LAB — IRON AND IRON BINDING CAPACITY (CC-WL,HP ONLY)
Iron: 125 ug/dL (ref 28–170)
Saturation Ratios: 38 % — ABNORMAL HIGH (ref 10.4–31.8)
TIBC: 332 ug/dL (ref 250–450)
UIBC: 207 ug/dL (ref 148–442)

## 2024-06-04 LAB — CMP (CANCER CENTER ONLY)
ALT: 14 U/L (ref 0–44)
AST: 14 U/L — ABNORMAL LOW (ref 15–41)
Albumin: 4.3 g/dL (ref 3.5–5.0)
Alkaline Phosphatase: 74 U/L (ref 38–126)
Anion gap: 8 (ref 5–15)
BUN: 46 mg/dL — ABNORMAL HIGH (ref 8–23)
CO2: 23 mmol/L (ref 22–32)
Calcium: 9.6 mg/dL (ref 8.9–10.3)
Chloride: 105 mmol/L (ref 98–111)
Creatinine: 1.64 mg/dL — ABNORMAL HIGH (ref 0.44–1.00)
GFR, Estimated: 32 mL/min — ABNORMAL LOW (ref >60)
Glucose, Bld: 134 mg/dL — ABNORMAL HIGH (ref 70–99)
Potassium: 4.6 mmol/L (ref 3.5–5.1)
Sodium: 136 mmol/L (ref 135–145)
Total Bilirubin: 0.6 mg/dL (ref 0.0–1.2)
Total Protein: 6.2 g/dL — ABNORMAL LOW (ref 6.5–8.1)

## 2024-06-04 LAB — FERRITIN: Ferritin: 356 ng/mL — ABNORMAL HIGH (ref 11–307)

## 2024-06-04 MED ORDER — SODIUM CHLORIDE 0.9% FLUSH
10.0000 mL | INTRAVENOUS | Status: DC | PRN
  Administered 2024-06-04: 10 mL via INTRAVENOUS

## 2024-06-04 MED ORDER — HEPARIN SOD (PORK) LOCK FLUSH 100 UNIT/ML IV SOLN
500.0000 [IU] | Freq: Once | INTRAVENOUS | Status: AC
  Administered 2024-06-04: 500 [IU] via INTRAVENOUS

## 2024-06-04 NOTE — Progress Notes (Signed)
 Hematology and Oncology Follow Up Visit  Kristin [name] [id number] 12-09-1946 78 y.o. 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  She is doing quite nicely.  Thankfully, the pulmonary embolus has resolved by the last CT angiogram that she had done.  We did recently did a Doppler of her left leg.  This showed that the clot was improving.  It is amazed that she really had very little swelling in the left leg.  She continues on the Eliquis .  She is doing well on the Eliquis .  She continues on Femara .  She has had no problems with the Femara .  I think that she does get Prolia from her family doctor.  She has had no bleeding.  There is been no change in bowel or bladder habits.  She has had no cough.  She has had no rashes.  She has had no headache.  Overall, I would have to say that her performance status is ECOG 1.      Medications:  Current Outpatient Medications:    ALPRAZolam  (XANAX ) 0.25 MG tablet, Take 0.25 mg by mouth as needed for sleep or anxiety., Disp: , Rfl:    apixaban  (ELIQUIS ) 5 MG TABS tablet, Take 2 tablets (10mg ) twice daily for 7 days, then 1 tablet (5mg ) twice daily, Disp: 60 tablet, Rfl: 0   atorvastatin  (LIPITOR) 40 MG tablet, Take 40 mg by mouth at bedtime., Disp: , Rfl:    calcium  carbonate (CALCIUM  600) 600 MG TABS tablet, Take 300 mg by mouth every other day., Disp: , Rfl:    colestipol  (COLESTID ) 1 g tablet, Take 1 g by mouth daily as needed (diarrhea)., Disp: , Rfl:    cyanocobalamin  (VITAMIN B12) 1000 MCG tablet, Take 1,000 mcg by mouth in the morning., Disp: , Rfl:     famotidine  (PEPCID ) 20 MG tablet, Take 20 mg by mouth 2 (two) times daily., Disp: , Rfl:    ferrous sulfate  325 (65 FE) MG tablet, Take 325-750 mg by mouth See admin instructions. Take 2 tablet in the morning and 1 tablet at night (Patient taking differently: Take 325 mg by mouth See admin instructions. Take 1 tablet in the morning and 1 tablet at night), Disp: , Rfl:    fluticasone  (FLONASE ) 50 MCG/ACT nasal spray, Place 1 spray into both nostrils daily as needed for allergies or rhinitis., Disp: , Rfl:    furosemide  (LASIX ) 20 MG tablet, Take 1 tablet (20 mg total) by mouth daily as needed for edema., Disp: 30 tablet, Rfl: 1   gabapentin  (NEURONTIN ) 300 MG capsule, Take 600 mg by mouth at bedtime., Disp: , Rfl:    JARDIANCE 10 MG TABS tablet, Take 10 mg by mouth in the morning., Disp: , Rfl:    letrozole  (FEMARA ) 2.5 MG tablet, Take 1 tablet (2.5 mg total) by mouth daily., Disp: 30 tablet, Rfl: 12   lidocaine -prilocaine  (EMLA ) cream, Apply a dime size of cream to port-a-cath 1-2 hours prior to access. Cover with [employer]., Disp: 30 g, Rfl: 3   MAGNESIUM  GLYCINATE PO, Take 300 mg by mouth daily. (Patient  taking differently: Take 400 mg by mouth daily.), Disp: , Rfl:    Melatonin 10 MG TABS, Take 10 mg by mouth at bedtime., Disp: , Rfl:    metoprolol  succinate (TOPROL -XL) 50 MG 24 hr tablet, Take 50 mg by mouth daily., Disp: , Rfl:    Multiple Vitamin (MULTIVITAMIN WITH MINERALS) TABS tablet, Take 1 tablet by mouth at bedtime., Disp: , Rfl:    Omega-3 Fatty Acids (FISH OIL) 1000 MG CAPS, Take 1,000 mg by mouth at bedtime., Disp: , Rfl:    ondansetron  (ZOFRAN ) 8 MG tablet, Take 1 tablet (8 mg total) by mouth every 8 (eight) hours as needed for nausea or vomiting. Start on the third day after chemotherapy., Disp: 30 tablet, Rfl: 1   Polyethyl Glycol-Propyl Glycol (SYSTANE) 0.4-0.3 % SOLN, Place 1 drop into both eyes daily as needed (dry/irritated eyes.)., Disp: , Rfl:    SYNTHROID  75 MCG tablet, Take  75 mcg by mouth daily before breakfast., Disp: , Rfl:    TRULICITY 4.5 MG/0.5ML SOPN, Inject 4.5 mg into the skin once a week. Sunday, Disp: , Rfl:    Vitamin D-Vitamin K (VITAMIN K2-VITAMIN D3 PO), Take 1 tablet by mouth 2 (two) times a week. Tuesdays and Fridays, Disp: , Rfl:    zolpidem  (AMBIEN  CR) 12.5 MG CR tablet, Take 12.5 mg by mouth at bedtime as needed for sleep., Disp: , Rfl:   Allergies:  Allergies  Allergen Reactions   Ibuprofen Other (See Comments)    Does not take due to kidney function NSAIDS   Levodopa Other (See Comments)    Increase of dopamine levels   Oxycodone  Other (See Comments)    Pt cannot tolerate oxycodone - makes her loopy/ AMS    Past Medical History, Surgical history, Social history, and Family History were reviewed and updated.  Review of Systems: Review of Systems  Constitutional: Negative.   HENT:  Negative.    Eyes: Negative.   Respiratory: Negative.    Cardiovascular: Negative.   Gastrointestinal: Negative.   Endocrine: Negative.   Genitourinary: Negative.    Musculoskeletal: Negative.   Skin: Negative.   Neurological: Negative.   Hematological: Negative.   Psychiatric/Behavioral: Negative.      Physical Exam: Vital signs are temperature of 97.9.  Pulse 75.  Blood pressure 122/58.  Weight is 230 pounds.     Wt Readings from Last 3 Encounters:  06/04/24 230 lb (104.3 kg)  04/23/24 223 lb (101.2 kg)  03/20/24 224 lb 3.3 oz (101.7 kg)    Physical Exam Vitals reviewed.  Constitutional:      Comments: Her breast exam shows the right mastectomy.  This is healing.  She has no erythema or warmth.  There is no skin breakdown.  There is no right axillary adenopathy.  Left breast shows lumpectomy scar at the 12 o'clock position.  There is no mass in the left breast.  There is no double discharge.  There is no left axillary adenopathy.  HENT:     Head: Normocephalic and atraumatic.   Eyes:     Pupils: Pupils are equal, round, and reactive  to light.    Cardiovascular:     Rate and Rhythm: Normal rate and regular rhythm.     Heart sounds: Normal heart sounds.  Pulmonary:     Effort: Pulmonary effort is normal.     Breath sounds: Normal breath sounds.  Abdominal:     General: Bowel sounds are normal.     Palpations: Abdomen is soft.  Musculoskeletal:        General: No tenderness or deformity. Normal range of motion.     Cervical back: Normal range of motion.  Lymphadenopathy:     Cervical: No cervical adenopathy.   Skin:    General: Skin is warm and dry.     Findings: No erythema or rash.   Neurological:     Mental Status: She is alert and oriented to person, place, and time.   Psychiatric:        Behavior: Behavior normal.        Thought Content: Thought content normal.        Judgment: Judgment normal.      Lab Results  Component Value Date   WBC 6.1 06/04/2024   HGB 12.4 06/04/2024   HCT 37.0 06/04/2024   MCV 103.4 (H) 06/04/2024   PLT 196 06/04/2024     Chemistry      Component Value Date/Time   NA 136 06/04/2024 0905   K 4.6 06/04/2024 0905   CL 105 06/04/2024 0905   CO2 23 06/04/2024 0905   BUN 46 (H) 06/04/2024 0905   CREATININE 1.64 (H) 06/04/2024 0905      Component Value Date/Time   CALCIUM  9.6 06/04/2024 0905   ALKPHOS 74 06/04/2024 0905   AST 14 (L) 06/04/2024 0905   ALT 14 06/04/2024 0905   BILITOT 0.6 06/04/2024 0905      Impression and Plan: Kristin [name] is a very nice 78 year old postmenopausal white female.  She has a second breast cancer.  The first breast cancer was about 32 years ago.  She underwent chemotherapy and radiation after her lumpectomy.  She now has a second breast cancer.  She had a mastectomy.  She has a high Oncotype score.  We gave aggressive chemotherapy.  She had 4 cycles of Taxotere /Cytoxan .  She completed this back in April 2025.  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  She is on Eliquis .  She is responding by her CT angiogram and by the Doppler  of her left leg.  I forgot to mention that she does have some hot flashes.  I am sure this is probably from the Femara  that she takes.  We will plan to get her back now after Labor Day.  I am happy that things are going well for she and her husband.  [place] know that [place].     Kristin JONELLE Crease, MD 6/27/202510:02 AM

## 2024-06-04 NOTE — Progress Notes (Signed)
 Patient is doing well with no new complaints. Femara  is well tolerated. Her bone density is scheduled for 06/21/2024. She will continue on current treatment plan.   Oncology Nurse Navigator Documentation     06/04/2024    9:15 AM  Oncology Nurse Navigator Flowsheets  Navigator Follow Up Date: 08/20/2024  Navigator Follow Up Reason: Follow-up Appointment  Navigator Location [facility]  Navigator Encounter Type Follow-up Appt  Patient Visit Type MedOnc  Treatment Phase Active Tx  Barriers/Navigation Needs No Barriers At This Time  Interventions Psycho-Social Support  Acuity Level 1-No Barriers  Support Groups/Services Friends and Family  Time Spent with Patient 15

## 2024-06-05 ENCOUNTER — Other Ambulatory Visit: Payer: Self-pay

## 2024-06-05 LAB — CANCER ANTIGEN 27.29: CA 27.29: 35.2 U/mL (ref 0.0–38.6)

## 2024-06-21 ENCOUNTER — Other Ambulatory Visit (HOSPITAL_BASED_OUTPATIENT_CLINIC_OR_DEPARTMENT_OTHER)

## 2024-07-06 ENCOUNTER — Telehealth: Payer: Self-pay

## 2024-07-06 ENCOUNTER — Other Ambulatory Visit: Payer: Self-pay

## 2024-07-06 NOTE — Telephone Encounter (Signed)
 LVM of approval for gel injection for right knee  and to call office to let April J know if she is interested

## 2024-07-12 ENCOUNTER — Encounter: Payer: Self-pay | Admitting: Hematology & Oncology

## 2024-07-13 ENCOUNTER — Other Ambulatory Visit: Payer: Self-pay

## 2024-07-13 ENCOUNTER — Encounter: Payer: Self-pay | Admitting: Hematology & Oncology

## 2024-07-13 MED ORDER — APIXABAN 5 MG PO TABS: ORAL_TABLET | ORAL | 0 refills | Status: DC

## 2024-07-15 ENCOUNTER — Ambulatory Visit (HOSPITAL_BASED_OUTPATIENT_CLINIC_OR_DEPARTMENT_OTHER)
Admission: RE | Admit: 2024-07-15 | Discharge: 2024-07-15 | Disposition: A | Discharge status: Home / Self Care | Source: Ambulatory Visit | Attending: Hematology & Oncology | Admitting: Hematology & Oncology

## 2024-07-15 DIAGNOSIS — Z853 Personal history of malignant neoplasm of breast: Secondary | ICD-10-CM | POA: Diagnosis present

## 2024-07-15 DIAGNOSIS — C50919 Malignant neoplasm of unspecified site of unspecified female breast: Secondary | ICD-10-CM | POA: Diagnosis present

## 2024-07-15 DIAGNOSIS — Z79811 Long term (current) use of aromatase inhibitors: Secondary | ICD-10-CM | POA: Insufficient documentation

## 2024-07-15 DIAGNOSIS — C50011 Malignant neoplasm of nipple and areola, right female breast: Secondary | ICD-10-CM | POA: Diagnosis present

## 2024-07-17 ENCOUNTER — Ambulatory Visit: Payer: Self-pay | Admitting: Hematology & Oncology

## 2024-07-19 ENCOUNTER — Encounter: Payer: Self-pay | Admitting: *Deleted

## 2024-08-17 ENCOUNTER — Other Ambulatory Visit: Payer: Self-pay

## 2024-08-17 ENCOUNTER — Encounter: Payer: Self-pay | Admitting: Hematology & Oncology

## 2024-08-17 MED ORDER — APIXABAN 5 MG PO TABS: ORAL_TABLET | ORAL | 0 refills | Status: DC

## 2024-08-19 ENCOUNTER — Other Ambulatory Visit: Payer: Self-pay | Admitting: General Surgery

## 2024-08-19 DIAGNOSIS — Z0000 Encounter for general adult medical examination without abnormal findings: Secondary | ICD-10-CM

## 2024-08-20 ENCOUNTER — Encounter: Payer: Self-pay | Admitting: *Deleted

## 2024-08-20 ENCOUNTER — Inpatient Hospital Stay: Attending: Hematology & Oncology

## 2024-08-20 ENCOUNTER — Other Ambulatory Visit: Payer: Self-pay

## 2024-08-20 ENCOUNTER — Inpatient Hospital Stay

## 2024-08-20 ENCOUNTER — Inpatient Hospital Stay (HOSPITAL_BASED_OUTPATIENT_CLINIC_OR_DEPARTMENT_OTHER): Admitting: Hematology & Oncology

## 2024-08-20 VITALS — BP 130/72 | HR 71 | Temp 98.1°F | Resp 20 | Ht 64.0 in | Wt 231.1 lb

## 2024-08-20 DIAGNOSIS — Z1721 Progesterone receptor positive status: Secondary | ICD-10-CM | POA: Insufficient documentation

## 2024-08-20 DIAGNOSIS — Z9221 Personal history of antineoplastic chemotherapy: Secondary | ICD-10-CM | POA: Insufficient documentation

## 2024-08-20 DIAGNOSIS — Z7901 Long term (current) use of anticoagulants: Secondary | ICD-10-CM | POA: Diagnosis not present

## 2024-08-20 DIAGNOSIS — I82412 Acute embolism and thrombosis of left femoral vein: Secondary | ICD-10-CM | POA: Insufficient documentation

## 2024-08-20 DIAGNOSIS — Z79811 Long term (current) use of aromatase inhibitors: Secondary | ICD-10-CM | POA: Diagnosis not present

## 2024-08-20 DIAGNOSIS — I2782 Chronic pulmonary embolism: Secondary | ICD-10-CM

## 2024-08-20 DIAGNOSIS — I2694 Multiple subsegmental pulmonary emboli without acute cor pulmonale: Secondary | ICD-10-CM | POA: Diagnosis not present

## 2024-08-20 DIAGNOSIS — Z1732 Human epidermal growth factor receptor 2 negative status: Secondary | ICD-10-CM | POA: Insufficient documentation

## 2024-08-20 DIAGNOSIS — Z923 Personal history of irradiation: Secondary | ICD-10-CM | POA: Diagnosis not present

## 2024-08-20 DIAGNOSIS — I2699 Other pulmonary embolism without acute cor pulmonale: Secondary | ICD-10-CM | POA: Insufficient documentation

## 2024-08-20 DIAGNOSIS — C50919 Malignant neoplasm of unspecified site of unspecified female breast: Secondary | ICD-10-CM

## 2024-08-20 DIAGNOSIS — Z79899 Other long term (current) drug therapy: Secondary | ICD-10-CM | POA: Diagnosis not present

## 2024-08-20 DIAGNOSIS — C50912 Malignant neoplasm of unspecified site of left female breast: Secondary | ICD-10-CM | POA: Diagnosis not present

## 2024-08-20 DIAGNOSIS — Z170 Estrogen receptor positive status [ER+]: Secondary | ICD-10-CM | POA: Insufficient documentation

## 2024-08-20 DIAGNOSIS — C50911 Malignant neoplasm of unspecified site of right female breast: Secondary | ICD-10-CM | POA: Diagnosis present

## 2024-08-20 LAB — CMP (CANCER CENTER ONLY)
ALT: 19 U/L (ref 0–44)
AST: 19 U/L (ref 15–41)
Albumin: 3.9 g/dL (ref 3.5–5.0)
Alkaline Phosphatase: 73 U/L (ref 38–126)
Anion gap: 11 (ref 5–15)
BUN: 37 mg/dL — ABNORMAL HIGH (ref 8–23)
CO2: 21 mmol/L — ABNORMAL LOW (ref 22–32)
Calcium: 9.2 mg/dL (ref 8.9–10.3)
Chloride: 107 mmol/L (ref 98–111)
Creatinine: 1.58 mg/dL — ABNORMAL HIGH (ref 0.44–1.00)
GFR, Estimated: 33 mL/min — ABNORMAL LOW (ref >60)
Glucose, Bld: 113 mg/dL — ABNORMAL HIGH (ref 70–99)
Potassium: 4.5 mmol/L (ref 3.5–5.1)
Sodium: 138 mmol/L (ref 135–145)
Total Bilirubin: 0.7 mg/dL (ref 0.0–1.2)
Total Protein: 6.1 g/dL — ABNORMAL LOW (ref 6.5–8.1)

## 2024-08-20 LAB — CBC WITH DIFFERENTIAL (CANCER CENTER ONLY)
Abs Immature Granulocytes: 0.02 K/uL (ref 0.00–0.07)
Basophils Absolute: 0 K/uL (ref 0.0–0.1)
Basophils Relative: 0 %
Eosinophils Absolute: 0.2 K/uL (ref 0.0–0.5)
Eosinophils Relative: 3 %
HCT: 34.1 % — ABNORMAL LOW (ref 36.0–46.0)
Hemoglobin: 11.5 g/dL — ABNORMAL LOW (ref 12.0–15.0)
Immature Granulocytes: 0 %
Lymphocytes Relative: 12 %
Lymphs Abs: 0.7 K/uL (ref 0.7–4.0)
MCH: 34.3 pg — ABNORMAL HIGH (ref 26.0–34.0)
MCHC: 33.7 g/dL (ref 30.0–36.0)
MCV: 101.8 fL — ABNORMAL HIGH (ref 80.0–100.0)
Monocytes Absolute: 0.6 K/uL (ref 0.1–1.0)
Monocytes Relative: 10 %
Neutro Abs: 4.5 K/uL (ref 1.7–7.7)
Neutrophils Relative %: 75 %
Platelet Count: 178 K/uL (ref 150–400)
RBC: 3.35 MIL/uL — ABNORMAL LOW (ref 3.87–5.11)
RDW: 13.6 % (ref 11.5–15.5)
WBC Count: 6 K/uL (ref 4.0–10.5)
nRBC: 0 % (ref 0.0–0.2)

## 2024-08-20 LAB — LACTATE DEHYDROGENASE: LDH: 143 U/L (ref 98–192)

## 2024-08-20 NOTE — Progress Notes (Signed)
 Patient is doing well with no navigational needs for some time. Will discontinue active navigation at this time but be available as needed to patient in the future.   Oncology Nurse Navigator Documentation     08/20/2024    9:15 AM  Oncology Nurse Navigator Flowsheets  Navigation Complete Date: 08/20/2024  Post Navigation: Continue to Follow Patient? No  Reason Not Navigating Patient: Patient On Maintenance Chemotherapy  Navigator Location [facility]  Navigator Encounter Type Follow-up Appt  Patient Visit Type MedOnc  Treatment Phase Active Tx  Barriers/Navigation Needs No Barriers At This Time  Interventions None Required  Acuity Level 1-No Barriers  Support Groups/Services Friends and Family  Time Spent with Patient 15

## 2024-08-20 NOTE — Progress Notes (Signed)
 Hematology and Oncology Follow Up Visit  Kristin [name] [id number] [birth year] 78 y.o. 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  Kristin [name] is doing quite nicely.  So far, things have been going pretty well with Kristin [name].  We last saw Kristin [name] back in June.  Kristin [name] had no problems over the Summer.  Kristin [name] continues on Eliquis .  We will check a Doppler of Kristin [name] left leg in November to see how the thrombus is doing.  Kristin [name] has had no bleeding.  Kristin [name] has had no nausea or vomiting.  Kristin [name] hair is starting to come back.  Kristin [name] is happy about this.  Kristin [name] continues on the Femara .  Kristin [name] is a little worried about the weight gain from the Femara .  Kristin [name] has had no fever.  There is been no problems with COVID.  Kristin [name] does have limited swelling in the legs.  I told Kristin [name] that Kristin [name] could hold off on wearing the compression socks if Kristin [name] wanted to.  Kristin [name] has had no issues with the mastectomy that Kristin [name] had for the left breast cancer.  Kristin [name] has had no cough.  There is been no chest wall pain.  Currently, I would say that Kristin [name] performance status is probably ECOG 1.     Medications:  Current Outpatient Medications:    ALPRAZolam  (XANAX ) 0.25 MG tablet, Take 0.25 mg by mouth as needed for sleep or anxiety., Disp: , Rfl:    apixaban  (ELIQUIS ) 5 MG TABS tablet, Take 1 tablet (5mg ) twice daily, Disp: 180 tablet, Rfl: 0   atorvastatin  (LIPITOR) 40 MG tablet, Take 40 mg by mouth at bedtime., Disp: , Rfl:    calcium  carbonate (CALCIUM  600) 600 MG TABS tablet, Take 300 mg by mouth every other day., Disp: , Rfl:    colestipol   (COLESTID ) 1 g tablet, Take 1 g by mouth daily as needed (diarrhea)., Disp: , Rfl:    cyanocobalamin  (VITAMIN B12) 1000 MCG tablet, Take 1,000 mcg by mouth in the morning., Disp: , Rfl:    famotidine  (PEPCID ) 20 MG tablet, Take 20 mg by mouth 2 (two) times daily., Disp: , Rfl:    ferrous sulfate  325 (65 FE) MG tablet, Take 325-750 mg by mouth See admin instructions. Take 2 tablet in the morning and 1 tablet at night (Patient taking differently: Take 325 mg by mouth See admin instructions. Take 1 tablet in the morning and 1 tablet at night), Disp: , Rfl:    fluticasone  (FLONASE ) 50 MCG/ACT nasal spray, Place 1 spray into both nostrils daily as needed for allergies or rhinitis., Disp: , Rfl:    furosemide  (LASIX ) 20 MG tablet, Take 1 tablet (20 mg total) by mouth daily as needed for edema., Disp: 30 tablet, Rfl: 1   gabapentin  (NEURONTIN ) 300 MG capsule, Take 600 mg by mouth at bedtime., Disp: , Rfl:    JARDIANCE 10 MG TABS tablet, Take 10 mg by mouth in the morning., Disp: , Rfl:    letrozole  (FEMARA ) 2.5 MG tablet, Take 1 tablet (2.5 mg total) by mouth daily., Disp: 30 tablet,  Rfl: 12   lidocaine -prilocaine  (EMLA ) cream, Apply a dime size of cream to port-a-cath 1-2 hours prior to access. Cover with [employer]., Disp: 30 g, Rfl: 3   MAGNESIUM  GLYCINATE PO, Take 300 mg by mouth daily. (Patient taking differently: Take 400 mg by mouth daily.), Disp: , Rfl:    Melatonin 10 MG TABS, Take 10 mg by mouth at bedtime., Disp: , Rfl:    metoprolol  succinate (TOPROL -XL) 50 MG 24 hr tablet, Take 50 mg by mouth daily., Disp: , Rfl:    Multiple Vitamin (MULTIVITAMIN WITH MINERALS) TABS tablet, Take 1 tablet by mouth at bedtime., Disp: , Rfl:    Omega-3 Fatty Acids (FISH OIL) 1000 MG CAPS, Take 1,000 mg by mouth at bedtime., Disp: , Rfl:    ondansetron  (ZOFRAN ) 8 MG tablet, Take 1 tablet (8 mg total) by mouth every 8 (eight) hours as needed for nausea or vomiting. Start on the third day after chemotherapy., Disp: 30  tablet, Rfl: 1   Polyethyl Glycol-Propyl Glycol (SYSTANE) 0.4-0.3 % SOLN, Place 1 drop into both eyes daily as needed (dry/irritated eyes.)., Disp: , Rfl:    SYNTHROID  75 MCG tablet, Take 75 mcg by mouth daily before breakfast., Disp: , Rfl:    TRULICITY 4.5 MG/0.5ML SOPN, Inject 4.5 mg into the skin once a week. Sunday, Disp: , Rfl:    Vitamin D-Vitamin K (VITAMIN K2-VITAMIN D3 PO), Take 1 tablet by mouth 2 (two) times a week. Tuesdays and Fridays, Disp: , Rfl:    zolpidem  (AMBIEN  CR) 12.5 MG CR tablet, Take 12.5 mg by mouth at bedtime as needed for sleep., Disp: , Rfl:   Allergies:  Allergies  Allergen Reactions   Ibuprofen Other (See Comments)    Does not take due to kidney function NSAIDS   Levodopa Other (See Comments)    Increase of dopamine levels   Oxycodone  Other (See Comments)    Pt cannot tolerate oxycodone - makes Kristin [name] loopy/ AMS    Past Medical History, Surgical history, Social history, and Family History were reviewed and updated.  Review of Systems: Review of Systems  Constitutional: Negative.   HENT:  Negative.    Eyes: Negative.   Respiratory: Negative.    Cardiovascular: Negative.   Gastrointestinal: Negative.   Endocrine: Negative.   Genitourinary: Negative.    Musculoskeletal: Negative.   Skin: Negative.   Neurological: Negative.   Hematological: Negative.   Psychiatric/Behavioral: Negative.      Physical Exam: Vital signs are temperature of 98.1.  Pulse 71.  Blood pressure 130/72.  Weight is 231 pounds.     Wt Readings from Last 3 Encounters:  08/20/24 231 lb 1.9 oz (104.8 kg)  06/04/24 230 lb (104.3 kg)  04/23/24 223 lb (101.2 kg)    Physical Exam Vitals reviewed.  Constitutional:      Comments: Kristin [name] breast exam shows the right mastectomy.  This is healing.  Kristin [name] has no erythema or warmth.  There is no skin breakdown.  There is no right axillary adenopathy.  Left breast shows lumpectomy scar at the 12 o'clock position.  There is no mass in the  left breast.  There is no double discharge.  There is no left axillary adenopathy.  HENT:     Head: Normocephalic and atraumatic.  Eyes:     Pupils: Pupils are equal, round, and reactive to light.  Cardiovascular:     Rate and Rhythm: Normal rate and regular rhythm.     Heart sounds: Normal heart sounds.  Pulmonary:  Effort: Pulmonary effort is normal.     Breath sounds: Normal breath sounds.  Abdominal:     General: Bowel sounds are normal.     Palpations: Abdomen is soft.  Musculoskeletal:        General: No tenderness or deformity. Normal range of motion.     Cervical back: Normal range of motion.  Lymphadenopathy:     Cervical: No cervical adenopathy.  Skin:    General: Skin is warm and dry.     Findings: No erythema or rash.  Neurological:     Mental Status: Kristin [name] is alert and oriented to person, place, and time.  Psychiatric:        Behavior: Behavior normal.        Thought Content: Thought content normal.        Judgment: Judgment normal.      Lab Results  Component Value Date   WBC 6.0 08/20/2024   HGB 11.5 (L) 08/20/2024   HCT 34.1 (L) 08/20/2024   MCV 101.8 (H) 08/20/2024   PLT 178 08/20/2024     Chemistry      Component Value Date/Time   NA 138 08/20/2024 0908   K 4.5 08/20/2024 0908   CL 107 08/20/2024 0908   CO2 21 (L) 08/20/2024 0908   BUN 37 (H) 08/20/2024 0908   CREATININE 1.58 (H) 08/20/2024 0908      Component Value Date/Time   CALCIUM  9.2 08/20/2024 0908   ALKPHOS 73 08/20/2024 0908   AST 19 08/20/2024 0908   ALT 19 08/20/2024 0908   BILITOT 0.7 08/20/2024 0908      Impression and Plan: Ms. Meckler is a very nice 78 year old postmenopausal white female.  Kristin [name] has a second breast cancer.  The first breast cancer was about 32 years ago.  Kristin [name] underwent chemotherapy and radiation after Kristin [name] lumpectomy.  Kristin [name] now has a second breast cancer.  Kristin [name] had a mastectomy.  Kristin [name] has a high Oncotype score.  We gave aggressive chemotherapy.  Kristin [name] had 4  cycles of Taxotere /Cytoxan .  Kristin [name] completed this back in April 2025.  Kristin [name] did develop thromboembolic disease.  Kristin [name] is on Eliquis .  Kristin [name] is responding by Kristin [name] CT angiogram and by the Doppler of Kristin [name] left leg.  Again, I will do another Doppler of Kristin [name] left leg in November..  I told Kristin [name] that probably would have Kristin [name] blood thinner for good 2 years.  I think this would be very reasonable.  Kristin [name] may need to be on long-term maintenance.  I will plan to get Kristin [name] back in 3 months.  Maude JONELLE Crease, MD 9/12/202510:16 AM

## 2024-08-21 LAB — CANCER ANTIGEN 27.29: CA 27.29: 36.5 U/mL (ref 0.0–38.6)

## 2024-08-22 ENCOUNTER — Other Ambulatory Visit: Payer: Self-pay

## 2024-08-31 ENCOUNTER — Other Ambulatory Visit: Payer: Self-pay | Admitting: General Surgery

## 2024-08-31 ENCOUNTER — Encounter: Payer: Self-pay | Admitting: Hematology & Oncology

## 2024-08-31 ENCOUNTER — Ambulatory Visit
Admission: RE | Admit: 2024-08-31 | Discharge: 2024-08-31 | Disposition: A | Discharge status: Home / Self Care | Source: Ambulatory Visit | Attending: General Surgery | Admitting: General Surgery

## 2024-08-31 DIAGNOSIS — Z0000 Encounter for general adult medical examination without abnormal findings: Secondary | ICD-10-CM

## 2024-08-31 DIAGNOSIS — Z1231 Encounter for screening mammogram for malignant neoplasm of breast: Secondary | ICD-10-CM

## 2024-09-03 ENCOUNTER — Other Ambulatory Visit: Payer: Self-pay | Admitting: General Surgery

## 2024-09-03 DIAGNOSIS — R928 Other abnormal and inconclusive findings on diagnostic imaging of breast: Secondary | ICD-10-CM

## 2024-09-08 ENCOUNTER — Encounter

## 2024-09-20 ENCOUNTER — Other Ambulatory Visit: Payer: Self-pay

## 2024-09-22 ENCOUNTER — Other Ambulatory Visit: Payer: Self-pay | Admitting: General Surgery

## 2024-09-22 ENCOUNTER — Ambulatory Visit
Admission: RE | Admit: 2024-09-22 | Discharge: 2024-09-22 | Disposition: A | Discharge status: Home / Self Care | Source: Ambulatory Visit | Attending: General Surgery | Admitting: General Surgery

## 2024-09-22 DIAGNOSIS — R928 Other abnormal and inconclusive findings on diagnostic imaging of breast: Secondary | ICD-10-CM

## 2024-09-22 DIAGNOSIS — R921 Mammographic calcification found on diagnostic imaging of breast: Secondary | ICD-10-CM

## 2024-09-22 HISTORY — DX: Personal history of antineoplastic chemotherapy: Z92.21

## 2024-09-22 HISTORY — DX: Malignant neoplasm of unspecified site of unspecified female breast: C50.919

## 2024-09-23 ENCOUNTER — Ambulatory Visit
Admission: RE | Admit: 2024-09-23 | Discharge: 2024-09-23 | Disposition: A | Discharge status: Home / Self Care | Source: Ambulatory Visit | Attending: General Surgery | Admitting: General Surgery

## 2024-09-23 DIAGNOSIS — R921 Mammographic calcification found on diagnostic imaging of breast: Secondary | ICD-10-CM

## 2024-09-23 HISTORY — PX: BREAST BIOPSY: SHX20

## 2024-09-24 LAB — SURGICAL PATHOLOGY

## 2024-09-29 ENCOUNTER — Encounter

## 2024-09-30 ENCOUNTER — Inpatient Hospital Stay

## 2024-10-11 ENCOUNTER — Inpatient Hospital Stay: Attending: Hematology & Oncology

## 2024-10-11 ENCOUNTER — Encounter: Payer: Self-pay | Admitting: Radiology

## 2024-10-11 DIAGNOSIS — Z1721 Progesterone receptor positive status: Secondary | ICD-10-CM | POA: Diagnosis not present

## 2024-10-11 DIAGNOSIS — Z170 Estrogen receptor positive status [ER+]: Secondary | ICD-10-CM | POA: Diagnosis not present

## 2024-10-11 DIAGNOSIS — Z1732 Human epidermal growth factor receptor 2 negative status: Secondary | ICD-10-CM | POA: Insufficient documentation

## 2024-10-11 DIAGNOSIS — Z452 Encounter for adjustment and management of vascular access device: Secondary | ICD-10-CM | POA: Insufficient documentation

## 2024-10-11 DIAGNOSIS — C50911 Malignant neoplasm of unspecified site of right female breast: Secondary | ICD-10-CM | POA: Insufficient documentation

## 2024-10-26 ENCOUNTER — Ambulatory Visit: Payer: Self-pay | Admitting: Hematology & Oncology

## 2024-10-26 ENCOUNTER — Ambulatory Visit (HOSPITAL_BASED_OUTPATIENT_CLINIC_OR_DEPARTMENT_OTHER)
Admission: RE | Admit: 2024-10-26 | Discharge: 2024-10-26 | Disposition: A | Discharge status: Home / Self Care | Source: Ambulatory Visit | Attending: Hematology & Oncology | Admitting: Hematology & Oncology

## 2024-10-26 DIAGNOSIS — I2694 Multiple subsegmental pulmonary emboli without acute cor pulmonale: Secondary | ICD-10-CM | POA: Insufficient documentation

## 2024-10-28 ENCOUNTER — Other Ambulatory Visit: Payer: Self-pay | Admitting: Hematology & Oncology

## 2024-11-06 ENCOUNTER — Other Ambulatory Visit: Payer: Self-pay

## 2024-11-19 ENCOUNTER — Inpatient Hospital Stay: Admitting: Hematology & Oncology

## 2024-11-19 ENCOUNTER — Encounter: Payer: Self-pay | Admitting: Hematology & Oncology

## 2024-11-19 ENCOUNTER — Inpatient Hospital Stay

## 2024-11-19 ENCOUNTER — Other Ambulatory Visit: Payer: Self-pay

## 2024-11-19 ENCOUNTER — Inpatient Hospital Stay: Attending: Hematology & Oncology

## 2024-11-19 DIAGNOSIS — Z79899 Other long term (current) drug therapy: Secondary | ICD-10-CM | POA: Diagnosis not present

## 2024-11-19 DIAGNOSIS — I2694 Multiple subsegmental pulmonary emboli without acute cor pulmonale: Secondary | ICD-10-CM

## 2024-11-19 DIAGNOSIS — I82532 Chronic embolism and thrombosis of left popliteal vein: Secondary | ICD-10-CM | POA: Diagnosis not present

## 2024-11-19 DIAGNOSIS — Z170 Estrogen receptor positive status [ER+]: Secondary | ICD-10-CM | POA: Diagnosis not present

## 2024-11-19 DIAGNOSIS — I82512 Chronic embolism and thrombosis of left femoral vein: Secondary | ICD-10-CM | POA: Insufficient documentation

## 2024-11-19 DIAGNOSIS — Z9221 Personal history of antineoplastic chemotherapy: Secondary | ICD-10-CM | POA: Diagnosis not present

## 2024-11-19 DIAGNOSIS — C50919 Malignant neoplasm of unspecified site of unspecified female breast: Secondary | ICD-10-CM

## 2024-11-19 DIAGNOSIS — Z1732 Human epidermal growth factor receptor 2 negative status: Secondary | ICD-10-CM | POA: Diagnosis not present

## 2024-11-19 DIAGNOSIS — Z79811 Long term (current) use of aromatase inhibitors: Secondary | ICD-10-CM | POA: Diagnosis not present

## 2024-11-19 DIAGNOSIS — C50911 Malignant neoplasm of unspecified site of right female breast: Secondary | ICD-10-CM | POA: Diagnosis present

## 2024-11-19 DIAGNOSIS — Z923 Personal history of irradiation: Secondary | ICD-10-CM | POA: Insufficient documentation

## 2024-11-19 DIAGNOSIS — Z950 Presence of cardiac pacemaker: Secondary | ICD-10-CM | POA: Diagnosis not present

## 2024-11-19 DIAGNOSIS — Z1721 Progesterone receptor positive status: Secondary | ICD-10-CM | POA: Insufficient documentation

## 2024-11-19 LAB — CMP (CANCER CENTER ONLY)
ALT: 16 U/L (ref 0–44)
AST: 21 U/L (ref 15–41)
Albumin: 4.2 g/dL (ref 3.5–5.0)
Alkaline Phosphatase: 90 U/L (ref 38–126)
Anion gap: 12 (ref 5–15)
BUN: 35 mg/dL — ABNORMAL HIGH (ref 8–23)
CO2: 24 mmol/L (ref 22–32)
Calcium: 10 mg/dL (ref 8.9–10.3)
Chloride: 103 mmol/L (ref 98–111)
Creatinine: 1.69 mg/dL — ABNORMAL HIGH (ref 0.44–1.00)
GFR, Estimated: 30 mL/min — ABNORMAL LOW (ref >60)
Glucose, Bld: 140 mg/dL — ABNORMAL HIGH (ref 70–99)
Potassium: 4.1 mmol/L (ref 3.5–5.1)
Sodium: 139 mmol/L (ref 135–145)
Total Bilirubin: 0.7 mg/dL (ref 0.0–1.2)
Total Protein: 6.4 g/dL — ABNORMAL LOW (ref 6.5–8.1)

## 2024-11-19 LAB — CBC WITH DIFFERENTIAL (CANCER CENTER ONLY)
Abs Immature Granulocytes: 0.02 K/uL (ref 0.00–0.07)
Basophils Absolute: 0 K/uL (ref 0.0–0.1)
Basophils Relative: 1 %
Eosinophils Absolute: 0.2 K/uL (ref 0.0–0.5)
Eosinophils Relative: 3 %
HCT: 39.2 % (ref 36.0–46.0)
Hemoglobin: 13 g/dL (ref 12.0–15.0)
Immature Granulocytes: 0 %
Lymphocytes Relative: 19 %
Lymphs Abs: 1.3 K/uL (ref 0.7–4.0)
MCH: 33.8 pg (ref 26.0–34.0)
MCHC: 33.2 g/dL (ref 30.0–36.0)
MCV: 101.8 fL — ABNORMAL HIGH (ref 80.0–100.0)
Monocytes Absolute: 0.5 K/uL (ref 0.1–1.0)
Monocytes Relative: 8 %
Neutro Abs: 4.6 K/uL (ref 1.7–7.7)
Neutrophils Relative %: 69 %
Platelet Count: 194 K/uL (ref 150–400)
RBC: 3.85 MIL/uL — ABNORMAL LOW (ref 3.87–5.11)
RDW: 13.3 % (ref 11.5–15.5)
WBC Count: 6.6 K/uL (ref 4.0–10.5)
nRBC: 0 % (ref 0.0–0.2)

## 2024-11-19 NOTE — Progress Notes (Signed)
 [facility] On[facility] Follow Up Visit  Kristin [name] [id number] 1946/08/08 78 y.o. 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  She is doing quite nicely.  However, her report her husband is having his issues with the Sweet Syndrome.  We are trying  to get him potassium iodide but no one seems to have this anywhere.   She is doing well on the Eliquis .  She has had no problems with the Eliquis .  Has been no bleeding.  She has a pacemaker.  She goes back to the cardiologist I think in a week.  She had Doppler of her left leg I think in November.  There is no residual thrombus in the profunda femoral vein.  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  Also was noted was nonocclusive thrombus in the popliteal vein.  Everything may have looked a little bit better.  She has had really no swelling in the left leg.  There is no pain in the left leg..  She is having problems with her right knee.  She probably needs to have surgery for this but is somewhat reluctant to have this given that she is on blood thinner.  She has had no fever.  She has had no cough.  She has had no chest wall pain.  Overall, I will say that her performance status is probably ECOG 1.  Medications:  Current Outpatient Medications:    ALPRAZolam  (XANAX ) 0.25 MG tablet, Take 0.25 mg by mouth as needed for sleep or anxiety., Disp: , Rfl:    atorvastatin  (LIPITOR) 40 MG tablet, Take 40 mg by mouth at bedtime., Disp: , Rfl:    calcium  carbonate (CALCIUM  600) 600 MG TABS  tablet, Take 300 mg by mouth every other day., Disp: , Rfl:    colestipol  (COLESTID ) 1 g tablet, Take 1 g by mouth daily as needed (diarrhea)., Disp: , Rfl:    cyanocobalamin  (VITAMIN B12) 1000 MCG tablet, Take 1,000 mcg by mouth in the morning., Disp: , Rfl:    ELIQUIS  5 MG TABS tablet, TAKE 1 TABLET TWICE A DAY, Disp: 180 tablet, Rfl: 3   famotidine  (PEPCID ) 20 MG tablet, Take 20 mg by mouth 2 (two) times daily., Disp: , Rfl:    ferrous sulfate  325 (65 FE) MG tablet, Take 325-750 mg by mouth See admin instructions. Take 2 tablet in the morning and 1 tablet at night (Patient taking differently: Take 325 mg by mouth See admin instructions. Take 1 tablet in the morning and 1 tablet at night), Disp: , Rfl:    fluticasone  (FLONASE ) 50 MCG/ACT nasal spray, Place 1 spray into both nostrils daily as needed for allergies or rhinitis., Disp: , Rfl:    furosemide  (LASIX ) 20 MG tablet, Take 1 tablet (20 mg total) by mouth daily as needed for edema., Disp: 30 tablet, Rfl: 1   gabapentin  (NEURONTIN ) 300 MG capsule, Take 600 mg by mouth at bedtime., Disp: , Rfl:    JARDIANCE 10 MG TABS tablet, Take 10 mg by mouth in  the morning., Disp: , Rfl:    letrozole  (FEMARA ) 2.5 MG tablet, Take 1 tablet (2.5 mg total) by mouth daily., Disp: 30 tablet, Rfl: 12   lidocaine -prilocaine  (EMLA ) cream, Apply a dime size of cream to port-a-cath 1-2 hours prior to access. Cover with [employer]., Disp: 30 g, Rfl: 3   MAGNESIUM  GLYCINATE PO, Take 300 mg by mouth daily. (Patient taking differently: Take 400 mg by mouth daily.), Disp: , Rfl:    Melatonin 10 MG TABS, Take 10 mg by mouth at bedtime., Disp: , Rfl:    metoprolol  succinate (TOPROL -XL) 50 MG 24 hr tablet, Take 50 mg by mouth daily., Disp: , Rfl:    Multiple Vitamin (MULTIVITAMIN WITH MINERALS) TABS tablet, Take 1 tablet by mouth at bedtime., Disp: , Rfl:    Omega-3 Fatty Acids (FISH OIL) 1000 MG CAPS, Take 1,000 mg by mouth at bedtime., Disp: , Rfl:    Polyethyl  Glycol-Propyl Glycol (SYSTANE) 0.4-0.3 % SOLN, Place 1 drop into both eyes daily as needed (dry/irritated eyes.)., Disp: , Rfl:    SYNTHROID  75 MCG tablet, Take 75 mcg by mouth daily before breakfast., Disp: , Rfl:    TRULICITY 4.5 MG/0.5ML SOPN, Inject 4.5 mg into the skin once a week. Sunday, Disp: , Rfl:    Vitamin D-Vitamin K (VITAMIN K2-VITAMIN D3 PO), Take 1 tablet by mouth 2 (two) times a week. Tuesdays and Fridays, Disp: , Rfl:    zolpidem  (AMBIEN  CR) 12.5 MG CR tablet, Take 12.5 mg by mouth at bedtime as needed for sleep., Disp: , Rfl:   Allergies:  Allergies  Allergen Reactions   Ibuprofen Other (See Comments)    Does not take due to kidney function NSAIDS   Levodopa Other (See Comments)    Increase of dopamine levels   Oxycodone  Other (See Comments)    Pt cannot tolerate oxycodone - makes her loopy/ AMS    Past Medical History, Surgical history, Social history, and Family History were reviewed and updated.  Review of Systems: Review of Systems  Constitutional: Negative.   HENT:  Negative.    Eyes: Negative.   Respiratory: Negative.    Cardiovascular: Negative.   Gastrointestinal: Negative.   Endocrine: Negative.   Genitourinary: Negative.    Musculoskeletal: Negative.   Skin: Negative.   Neurological: Negative.   Hematological: Negative.   Psychiatric/Behavioral: Negative.      Physical Exam: Vital signs are temperature of 98.3.  Pulse 70.  Blood pressure 125/58.  Weight is 239 pounds.      Wt Readings from Last 3 Encounters:  11/19/24 239 lb (108.4 kg)  08/20/24 231 lb 1.9 oz (104.8 kg)  06/04/24 230 lb (104.3 kg)    Physical Exam Vitals reviewed.  Constitutional:      Comments: Her breast exam shows the right mastectomy.  This is healing.  She has no erythema or warmth.  There is no skin breakdown.  There is no right axillary adenopathy.  Left breast shows lumpectomy scar at the 12 o'clock position.  There is no mass in the left breast.  There is no  double discharge.  There is no left axillary adenopathy.  HENT:     Head: Normocephalic and atraumatic.  Eyes:     Pupils: Pupils are equal, round, and reactive to light.  Cardiovascular:     Rate and Rhythm: Normal rate and regular rhythm.     Heart sounds: Normal heart sounds.  Pulmonary:     Effort: Pulmonary effort is normal.  Breath sounds: Normal breath sounds.  Abdominal:     General: Bowel sounds are normal.     Palpations: Abdomen is soft.  Musculoskeletal:        General: No tenderness or deformity. Normal range of motion.     Cervical back: Normal range of motion.  Lymphadenopathy:     Cervical: No cervical adenopathy.  Skin:    General: Skin is warm and dry.     Findings: No erythema or rash.  Neurological:     Mental Status: She is alert and oriented to person, place, and time.  Psychiatric:        Behavior: Behavior normal.        Thought Content: Thought content normal.        Judgment: Judgment normal.      Lab Results  Component Value Date   WBC 6.6 11/19/2024   HGB 13.0 11/19/2024   HCT 39.2 11/19/2024   MCV 101.8 (H) 11/19/2024   PLT 194 11/19/2024     Chemistry      Component Value Date/Time   NA 139 11/19/2024 0839   K 4.1 11/19/2024 0839   CL 103 11/19/2024 0839   CO2 24 11/19/2024 0839   BUN 35 (H) 11/19/2024 0839   CREATININE 1.69 (H) 11/19/2024 0839      Component Value Date/Time   CALCIUM  10.0 11/19/2024 0839   ALKPHOS 90 11/19/2024 0839   AST 21 11/19/2024 0839   ALT 16 11/19/2024 0839   BILITOT 0.7 11/19/2024 0839      Impression and Plan: Ms. Willcox is a very nice 78 year old postmenopausal white female.  She has a second breast cancer.  The first breast cancer was about 32 years ago.  She underwent chemotherapy and radiation after her lumpectomy.  She now has a second breast cancer.  She had a mastectomy.  She has a high Oncotype score.  We gave aggressive chemotherapy.  She had 4 cycles of Taxotere /Cytoxan .  She  completed this back in April 2025.  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  She is on Eliquis .  She is responding by her CT angiogram and by the Doppler of her left leg.  I suspect that she will be on Eliquis  for quite a while.  Hopefully, she will have no problems with the pacemaker.  We will now plan to get her back hopefully in the Springtime of 2026.   Kristin JONELLE Crease, MD 12/12/202510:11 AM

## 2024-11-19 NOTE — Patient Instructions (Signed)

## 2024-11-21 ENCOUNTER — Other Ambulatory Visit: Payer: Self-pay

## 2024-12-31 ENCOUNTER — Encounter: Payer: Self-pay | Admitting: Hematology & Oncology

## 2025-01-07 ENCOUNTER — Inpatient Hospital Stay: Attending: Hematology & Oncology

## 2025-01-07 DIAGNOSIS — Z452 Encounter for adjustment and management of vascular access device: Secondary | ICD-10-CM | POA: Insufficient documentation

## 2025-01-07 DIAGNOSIS — C50911 Malignant neoplasm of unspecified site of right female breast: Secondary | ICD-10-CM | POA: Insufficient documentation

## 2025-01-07 NOTE — Patient Instructions (Signed)

## 2025-02-17 ENCOUNTER — Inpatient Hospital Stay

## 2025-02-17 ENCOUNTER — Inpatient Hospital Stay: Admitting: Hematology & Oncology
